# Patient Record
Sex: Female | Born: 1935 | Race: White | Hispanic: No | Marital: Married | Smoking: Never smoker
Health system: Southern US, Community
[De-identification: ages and names within clinical notes are randomized; demographics above are authoritative.]

## PROBLEM LIST (undated history)

## (undated) DIAGNOSIS — G43009 Migraine without aura, not intractable, without status migrainosus: Secondary | ICD-10-CM

## (undated) DIAGNOSIS — I10 Essential (primary) hypertension: Secondary | ICD-10-CM

## (undated) DIAGNOSIS — H3530 Unspecified macular degeneration: Secondary | ICD-10-CM

## (undated) DIAGNOSIS — Z8639 Personal history of other endocrine, nutritional and metabolic disease: Secondary | ICD-10-CM

## (undated) DIAGNOSIS — M810 Age-related osteoporosis without current pathological fracture: Secondary | ICD-10-CM

## (undated) DIAGNOSIS — J45990 Exercise induced bronchospasm: Secondary | ICD-10-CM

## (undated) DIAGNOSIS — Z72820 Sleep deprivation: Secondary | ICD-10-CM

## (undated) DIAGNOSIS — H9190 Unspecified hearing loss, unspecified ear: Secondary | ICD-10-CM

## (undated) HISTORY — DX: Migraine without aura, not intractable, without status migrainosus: G43.009

## (undated) HISTORY — DX: Essential (primary) hypertension: I10

## (undated) HISTORY — DX: Sleep deprivation: Z72.820

## (undated) HISTORY — DX: Personal history of other endocrine, nutritional and metabolic disease: Z86.39

## (undated) HISTORY — DX: Unspecified macular degeneration: H35.30

## (undated) HISTORY — DX: Unspecified hearing loss, unspecified ear: H91.90

## (undated) HISTORY — PX: TONSILLECTOMY: SUR1361

## (undated) HISTORY — DX: Age-related osteoporosis without current pathological fracture: M81.0

## (undated) HISTORY — DX: Exercise induced bronchospasm: J45.990

## (undated) HISTORY — PX: CATARACT EXTRACTION: SUR2

## (undated) HISTORY — PX: ABDOMINAL HYSTERECTOMY: SHX81

---

## 1988-08-01 HISTORY — PX: BREAST LUMPECTOMY: SHX2

## 2000-07-28 ENCOUNTER — Encounter: Payer: Self-pay | Admitting: Orthopedic Surgery

## 2000-07-31 ENCOUNTER — Inpatient Hospital Stay (HOSPITAL_COMMUNITY): Admission: RE | Admit: 2000-07-31 | Discharge: 2000-08-02 | Payer: Self-pay | Admitting: Orthopedic Surgery

## 2000-07-31 HISTORY — PX: OTHER SURGICAL HISTORY: SHX169

## 2002-09-30 ENCOUNTER — Ambulatory Visit (HOSPITAL_COMMUNITY): Admission: RE | Admit: 2002-09-30 | Discharge: 2002-09-30 | Payer: Self-pay | Admitting: Gastroenterology

## 2004-09-06 HISTORY — PX: PARATHYROIDECTOMY: SHX19

## 2005-06-20 ENCOUNTER — Inpatient Hospital Stay (HOSPITAL_COMMUNITY): Admission: EM | Admit: 2005-06-20 | Discharge: 2005-06-21 | Payer: Self-pay | Admitting: Emergency Medicine

## 2006-04-06 ENCOUNTER — Ambulatory Visit (HOSPITAL_COMMUNITY): Admission: RE | Admit: 2006-04-06 | Discharge: 2006-04-06 | Payer: Self-pay | Admitting: Orthopedic Surgery

## 2007-01-03 ENCOUNTER — Ambulatory Visit: Payer: Self-pay | Admitting: Internal Medicine

## 2007-01-24 ENCOUNTER — Ambulatory Visit (HOSPITAL_COMMUNITY): Admission: RE | Admit: 2007-01-24 | Discharge: 2007-01-24 | Payer: Self-pay | Admitting: Internal Medicine

## 2007-02-23 ENCOUNTER — Ambulatory Visit: Payer: Self-pay | Admitting: Internal Medicine

## 2007-04-09 ENCOUNTER — Ambulatory Visit: Payer: Self-pay | Admitting: Internal Medicine

## 2008-05-20 ENCOUNTER — Encounter: Admission: RE | Admit: 2008-05-20 | Discharge: 2008-05-20 | Payer: Self-pay | Admitting: Family Medicine

## 2009-06-02 ENCOUNTER — Encounter: Admission: RE | Admit: 2009-06-02 | Discharge: 2009-06-02 | Payer: Self-pay | Admitting: Family Medicine

## 2009-06-16 ENCOUNTER — Encounter: Admission: RE | Admit: 2009-06-16 | Discharge: 2009-06-16 | Payer: Self-pay | Admitting: Family Medicine

## 2010-06-08 ENCOUNTER — Encounter: Admission: RE | Admit: 2010-06-08 | Discharge: 2010-06-08 | Payer: Self-pay | Admitting: Family Medicine

## 2010-06-22 ENCOUNTER — Ambulatory Visit: Payer: Self-pay | Admitting: Cardiology

## 2010-07-05 ENCOUNTER — Encounter: Payer: Self-pay | Admitting: Cardiology

## 2010-07-05 ENCOUNTER — Ambulatory Visit (HOSPITAL_COMMUNITY)
Admission: RE | Admit: 2010-07-05 | Discharge: 2010-07-05 | Payer: Self-pay | Source: Home / Self Care | Admitting: Cardiology

## 2010-07-05 ENCOUNTER — Ambulatory Visit: Payer: Self-pay

## 2010-07-05 ENCOUNTER — Encounter: Payer: Self-pay | Admitting: Cardiovascular Disease

## 2010-07-15 ENCOUNTER — Ambulatory Visit: Payer: Self-pay | Admitting: Cardiology

## 2010-12-14 NOTE — Assessment & Plan Note (Signed)
[facility]                             PULMONARY OFFICE NOTE   NAME:[name], [name]                        MRN:          [id number]  DATE:04/09/2007                            DOB:          Feb 09, 1933    HISTORY:  A 77 year old white female, never smoker with evidence of air  flow obstruction by previous PFTs returns today having eliminated beta-  blockers from her regimen, having stopped biphosphonates and treating  herself with reflux medicines daily.  She returns for both blood  pressure check and to consider maintenance therapy for asthma.   The patient denies any significant dyspnea with daily activities but on  the other hand, has reduced her activity level significantly over the  years.  She denies any orthopnea, PND or leg swelling, fevers, chills,  sweats or significant nocturnal complaints of wheezing or cough.   MEDICATIONS:  Presently, her only medications consist of  1. Citracal with D 900 mg daily.  2. Glucosamine chondroitin one daily.  3. Exforge 5/160 one daily.   PHYSICAL EXAMINATION:  VITAL SIGNS:  She is afebrile with stable vital  signs.  GENERAL APPEARANCE:  She is a pleasant, ambulatory white female in no  acute distress.  HEENT:  Unremarkable.  Oropharynx clear.  LUNGS:  Completely clear to auscultation and percussion bilaterally.  CARDIOVASCULAR:  There is regular rhythm without murmurs, rubs, or  gallops.  ABDOMEN:  Soft and benign.  EXTREMITIES:  Warm without calf tenderness, clubbing, cyanosis, or  edema.   PFTs were reviewed with the patient from today and indicate and FEV1 of  only 78% of predicted with a ratio of 51%.   IMPRESSION:  This patient has moderate air flow obstruction at baseline  and is at risk for significant asthma exacerbation.  I believe she has  learned to live with this level of air flow obstruction, typical of an  emphysemic patient and needs to be treated more aggressively for asthma.  She  was not inclined to do so, feeling much better than she did when  she first came here (which may have been due to the combined effects of  Actonel in the upper airways and beta-blockers in the lower airways.   To sort through this issue, I recommended the following.  1. Try Symbicort 160 two puffs b.i.d. (extra time spent teaching her      technique) for at least a week to see to what extent she improves.      If we can convince her she improves at that point, I am going to      ask her to continue the medicines indefinitely. Another option      would be to use the medicine the way Europeans do and take it      p.r.n., although this would certainly not be ideal from my      perspective.  2. If she begins to feel significantly improved in terms of her      symptoms, then she can be rechallenged with biphosphonates,      although my strong recommendation is that  she use the one monthly      (ideally once yearly) form of biphosphonate therapy.   I will be happy to see the patient back here if she has any breakthrough  symptoms of coughing, wheezing or dyspnea on the above regimen, but have  deferred all of her regular follow-up including hypertension, which is  well controlled on Exforge, to [facility].   Extended discussion with this patient lasted 15-20 minutes during which  time MDI technique was instructed and mastered by the patient.     Charlaine Dalton. Sherene Sires, MD, [facility]  Electronically Signed    MBW/MedQ  DD: 04/09/2007  DT: 04/10/2007  Job #: [id number]   cc:   Sadie Haber [facility]

## 2010-12-14 NOTE — Assessment & Plan Note (Signed)
[facility]                             PULMONARY OFFICE NOTE   NAME:Biehler, Rorey E                        MRN:          [id number]  DATE:02/23/2007                            DOB:          July 10, 1933    HISTORY:  This is a 77 year old white female never smoker with a history  of asthma as a child now presenting with dyspnea both at rest with the  use of voice and also with exertion. She reported that she improved  either with Primatene or if she just rested and so I was not sure that  she actually had asthma. However, on PFTs she had moderate airflow  obstruction at baseline and a very positive methacholine challenge test.  However, even after bronchodilators, her FEV1 was only 1.40.   She tells me however that she is feeling clinically much better since  she reduced her Metoprolol and eliminated Actonel from her regimen at my  recommendation on 6/4 (this was a short term recommendation only to see  to what extent pseudo-asthma might be playing a role). She denies  exertional chest pain, orthopnea, PND, or leg swelling.   PHYSICAL EXAMINATION:  GENERAL:  She is a pleasant ambulatory white  female in no acute distress.  VITAL SIGNS:  Stable.  HEENT:  Unremarkable. Oropharynx clear.  LUNGS:  Fields reveal pseudo wheeze only. When I asked her to quit  making that voice with your voice if you can, she stopped.  HEART:  Regular rate and rhythm with no murmurs, rubs, or gallops.  ABDOMEN:  Soft and benign.  EXTREMITIES:  Warm without calf tenderness, cyanosis, clubbing, or  edema.   IMPRESSION:  A 15 to 20 minute discussion was held regarding the  following topics:   This patient clearly has airflow obstruction, but probably it is so  chronic that she has really learned to deal with it by limiting her  ventilatory demand. In this setting, a beta blocker should probably be  avoided if possible, so I have given a prescription for Exforge 5/160  and  asked her to stop both Amlodipine and taper off of Metoprolol.  Should she have any sensation of tachycardia or palpitations as she  tapers Metoprolol, I have asked her to go back to the lowest dose of  Metoprolol that controls this.   When I saw her back in six weeks, we will do another set of baseline  PFTs and if indeed she still does have airflow obstruction we will  consider adding an inhaled steroid or a combination product. The problem  with combination products in general and beta agonists in particular, is  that we may find ourselves back where she started, namely with  palpitations (she tells me that is why she had to gradually more and  more Metoprolol anyway, but I believe that was while she was using  inhaled beta agonists).   I spent extra time discussing this with her and her husband openly and  in detail emphasizing that our goal is to optimize her lung function and  minimize the number of medications  she has to take.     Charlaine Dalton. Sherene Sires, MD, [facility]  Electronically Signed    MBW/MedQ  DD: 02/24/2007  DT: 02/25/2007  Job #: [id number]   cc:   Al Decant. Janey Greaser, MD
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
## 2010-12-17 NOTE — H&P (Signed)
[facility]  Patient:    [name], [name] [facility]                          MRN: [id number] Proc. Date: 07/28/00 Adm. Date:  07/31/00 Attending:  Fayrene Fearing P. Aplington, M.D. Dictator:   Della Goo, P.A.-C.                         History and Physical  CHIEF COMPLAINT:  Left shoulder pain.  HISTORY OF PRESENT ILLNESS:  Ms. [name] is a 77 year old white female, with several months history of left shoulder pain.  The left shoulder pain began approximately April of this year when she was doing some chores and reaching backwards and stretching.  She did feel a slight click or pop in the shoulder area, and for several days had muscular discomfort.  She was seen by her medical doctor and treated with Celebrex b.i.d. with little relief.  Her shoulder pain became progressively worse and she noted increasing dysfunction of the left shoulder.  She eventually was seen in Dr. Dawayne Cirri office, and at that time noted to have slight loss of both internal and external rotation actively of the left shoulder.  X-rays of the left shoulder looked fairly normal; however, due to her continued symptoms of pain and ongoing dysfunction, she was sent for an MRI to rule out rotator cuff tear.  Her MRI results did reveal a full-thickness rotator cuff tear on the left with mild retraction.  Due to these significant findings, as well as her continued symptoms, it was felt she would require surgical intervention, and is now being admitted to undergo open repair of the left rotator cuff tear.  ALLERGIES:  PENICILLIN and ERYTHROMYCIN; both cause itching and swelling.  CURRENT MEDICATIONS:  Climara patch.  PAST MEDICAL HISTORY:  The patient states she has had fairly good health throughout her lifetime.  She has recently had a flu syndrome for approximately two weeks and is now recovering.  She has a history of cluster type headaches and has undergone a full evaluation at [facility]. Currently, she uses over-the-counter medication as needed for her headaches.  PAST SURGICAL HISTORY:  Includes tonsillectomy and hysterectomy many years ago.  FAMILY MEDICAL DOCTOR:  Al Decant. Janey Greaser, M.D.  SOCIAL HISTORY:  The patient lives with her husband.  She is a retired Midwife.  She has no intake of alcohol or tobacco.  She is usual independent with activities of daily living, and ordinarily a quite active individual.  FAMILY HISTORY:  The patients father died of an MI.  He also had a history of Parkinsons disease.  The patients mother died of renal failure, and also had a tumor on her parathyroid.  REVIEW OF SYSTEMS:  CENTRAL NERVOUS SYSTEM:  The patient denies blurred vision, double vision, seizure disorder, headaches, or paralysis. CARDIORESPIRATORY:  No chest pain, shortness of breath.  No cough with sputum production; however, she does have some slight cough, mostly left from her previous flu symptoms.  She has no hemoptysis. GENITOURINARY/GASTROINTESTINAL:  No nausea, vomiting, diarrhea, or constipation.  No dysuria, hematuria, melena, or bloody stools. MUSCULOSKELETAL:  As per history of present illness.  HEMATOLOGIC:  The patient denies history of juandice, hepatitis, anemia, bleeding tendencies, or blood clots.  PHYSICAL EXAMINATION:  VITAL SIGNS:  Temperature is afebrile, pulse 72 and regular.  Blood pressure 128/78.  GENERAL:  She is a very pleasant, well-developed, well-nourished  white female, alert and oriented x 4.  She appears somewhat younger than her stated age.  HEENT:  Normocephalic, atraumatic.  Pupils, equal, round, reactive to light and accommodation.  Extraocular movements intact.  Nose without drainage. Oropharynx without edema or erythema.  NECK:  Supple.  No adenopathy or thyromegaly.  LUNGS:  Clear to auscultation.  HEART:  Regular rate and rhythm.  No murmur heard.  ABDOMEN:  Soft, nontender, bowel sounds  present.  GENITALIA/RECTAL/BREASTS:  Examinations not performed, not pertinent to present illness.  EXTREMITIES:  Examination of the left shoulder reveals loss of internal external rotation actively with the left shoulder.  She has mild tenderness at the [facility] joint.  There is very mild weakness with rotator cuff testing of the left shoulder.  Upper extremities showed neurovascular and motor function intact otherwise.  Lower extremities with neurovascular and motor function grossly intact.  IMPRESSION: 1. Rotator cuff tear, left shoulder. 2. History of cluster headaches. 3. Recent flu syndrome.  PLAN:  The patient will be admitted to the hospital to undergo rotator cuff repair of the left shoulder. DD:  07/28/00 TD:  07/29/00 Job: [id number]
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
## 2010-12-17 NOTE — Discharge Summary (Signed)
[facility]  Patient:    [name], [name]               MRN: [id number] Adm. Date:  07/31/00 Disc. Date: 08/02/00 Attending:  Fayrene Fearing P. Aplington, M.D. Dictator:   Druscilla Brownie. Shela Nevin, P.A. CC:         Al Decant. Janey Greaser, M.D.   Discharge Summary  ADMISSION DIAGNOSES: 1. Rotator cuff tear left shoulder. 2. History of cluster headaches. 3. Recent flu syndrome.  DISCHARGE DIAGNOSES: 1. Rotator cuff tear left shoulder. 2. History of cluster headaches. 3. Recent flu syndrome.  OPERATIONS:  On July 31, 2000, the patient underwent anterior acromionectomy repair of torn rotator cuff of the left shoulder.  CONSULTATIONS:  None.  HISTORY OF PRESENT ILLNESS:  This 77 year old lady has had pain and problems of the left shoulder since April 2001.  They have become progressively worse over the months.  MRI showed a full-thickness tear of the anterior rotator cuff.  She had an extensive subdeltoid bursitis and tendinopathy.  Due to the increasing problems she had with use of this left arm, it was felt that she would benefit from the above procedure and was admitted for same.  HOSPITAL COURSE:  The patient tolerated the surgical procedure quite well. Was placed on PCA morphine postoperatively for pain control.  Eventually we were able to wean her from the IV analgesics to p.o. medications, and this seemed to do the job quite well.  On the morning of discharge she was afebrile, vital signs were stable, wound was dry, and Steri-Strips were intact to the left shoulder.  It was felt she could be maintained in the home environment.  Instructions were given both to her and her husband, and she was discharged home.  LABORATORY DATA:  Hematologically showed a CBC with differential completely within normal limits.  Blood chemistries were also normal.  Urinalysis was negative for urinary tract infection.  Chest x-ray showed COPD with no definite acute  process.  EKG showed normal sinus rhythm, left atrial enlargement.  CONDITION ON DISCHARGE:  Improved and stable.  PLAN:  The patient is discharged to her home in the care of her family.  She is urged to relax the shoulder musculature, particularly on the left side. She is to wear the sling at all times.  She may shower in about three to four days.  Use dry dressings p.r.n.  FOLLOW-UP:  Return to the center two weeks after the date of surgery.  DISCHARGE MEDICATIONS: 1. Robaxin 500 mg #40 with a refill, 1 q.6h. p.r.n. muscle spasm. 2. Vicodin 5 mg #40 with a refill, 1-2 q.4-6h. p.r.n. pain.  DISCHARGE INSTRUCTIONS:  Call with any problems. DD:  08/02/00 TD:  08/02/00 Job: [id number]
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
## 2010-12-17 NOTE — Op Note (Signed)
[facility]  Patient:    [name], [name]                MRN: [id number] Proc. Date: 07/31/00 Adm. Date:  [id number] Attending:  Marlowe Kays Page                           Operative Report  PREOPERATIVE DIAGNOSIS:  Full-thickness rotator cuff tear, left shoulder.  POSTOPERATIVE DIAGNOSIS:  Full-thickness rotator cuff tear, left shoulder.  OPERATION:  Anterior acromionectomy and repair of torn rotator cuff, left shoulder.  SURGEON:  Illene Labrador. Aplington, M.D.  ASSISTANT:  Dorie Rank, P.A.C.  ANESTHESIA:  General.  PATHOLOGY AND JUSTIFICATION FOR PROCEDURE:  She has had pain in the left shoulder since April of this year which has become progressively more severe. I saw her on June 02, 2000 in the office and subsequently ordered an MRI of the left shoulder which demonstrated a full-thickness tear of the anterior rotator cuff associated with extensive subdeltoid bursitis and severe tendinopathy.  All these findings were confirmed at surgery.  DESCRIPTION OF PROCEDURE:  With prophylactic antibiotics, satisfactory general anesthesia, placed on the ______ frame in the beach chair position.  Left shoulder girdle was prepped with Duraprep and draped into a sterile field, Ioban employed, vertical incision from roughly the [facility] joint down to the greater tuberosity, opened the fascia over the anterior acromion in line with the skin incision and dissected anteriorly and posteriorly, splitting the deltoid for a centimeter or so.  I then identified the [facility] joint with a Mellody Dance needle and protecting it, marked out my initial line for anterior acromionectomy.  I placed a Cobb elevator beneath the anterior acromion and made my first osteotomy, with additional underneath surface of acromial decompression subsequently to fully decompress the rotator cuff.  I performed a partial subdeltoid bursectomy.  The tear was identified and after evaluating the best method  for repair, felt that I could get a good side-to-side repair without having to reattach it to the bones through drill holes; consequently, working first up underneath the residual anterior acromion, I began a nice side-to-side repair with #1 Ethibond, working laterally down to the greater tuberosity.  This gave a nice stable repair and I checked for impingement and there was no longer any impingement problem.  The wound was then irrigated with sterile saline.  Soft tissue was infiltrated with 0.5% Marcaine with Adrenalin.  The deltoid muscle was closed in two layers with interrupted #1 Vicryl.  The fascia over the anterior acromion was closed with interrupted #1 Vicryl as well, with a nice tight repair.  Subcutaneous tissue was closed with 2-0 Vicryl and skin with Steri-Strips.  A dry sterile dressing and short immobilizer were applied.  At the time of this dictation, she was on the way to the recovery room in satisfactory condition with no known complications. DD:  07/31/00 TD:  07/31/00 Job: [id number]

## 2010-12-17 NOTE — Op Note (Signed)
[name], Young                 ACCOUNT NO.:  [id number]   MEDICAL RECORD NO.:  [id number]          PATIENT TYPE:  AMB   LOCATION:  DAY                          [facility]ACILITY[facility]  [facility]   PHYSICIAN:  Marlowe Kays, M.D.  DATE OF BIRTH:  01-06-1933   DATE OF PROCEDURE:  04/06/2006  DATE OF DISCHARGE:                                 OPERATIVE REPORT   PREOPERATIVE DIAGNOSES:  1. Chronic impingement syndrome with rotator cuff tendinopathy.  2. 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   POSTOPERATIVE DIAGNOSES:  1. Chronic impingement syndrome with rotator cuff tendinopathy.  2. 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   OPERATION:  Right shoulder arthroscopy with:  1. Debridement of articular surface of rotator cuff and minor labral      debridement.  2. Arthroscopic subacromial and distal clavicle resection with shaving of      the bursal surface of the rotator cuff.   SURGEON:  Dr. Simonne Come   ASSISTANT:  Mr. Adrian Blackwater.   ANESTHESIA:  General.   PATHOLOGY AND JUSTIFICATION FOR PROCEDURE:  She has had an open rotator cuff  repair in the left shoulder and done well.  In the right shoulder, she had  rotator cuff tendinopathy with some mild to moderate AC joint degenerative  changes.  This does not appear to be a full-thickness rotator cuff tear.  She was not tender at the [facility] joint.  See operative description below for  additional details.   PROCEDURE:  Satisfactory general anesthesia, beach-chair position on the  Allen frame, right shoulder girdle was prepped with DuraPrep and draped in  sterile field.  The anatomy of the shoulder joint was marked out, and the  posterior and lateral portal sites as was the subacromial space infiltrated  with 0.5% Marcaine with adrenalin.  Through a posterior soft spot portal, I  atraumatically entered the glenohumeral joint.  The underneath surface  of  the rotator cuff was somewhat frayed.  Biceps tendon looked to be generally  in good repair.  There was minimal labral abnormality adjacent to the biceps  insertion.  Accordingly, I advanced the scope between the biceps tendon and  subscapularis.  I used a switching stick and made an anterior incision over  which I placed a metal cannula followed by a 4.2 shaver and debrided down  the rotator cuff and the labrum.  Final pictures were taken.  I then removed  all fluid possible from the anterior portal and redirected the scope in the  subacromial space.  Through a lateral portal, I placed a blunt trocar  followed by a 4.2 shaver.  She had a very exuberant subdeltoid bursitis  which I pictured and then began resecting.  When we had adequate working  room, I used the [employer] 9-degree vaporizer to remove soft tissue from  the undersurface of the anterior acromion and the distal clavicle.  I  followed this with a 4-mm oval bur, burring down these areas, went back and  forth between the 4.2 shaver  which I used to debride up roughening of the  rotator cuff the bur and the [employer] vaporizer until we had wide  decompression of both the distal clavicle and the subacromial area.  Confirmatory pictures were taken with his arm to his side and the arm  abducted.  There was no unusual bleeding at the time of completion.  All  fluid possible was removed from the subacromial space.  The 3  portals and the subacromial space were once again reinjected with 0.5%  Marcaine with adrenalin and the portals closed with 4-0 nylon.  Betadine,  Adaptic dry sterile dressing, shoulder immobilizer were applied.  She  tolerated the procedure well and was taken to recovery in satisfactory  condition with no known complications.           ______________________________  Marlowe Kays, M.D.     JA/MEDQ  D:  04/06/2006  T:  04/06/2006  Job:  [id number]

## 2011-06-14 ENCOUNTER — Other Ambulatory Visit: Payer: Self-pay | Admitting: Family Medicine

## 2011-06-14 DIAGNOSIS — M8580 Other specified disorders of bone density and structure, unspecified site: Secondary | ICD-10-CM

## 2011-06-14 DIAGNOSIS — Z1231 Encounter for screening mammogram for malignant neoplasm of breast: Secondary | ICD-10-CM

## 2011-06-21 ENCOUNTER — Ambulatory Visit
Admission: RE | Admit: 2011-06-21 | Discharge: 2011-06-21 | Disposition: A | Discharge status: Home / Self Care | Payer: Self-pay | Source: Ambulatory Visit

## 2011-06-21 ENCOUNTER — Ambulatory Visit
Admission: RE | Admit: 2011-06-21 | Discharge: 2011-06-21 | Disposition: A | Discharge status: Home / Self Care | Payer: Self-pay | Source: Ambulatory Visit | Attending: Family Medicine | Admitting: Family Medicine

## 2011-06-21 ENCOUNTER — Ambulatory Visit: Payer: Self-pay

## 2011-06-21 DIAGNOSIS — M8580 Other specified disorders of bone density and structure, unspecified site: Secondary | ICD-10-CM

## 2011-06-21 DIAGNOSIS — Z1231 Encounter for screening mammogram for malignant neoplasm of breast: Secondary | ICD-10-CM

## 2011-10-12 ENCOUNTER — Encounter: Payer: Self-pay | Admitting: *Deleted

## 2011-10-12 DIAGNOSIS — I10 Essential (primary) hypertension: Secondary | ICD-10-CM

## 2011-10-12 DIAGNOSIS — J45990 Exercise induced bronchospasm: Secondary | ICD-10-CM | POA: Insufficient documentation

## 2011-10-12 DIAGNOSIS — H353230 Exudative age-related macular degeneration, bilateral, stage unspecified: Secondary | ICD-10-CM | POA: Insufficient documentation

## 2011-10-12 DIAGNOSIS — H3530 Unspecified macular degeneration: Secondary | ICD-10-CM | POA: Insufficient documentation

## 2012-01-04 ENCOUNTER — Encounter: Payer: Self-pay | Admitting: Cardiology

## 2012-03-15 ENCOUNTER — Encounter: Payer: Self-pay | Admitting: *Deleted

## 2012-03-29 ENCOUNTER — Ambulatory Visit (INDEPENDENT_AMBULATORY_CARE_PROVIDER_SITE_OTHER): Payer: Medicare Other | Admitting: Cardiology

## 2012-03-29 ENCOUNTER — Encounter: Payer: Self-pay | Admitting: Cardiology

## 2012-03-29 VITALS — BP 171/77 | HR 64 | Ht 63.0 in | Wt 127.0 lb

## 2012-03-29 DIAGNOSIS — I10 Essential (primary) hypertension: Secondary | ICD-10-CM

## 2012-03-29 DIAGNOSIS — R002 Palpitations: Secondary | ICD-10-CM

## 2012-03-29 MED ORDER — AMLODIPINE BESYLATE 2.5 MG PO TABS: ORAL_TABLET | ORAL | Status: DC

## 2012-03-29 MED ORDER — AMLODIPINE BESYLATE-VALSARTAN 5-160 MG PO TABS: ORAL_TABLET | ORAL | Status: DC

## 2012-03-29 MED ORDER — VALSARTAN 80 MG PO TABS: ORAL_TABLET | ORAL | Status: DC

## 2012-03-29 NOTE — Assessment & Plan Note (Signed)
I think her BP control is reasonable.  She is not getting tired as much on the lower dose of Exforge.  Exforge has been expensive for her.  Today, I gave her prescriptions for amlodipine 2.5 mg daily and valsartan 80 mg daily.  To limit BP fluctuations, I advised her to take one in the morning and one in the evening.

## 2012-03-29 NOTE — Assessment & Plan Note (Signed)
One recent episode where she was tachycardic for about 2 hours.  She had really had no prior episodes for years and has not had symptoms since.  She has known PACs and PVCs by prior holter.  She is not on a beta blocker any longer because of asthma.  Her son has atrial fibrillation and there is a question of a prior atrial fibrillation episode in 2000, but I cannot confirm this.  If she has further episodes of tachypalpitations, I will have her wear a 3 week monitor.  She will call if she has anymore symptoms.    I will see her back in 1 year.  She will have her labs including lipids sent to [place] from her PCP's office when they are done later this year.

## 2012-03-29 NOTE — Progress Notes (Signed)
Patient ID: [name], female   DOB: 06/15/1933, 78 y.o.   MRN: [id number] PCP: Dr. Zachery Dauer  78 yo with history of palpitations and HTN presents for cardiology followup.  She has seen Dr. Deborah Chalk in the past and is seeing me for the first time today.  She was diagnosed by holter with PACs and PVCs back in 2000.  She really has not had much trouble with palpitations since that time.  However, about a week ago she felt her heart beating fast for about 2 hours.  She checked her pulse several times and HR was in the 100s-110s.  The episode spontaneously resolved and has not returned.  She had not felt an episode like this before.  No lightheadedness/presyncope.    Her main concern today is her BP regimen.  Not long ago, she thought that her BP was getting too low. She would have occasional readings in the 90s-100s systolic and would feel tired when BP would be low. Therefore, her Exforge was cut in half so she is taking amlodipine/valsartan 2.5/80 at this time.  She brings in her recent readings and is concerned they are fluctuating too much.  However, systolic is mostly less than 140 and I do not see any systolic readings recently < around 110.  Therefore, I think that her control is reasonable.  BP is high today but she did not take her medication this morning.   No chest pain.  No exertional dyspnea: asthma seems to be controlled with Symbicort.    ECG: NSR, normal   PMH: 1. Palpitations: Holter monitor in 2011 with PACs and PVCs.  ? Episode of paroxysmal atrial fibrillation in 2000.   2. Echo (2011) with EF 55-60%, no significant valvular abnormalities.  3. Hysterectomy 1985 4. Parathyroid surgery 2006. 5. Right breast lumpectomy 1990. 6. H/o rotator cuff surgery.  7. HTN 8. Asthma with exercise-induced component.  Was taken off beta blocker because of asthma.  9. Macular degeneration.   SH: Married, lives in [place].  She is a retired Runner, broadcasting/film/video with 2 sons.  No smoking.   FH: Father died  of heart disease at 36.  One of her sons has been diagnosed with atrial fibrillation.   ROS: All systems reviewed and negative except as per HPI.   Current Outpatient Prescriptions  Medication Sig Dispense Refill  . albuterol (VENTOLIN HFA) 108 (90 BASE) MCG/ACT inhaler Inhale 2 puffs into the lungs every 6 (six) hours as needed.      Marland Kitchen aspirin 81 MG tablet Take 81 mg by mouth daily.      . B Complex Vitamins (VITAMIN B COMPLEX PO) Take 1 tablet by mouth every other day.      . budesonide-formoterol (SYMBICORT) 160-4.5 MCG/ACT inhaler Inhale 2 puffs into the lungs daily.      . calcium citrate-vitamin D (CITRACAL+D) 315-200 MG-UNIT per tablet Take 1 tablet by mouth daily.      . cholecalciferol (VITAMIN D) 1000 UNITS tablet Take 1,000 Units by mouth daily.      . fish oil-omega-3 fatty acids 1000 MG capsule Take 4 g by mouth daily.      . Glucos-Chond-Sterol-Fish Oil (GLUCOSAMINE CHONDROITIN PLUS PO) Take 2 tablets by mouth daily.      . Phosphatidylserine (NEURO-PS PO) Take 1 tablet by mouth daily.      . vitamin C (ASCORBIC ACID) 500 MG tablet Take 500 mg by mouth daily.      . Zolpidem Tartrate (AMBIEN PO) Take by mouth.      Marland Kitchen  DISCONTD: amLODipine-valsartan (EXFORGE) 5-160 MG per tablet Take 1 tablet by mouth daily.      Marland Kitchen amLODipine (NORVASC) 2.5 MG tablet 1 daily in the morning  90 tablet  3  . amLODipine-valsartan (EXFORGE) 5-160 MG per tablet 1/2 tablet daily  45 tablet  3  . valsartan (DIOVAN) 80 MG tablet 1 daily in the evening  90 tablet  3    BP 171/77  Pulse 64  Ht 5\' 3"  (1.6 m)  Wt 127 lb (57.607 kg)  BMI 22.50 kg/m2 General: NAD Neck: No JVD, no thyromegaly or thyroid nodule.  Lungs: Clear to auscultation bilaterally with normal respiratory effort. CV: Nondisplaced PMI.  Heart regular S1/S2, no S3/S4, no murmur.  No peripheral edema.  No carotid bruit.  Normal pedal pulses.  Abdomen: Soft, nontender, no hepatosplenomegaly, no distention.  Skin: Intact without lesions  or rashes.  Neurologic: Alert and oriented x 3.  Psych: Normal affect. Extremities: No clubbing or cyanosis.  HEENT: Normal.

## 2012-03-29 NOTE — Patient Instructions (Addendum)
I have given you a prescription for Exforge 5/160mg  to take 1/2 tablet daily.  I have also given you a prescription for amlodipine 2.5mg  daily in the morning and valsartan 80mg  daily in the evening.   You should take either Exforge 5/160mg  one-half tablet daily OR  amlodipine 2.5mg  1 daily in the morning and valsartan 80mg  1 daily in the evening.   Call the office with your blood pressure readings  if you change to amlodipine 2.5mg  and valsartan 80mg  daily instead of Exforge.  Call if you have an increase in palpitations.  Your physician wants you to follow-up in: 1 year with Dr Shirlee Latch. (August 2014). You will receive a reminder letter in the mail two months in advance. If you don't receive a letter, please call our office to schedule the follow-up appointment.

## 2012-03-30 ENCOUNTER — Other Ambulatory Visit: Payer: Self-pay | Admitting: Family Medicine

## 2012-03-30 DIAGNOSIS — Z139 Encounter for screening, unspecified: Secondary | ICD-10-CM

## 2012-06-15 ENCOUNTER — Ambulatory Visit (INDEPENDENT_AMBULATORY_CARE_PROVIDER_SITE_OTHER): Payer: Medicare Other | Admitting: Family Medicine

## 2012-06-15 ENCOUNTER — Encounter: Payer: Self-pay | Admitting: Family Medicine

## 2012-06-15 VITALS — BP 164/78 | HR 64 | Ht 63.0 in | Wt 126.0 lb

## 2012-06-15 DIAGNOSIS — K648 Other hemorrhoids: Secondary | ICD-10-CM

## 2012-06-15 DIAGNOSIS — E559 Vitamin D deficiency, unspecified: Secondary | ICD-10-CM

## 2012-06-15 DIAGNOSIS — I10 Essential (primary) hypertension: Secondary | ICD-10-CM

## 2012-06-15 DIAGNOSIS — R002 Palpitations: Secondary | ICD-10-CM

## 2012-06-15 DIAGNOSIS — J449 Chronic obstructive pulmonary disease, unspecified: Secondary | ICD-10-CM | POA: Insufficient documentation

## 2012-06-15 NOTE — Patient Instructions (Addendum)
Keep follow-up appointment

## 2012-06-15 NOTE — Progress Notes (Signed)
Subjective:    Patient ID: [name], female    DOB: 08-05-1932, 78 y.o.   MRN: [id number]  HPI She has been living in [place] for years.  Saw [place] docs for years.  They now live about 10 min from her.  They decided good time to make a change.    Dx with COPD and has hx of asthma, but never really has asthma flares. On symbicort 160. Saw Dr. Sherene Sires and has been released. She is currently taking Symbicort and doing well on it. She's had no recent exacerbations. She's happy with her regimen.  She does have a history of vitamin D deficiency. She was told to take 1000 units daily. Her level has not been rechecked recently.  Hypertension-no chest pain or shortness of breath or dizziness recently. She reports that she takes her medications regularly. Recently 2 of her medications were combined to ask for. She's only been taking half a tab because her blood pressure was going too low on a whole tab. Home blood pressures typically run between 120 130. She says his blood pressures very unusual for her. She is fasting today. She would like to go ahead and get her blood work and a she schedule physical for next week.   Review of Systems  Constitutional: Negative for fever, diaphoresis and unexpected weight change.  HENT: Negative for hearing loss, rhinorrhea and tinnitus.   Eyes: Negative for visual disturbance.  Respiratory: Negative for cough and wheezing.   Cardiovascular: Negative for chest pain and palpitations.  Gastrointestinal: Negative for nausea, vomiting, diarrhea and blood in stool.  Genitourinary: Negative for vaginal bleeding, vaginal discharge and difficulty urinating.  Musculoskeletal: Positive for joint swelling and arthralgias. Negative for myalgias.  Skin: Negative for rash.  Neurological: Positive for headaches.  Hematological: Negative for adenopathy. Does not bruise/bleed easily.  Psychiatric/Behavioral: Positive for sleep disturbance. Negative for dysphoric mood. The patient is not  nervous/anxious.     BP 164/78  Pulse 64  Ht 5\' 3"  (1.6 m)  Wt 126 lb (57.153 kg)  BMI 22.32 kg/m2    Allergies  Allergen Reactions  . Erythromycin Swelling  . Penicillins Swelling and Rash    Past Medical History  Diagnosis Date  . HTN (hypertension)   . Asthma, exercise induced   . Macular degeneration   . OP (osteoporosis)   . Difficulty hearing   . Problems related to lack of adequate sleep   . History of parathyroid disease     Past Surgical History  Procedure Date  . Thyroid surgery     Minimally invasive parathyroid surgery by Dr. Bonita Quin in [place], [place].  . Anterior acromionectomy 07/31/00    left  . Repair of torn rotator cuff 07/31/00    left  . Abdominal hysterectomy   . Breast lumpectomy 1990  . Cataract extraction [id number],[id number]    History   Social History  . Marital Status: Married    Spouse Name: N/A    Number of Children: N/A  . Years of Education: N/A   Occupational History  . School Teacher    Social History Main Topics  . Smoking status: Never Smoker   . Smokeless tobacco: Not on file  . Alcohol Use: No  . Drug Use: No  . Sexually Active: Yes   Other Topics Concern  . Not on file   Social History Narrative  . No narrative on file    Family History  Problem Relation Age of Onset  . Heart disease  Father     Outpatient Encounter Prescriptions as of 06/15/2012  Medication Sig Dispense Refill  . albuterol (VENTOLIN HFA) 108 (90 BASE) MCG/ACT inhaler Inhale 2 puffs into the lungs every 6 (six) hours as needed.      Marland Kitchen amLODipine-valsartan (EXFORGE) 5-160 MG per tablet 1/2 tablet daily  45 tablet  3  . aspirin 81 MG tablet Take 81 mg by mouth daily.      . B Complex Vitamins (VITAMIN B COMPLEX PO) Take 1 tablet by mouth every other day.      . budesonide-formoterol (SYMBICORT) 160-4.5 MCG/ACT inhaler Inhale 2 puffs into the lungs daily.      . calcium citrate-vitamin D (CITRACAL+D) 315-200 MG-UNIT per tablet Take 1 tablet by mouth  daily.      . cholecalciferol (VITAMIN D) 1000 UNITS tablet Take 1,000 Units by mouth daily.      . fish oil-omega-3 fatty acids 1000 MG capsule Take 4 g by mouth daily.      . Glucos-Chond-Sterol-Fish Oil (GLUCOSAMINE CHONDROITIN PLUS PO) Take 2 tablets by mouth daily.      . Phosphatidylserine (NEURO-PS PO) Take 1 tablet by mouth daily.      . vitamin C (ASCORBIC ACID) 500 MG tablet Take 500 mg by mouth daily.      Marland Kitchen zolpidem (AMBIEN) 5 MG tablet Take 2.5-5 mg by mouth at bedtime as needed. #30/2 mo      . [DISCONTINUED] amLODipine (NORVASC) 2.5 MG tablet 1 daily in the morning  90 tablet  3  . [DISCONTINUED] valsartan (DIOVAN) 80 MG tablet 1 daily in the evening  90 tablet  3  . [DISCONTINUED] Zolpidem Tartrate (AMBIEN PO) Take by mouth.              Objective:   Physical Exam  Constitutional: She is oriented to person, place, and time. She appears well-developed and well-nourished.  HENT:  Head: Normocephalic and atraumatic.  Cardiovascular: Normal rate, regular rhythm and normal heart sounds.   Pulmonary/Chest: Effort normal and breath sounds normal.  Neurological: She is alert and oriented to person, place, and time.  Skin: Skin is warm and dry.  Psychiatric: She has a normal mood and affect. Her behavior is normal.          Assessment & Plan:  HTN - uncontrolled today. It sounds like her home blood pressures look great. We will recheck her pressure in one week when she follows up. Check CMP and fasting lipid panel today.  COPD-she's stable on her current regimen of Symbicort.  Vitamin D deficiency-recheck levels today. She's currently taking 1000 international units daily.

## 2012-06-16 ENCOUNTER — Encounter: Payer: Self-pay | Admitting: Family Medicine

## 2012-06-16 DIAGNOSIS — C44621 Squamous cell carcinoma of skin of unspecified upper limb, including shoulder: Secondary | ICD-10-CM | POA: Insufficient documentation

## 2012-06-16 DIAGNOSIS — N6019 Diffuse cystic mastopathy of unspecified breast: Secondary | ICD-10-CM | POA: Insufficient documentation

## 2012-06-16 LAB — LIPID PANEL
Cholesterol: 229 mg/dL — ABNORMAL HIGH (ref 0–200)
HDL: 74 mg/dL (ref >39)
LDL Cholesterol: 141 mg/dL — ABNORMAL HIGH (ref 0–99)
Total CHOL/HDL Ratio: 3.1 Ratio
Triglycerides: 71 mg/dL (ref <150)
VLDL: 14 mg/dL (ref 0–40)

## 2012-06-16 LAB — COMPLETE METABOLIC PANEL WITH GFR
ALT: 17 U/L (ref 0–35)
AST: 21 U/L (ref 0–37)
Albumin: 4.4 g/dL (ref 3.5–5.2)
Alkaline Phosphatase: 68 U/L (ref 39–117)
Calcium: 9.8 mg/dL (ref 8.4–10.5)
Chloride: 105 mEq/L (ref 96–112)
Creat: 0.76 mg/dL (ref 0.50–1.10)
GFR, Est African American: 88 mL/min
GFR, Est Non African American: 76 mL/min
Glucose, Bld: 85 mg/dL (ref 70–99)
Potassium: 4.2 mEq/L (ref 3.5–5.3)
Sodium: 144 mEq/L (ref 135–145)
Total Bilirubin: 0.7 mg/dL (ref 0.3–1.2)
Total Protein: 6.5 g/dL (ref 6.0–8.3)

## 2012-06-16 LAB — CBC WITH DIFFERENTIAL/PLATELET
Basophils Relative: 1 % (ref 0–1)
Eosinophils Absolute: 0.1 10*3/uL (ref 0.0–0.7)
Hemoglobin: 14.4 g/dL (ref 12.0–15.0)
Lymphocytes Relative: 28 % (ref 12–46)
Lymphs Abs: 1.6 10*3/uL (ref 0.7–4.0)
MCH: 29.6 pg (ref 26.0–34.0)
MCHC: 33.4 g/dL (ref 30.0–36.0)
MCV: 88.5 fL (ref 78.0–100.0)
Monocytes Absolute: 0.6 10*3/uL (ref 0.1–1.0)
Monocytes Relative: 10 % (ref 3–12)
Neutro Abs: 3.5 10*3/uL (ref 1.7–7.7)
Neutrophils Relative %: 59 % (ref 43–77)
Platelets: 258 10*3/uL (ref 150–400)
RBC: 4.87 MIL/uL (ref 3.87–5.11)
RDW: 13.9 % (ref 11.5–15.5)
WBC: 5.8 10*3/uL (ref 4.0–10.5)

## 2012-06-16 LAB — VITAMIN D 25 HYDROXY (VIT D DEFICIENCY, FRACTURES): Vit D, 25-Hydroxy: 51 ng/mL (ref 30–89)

## 2012-06-18 ENCOUNTER — Encounter: Payer: Self-pay | Admitting: *Deleted

## 2012-06-22 ENCOUNTER — Ambulatory Visit (INDEPENDENT_AMBULATORY_CARE_PROVIDER_SITE_OTHER): Payer: Medicare Other | Admitting: Family Medicine

## 2012-06-22 ENCOUNTER — Encounter: Payer: Self-pay | Admitting: Family Medicine

## 2012-06-22 VITALS — BP 143/76 | HR 74 | Ht 63.0 in | Wt 128.0 lb

## 2012-06-22 DIAGNOSIS — Z0000 Encounter for general adult medical examination without abnormal findings: Secondary | ICD-10-CM

## 2012-06-22 NOTE — Progress Notes (Addendum)
Subjective:    [name] is a 78 y.o. female who presents for Medicare Annual/Subsequent preventive examination.  Preventive Screening-Counseling & Management  Tobacco History  Smoking status  . Never Smoker   Smokeless tobacco  . Not on file     Problems Prior to Visit 1. None   Current Problems (verified) Patient Active Problem List  Diagnosis  . HTN (hypertension)  . Asthma, exercise induced  . Macular degeneration  . Palpitations  . Vitamin D deficiency  . Internal hemorrhoid  . COPD (chronic obstructive pulmonary disease)  . Fibrocystic breast changes  . Squamous cell carcinoma of hand    Medications Prior to Visit Current Outpatient Prescriptions on File Prior to Visit  Medication Sig Dispense Refill  . albuterol (VENTOLIN HFA) 108 (90 BASE) MCG/ACT inhaler Inhale 2 puffs into the lungs every 6 (six) hours as needed.      Marland Kitchen amLODipine-valsartan (EXFORGE) 5-160 MG per tablet 1/2 tablet daily  45 tablet  3  . aspirin 81 MG tablet Take 81 mg by mouth daily.      . B Complex Vitamins (VITAMIN B COMPLEX PO) Take 1 tablet by mouth every other day.      . budesonide-formoterol (SYMBICORT) 160-4.5 MCG/ACT inhaler Inhale 2 puffs into the lungs daily.      . calcium citrate-vitamin D (CITRACAL+D) 315-200 MG-UNIT per tablet Take 1 tablet by mouth daily.      . cholecalciferol (VITAMIN D) 1000 UNITS tablet Take 1,000 Units by mouth daily.      . fish oil-omega-3 fatty acids 1000 MG capsule Take 4 g by mouth daily.      . Glucos-Chond-Sterol-Fish Oil (GLUCOSAMINE CHONDROITIN PLUS PO) Take 2 tablets by mouth daily.      . Phosphatidylserine (NEURO-PS PO) Take 1 tablet by mouth daily.      . vitamin C (ASCORBIC ACID) 500 MG tablet Take 500 mg by mouth daily.      Marland Kitchen zolpidem (AMBIEN) 5 MG tablet Take 2.5-5 mg by mouth at bedtime as needed. #30/2 mo        Current Medications (verified) Current Outpatient Prescriptions  Medication Sig Dispense Refill  . albuterol  (VENTOLIN HFA) 108 (90 BASE) MCG/ACT inhaler Inhale 2 puffs into the lungs every 6 (six) hours as needed.      Marland Kitchen amLODipine-valsartan (EXFORGE) 5-160 MG per tablet 1/2 tablet daily  45 tablet  3  . aspirin 81 MG tablet Take 81 mg by mouth daily.      . B Complex Vitamins (VITAMIN B COMPLEX PO) Take 1 tablet by mouth every other day.      . budesonide-formoterol (SYMBICORT) 160-4.5 MCG/ACT inhaler Inhale 2 puffs into the lungs daily.      . calcium citrate-vitamin D (CITRACAL+D) 315-200 MG-UNIT per tablet Take 1 tablet by mouth daily.      . cholecalciferol (VITAMIN D) 1000 UNITS tablet Take 1,000 Units by mouth daily.      . fish oil-omega-3 fatty acids 1000 MG capsule Take 4 g by mouth daily.      . Glucos-Chond-Sterol-Fish Oil (GLUCOSAMINE CHONDROITIN PLUS PO) Take 2 tablets by mouth daily.      . Phosphatidylserine (NEURO-PS PO) Take 1 tablet by mouth daily.      . vitamin C (ASCORBIC ACID) 500 MG tablet Take 500 mg by mouth daily.      Marland Kitchen zolpidem (AMBIEN) 5 MG tablet Take 2.5-5 mg by mouth at bedtime as needed. #30/2 mo  Allergies (verified) Erythromycin and Penicillins   PAST HISTORY  Family History Family History  Problem Relation Age of Onset  . Heart disease Father     Social History History  Substance Use Topics  . Smoking status: Never Smoker   . Smokeless tobacco: Not on file  . Alcohol Use: No     Are there smokers in your home (other than you)? No  Risk Factors Current exercise habits: works in the yard.   Dietary issues discussed: healthy diet   Cardiac risk factors: advanced age (older than 19 for men, 2 for women).  Depression Screen (Note: if answer to either of the following is "Yes", a more complete depression screening is indicated)   Over the past two weeks, have you felt down, depressed or hopeless? No  Over the past two weeks, have you felt little interest or pleasure in doing things? No  Have you lost interest or pleasure in daily life?  No  Do you often feel hopeless? No  Do you cry easily over simple problems? No  Activities of Daily Living In your present state of health, do you have any difficulty performing the following activities?:  Driving? No Managing money?  No Feeding yourself? No Getting from bed to chair? No Climbing a flight of stairs? No Preparing food and eating?: No Bathing or showering? No Getting dressed: No Getting to the toilet? No Using the toilet:No Moving around from place to place: No In the past year have you fallen or had a near fall?:No   Are you sexually active?  Yes  Do you have more than one partner?  No  Hearing Difficulties: Yes Do you often ask people to speak up or repeat themselves? No Do you experience ringing or noises in your ears? Yes Do you have difficulty understanding soft or whispered voices? Yes   Do you feel that you have a problem with memory? No  Do you often misplace items? No  Do you feel safe at home?  No  Cognitive Testing  Alert? Yes  Normal Appearance?Yes  Oriented to person? Yes  Place? Yes   Time? Yes  Recall of three objects?  Yes  Can perform simple calculations? Yes  Displays appropriate judgment?Yes  Can read the correct time from a watch face?Yes   Advanced Directives have been discussed with the patient? Yes  List the Names of Other Physician/Practitioners you currently use: 1.  Dr. Corrin Parker, dermatology #2 Dr. Clearance Coots optometry #3 Dr. Christia Reading, ENT. #4 Dr. Ernest Haber, chiropractor  Indicate any recent Medical Services you may have received from other than [facility] providers in the past year (date may be approximate).  Immunization History  Administered Date(s) Administered  . Influenza Split 05/07/2012  . Pneumococcal Polysaccharide 08/01/2005  . Td 08/01/2001  . Tdap 08/01/2005    Screening Tests Health Maintenance  Topic Date Due  . Mammogram  06/20/2012  . Colonoscopy  09/29/2012  . Influenza Vaccine  04/01/2013  .  Tetanus/tdap  08/02/2015  . Zostavax  11/03/1994  . Pneumococcal Polysaccharide Vaccine Age 66 And Over  Completed    All answers were reviewed with the patient and necessary referrals were made:  Roger Kettles, MD   06/22/2012   History reviewed: allergies, current medications, past family history, past medical history, past social history, past surgical history and problem list  Review of Systems A comprehensive review of systems was negative.    Objective:     Vision by Snellen chart: right eye:20/20, left eye:20/200  Body mass index is 22.67 kg/(m^2). BP 143/76  Pulse 74  Ht 5\' 3"  (1.6 m)  Wt 128 lb (58.06 kg)  BMI 22.67 kg/m2  BP 143/76  Pulse 74  Ht 5\' 3"  (1.6 m)  Wt 128 lb (58.06 kg)  BMI 22.67 kg/m2  General Appearance:    Alert, cooperative, no distress, appears stated age  Head:    Normocephalic, without obvious abnormality, atraumatic  Eyes:    PERRL, conjunctiva/corneas clear, EOM's intact, both eyes  Ears:    Normal TM's and external ear canals, both ears  Nose:   Nares normal, septum midline, mucosa normal, no drainage    or sinus tenderness  Throat:   Lips, mucosa, and tongue normal; teeth and gums normal  Neck:   Supple, symmetrical, trachea midline, no adenopathy;    thyroid:  no enlargement/tenderness/nodules; no carotid   bruit or JVD  Back:     Symmetric, no curvature, ROM normal, no CVA tenderness  Lungs:     Clear to auscultation bilaterally, respirations unlabored  Chest Wall:    No tenderness or deformity   Heart:    Regular rate and rhythm, S1 and S2 normal, no murmur, rub   or gallop  Breast Exam:    Not performed.   Abdomen:     Soft, non-tender, bowel sounds active all four quadrants,    no masses, no organomegaly  Genitalia:    Not performed.   Rectal:    Not performed.   Extremities:   Extremities normal, atraumatic, no cyanosis or edema  Pulses:   2+ and symmetric all extremities  Skin:   Skin color, texture, turgor normal, no rashes  or lesions  Lymph nodes:   Cervical, supraclavicular, and axillary nodes normal  Neurologic:   CNII-XII intact, normal strength, sensation and reflexes    throughout       Assessment:     Annual Wellness Exam     Plan:     During the course of the visit the patient was educated and counseled about appropriate screening and preventive services including:    singles vaccine - she declines at this time.    Had her eye exam this month. She has lens implant, one for distance and one for near.   Given samples of symbicort. Has his her medicare gap until Jan.   Mammo scheduled for xt week.   Given copy of labwork.   Diet review for nutrition referral? Yes ____  Not Indicated __x__   Patient Instructions (the written plan) was given to the patient.  Medicare Attestation I have personally reviewed: The patient's medical and social history Their use of alcohol, tobacco or illicit drugs Their current medications and supplements The patient's functional ability including ADLs,fall risks, home safety risks, cognitive, and hearing and visual impairment Diet and physical activities Evidence for depression or mood disorders  The patient's weight, height, BMI, and visual acuity have been recorded in the chart.  I have made referrals, counseling, and provided education to the patient based on review of the above and I have provided the patient with a written personalized care plan for preventive services.     Jonny Longino, MD   06/22/2012

## 2012-06-22 NOTE — Patient Instructions (Signed)
Keep up a regular exercise program and make sure you are eating a healthy diet Try to eat 4 servings of dairy a day, or if you are lactose intolerant take a calcium with vitamin D daily.  Your vaccines are up to date.   

## 2012-06-26 ENCOUNTER — Ambulatory Visit: Payer: Medicare Other

## 2012-07-03 ENCOUNTER — Ambulatory Visit (INDEPENDENT_AMBULATORY_CARE_PROVIDER_SITE_OTHER): Payer: Medicare Other

## 2012-07-03 DIAGNOSIS — Z1231 Encounter for screening mammogram for malignant neoplasm of breast: Secondary | ICD-10-CM

## 2012-07-10 ENCOUNTER — Other Ambulatory Visit: Payer: Self-pay | Admitting: *Deleted

## 2012-08-17 ENCOUNTER — Other Ambulatory Visit: Payer: Self-pay | Admitting: *Deleted

## 2012-08-17 MED ORDER — BUDESONIDE-FORMOTEROL FUMARATE 160-4.5 MCG/ACT IN AERO: 2.0000 | INHALATION_SPRAY | Freq: Every day | RESPIRATORY_TRACT | Status: DC

## 2012-08-23 ENCOUNTER — Ambulatory Visit (INDEPENDENT_AMBULATORY_CARE_PROVIDER_SITE_OTHER): Payer: Medicare Other | Admitting: Family Medicine

## 2012-08-23 ENCOUNTER — Encounter: Payer: Self-pay | Admitting: Family Medicine

## 2012-08-23 VITALS — BP 147/76 | HR 77 | Wt 130.0 lb

## 2012-08-23 DIAGNOSIS — G4700 Insomnia, unspecified: Secondary | ICD-10-CM

## 2012-08-23 DIAGNOSIS — I10 Essential (primary) hypertension: Secondary | ICD-10-CM

## 2012-08-23 MED ORDER — ALPRAZOLAM 0.5 MG PO TABS: 0.5000 mg | ORAL_TABLET | Freq: Every evening | ORAL | Status: DC | PRN

## 2012-08-23 MED ORDER — LOSARTAN POTASSIUM 50 MG PO TABS: 50.0000 mg | ORAL_TABLET | Freq: Every day | ORAL | Status: DC

## 2012-08-23 MED ORDER — AMLODIPINE BESYLATE 2.5 MG PO TABS: 2.5000 mg | ORAL_TABLET | Freq: Every day | ORAL | Status: DC

## 2012-08-23 NOTE — Progress Notes (Signed)
  Subjective:    Patient ID: [name], female    DOB: July 03, 1933, 79 y.o.   MRN: [id number]  HPI HTN- has been folowing BP at hom.  BPS have been up at home. If she gets anxious or angry it will spike. Runnin from upper teens to 180s.    Insomnia - Has a hard time falling alsspe and then will wake up at 3AM.  Says has to take her sleeping pill. Has been using Ambien every other night. Says often will wake up at 3AM to urinate and then will take an [place]. 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 Even though her mother passed away several years ago she still has difficulty and wakes up typically in the middle the night and in her mind starts racing she is unable to go back to sleep. Most of the time she's able to fall asleep initially at night without much difficulty. Only occasionally is bothersome. She denies any caffeine intake. She met she's been eating a little bit more chocolate than usual but nothing in the evenings.   Review of Systems     Objective:   Physical Exam  Constitutional: She is oriented to person, place, and time. She appears well-developed and well-nourished.  HENT:  Head: Normocephalic and atraumatic.  Cardiovascular: Normal rate, regular rhythm and normal heart sounds.   Pulmonary/Chest: Effort normal and breath sounds normal.  Neurological: She is alert and oriented to person, place, and time.  Skin: Skin is warm and dry.  Psychiatric: She has a normal mood and affect. Her behavior is normal.          Assessment & Plan:  HTN- Wil Change her to losartan and amlodipine seperately.  Make sure avoiding excess salt. Given H.o. On low salt diet.  Followup in one month to recheck blood pressure.  Insomnia - Doing well on Ambien. Taking half a tab every other night. Warned her not to take her abimen at 3 AM.  I explained this is unsafe and can cause significant  sedation impairment up until noon. I do not feel that this is a safe way to take this medication. We discussed maybe using Xanax as has a shorter acting half-life. Did warn about still addiction potential and sedation potential with this medication. Part of that makes it difficult for her fall back asleep at 3 AM is her mind starts to race. So hopefully this will help relax her and she can go back to sleep.

## 2012-08-23 NOTE — Patient Instructions (Addendum)
1.5 Gram Low Sodium Diet A 1.5 gram sodium diet restricts the amount of sodium in the diet to no more than 1.5 g or 1500 mg daily. The American Heart Association recommends Americans over the age of 20 to consume no more than 1500 mg of sodium each day to reduce the risk of developing high blood pressure. Research also shows that limiting sodium may reduce heart attack and stroke risk. Many foods contain sodium for flavor and sometimes as a preservative. When the amount of sodium in a diet needs to be low, it is important to know what to look for when choosing foods and drinks. The following includes some information and guidelines to help make it easier for you to adapt to a low sodium diet. QUICK TIPS  Do not add salt to food.   Avoid convenience items and fast food.   Choose unsalted snack foods.   Buy lower sodium products, often labeled as "lower sodium" or "no salt added."   Check food labels to learn how much sodium is in 1 serving.   When eating at a restaurant, ask that your food be prepared with less salt or none, if possible.  READING FOOD LABELS FOR SODIUM INFORMATION The nutrition facts label is a good place to find how much sodium is in foods. Look for products with no more than 400 mg of sodium per serving. Remember that 1.5 g = 1500 mg. The food label may also list foods as:  Sodium-free: Less than 5 mg in a serving.   Very low sodium: 35 mg or less in a serving.   Low-sodium: 140 mg or less in a serving.   Light in sodium: 50% less sodium in a serving. For example, if a food that usually has 300 mg of sodium is changed to become light in sodium, it will have 150 mg of sodium.   Reduced sodium: 25% less sodium in a serving. For example, if a food that usually has 400 mg of sodium is changed to reduced sodium, it will have 300 mg of sodium.  CHOOSING FOODS Grains  Avoid: Salted crackers and snack items. Some cereals, including instant hot cereals. Bread stuffing and  biscuit mixes. Seasoned rice or pasta mixes.   Choose: Unsalted snack items. Low-sodium cereals, oats, puffed wheat and rice, shredded wheat. English muffins and bread. Pasta.  Meats  Avoid: Salted, canned, smoked, spiced, pickled meats, including fish and poultry. Bacon, ham, sausage, cold cuts, hot dogs, anchovies.   Choose: Low-sodium canned tuna and salmon. Fresh or frozen meat, poultry, and fish.  Dairy  Avoid: Processed cheese and spreads. Cottage cheese. Buttermilk and condensed milk. Regular cheese.   Choose: Milk. Low-sodium cottage cheese. Yogurt. Sour cream. Low-sodium cheese.  Fruits and Vegetables  Avoid: Regular canned vegetables. Regular canned tomato sauce and paste. Frozen vegetables in sauces. Olives. Pickles. Relishes. Sauerkraut.   Choose: Low-sodium canned vegetables. Low-sodium tomato sauce and paste. Frozen or fresh vegetables. Fresh and frozen fruit.  Condiments  Avoid: Canned and packaged gravies. Worcestershire sauce. Tartar sauce. Barbecue sauce. Soy sauce. Steak sauce. Ketchup. Onion, garlic, and table salt. Meat flavorings and tenderizers.   Choose: Fresh and dried herbs and spices. Low-sodium varieties of mustard and ketchup. Lemon juice. Tabasco sauce. Horseradish.  SAMPLE 1.5 GRAM SODIUM MEAL PLAN Breakfast / Sodium (mg)  1 cup low-fat milk / 143 mg   1 whole-wheat English muffin / 240 mg   1 tbs heart-healthy margarine / 153 mg   1 hard-boiled egg /   139 mg   1 small orange / 0 mg  Lunch / Sodium (mg)  1 cup raw carrots / 76 mg   2 tbs no salt added peanut butter / 5 mg   2 slices whole-wheat bread / 270 mg   1 tbs jelly / 6 mg    cup red grapes / 2 mg  Dinner / Sodium (mg)  1 cup whole-wheat pasta / 2 mg   1 cup low-sodium tomato sauce / 73 mg   3 oz lean ground beef / 57 mg   1 small side salad (1 cup raw spinach leaves,  cup cucumber,  cup yellow bell pepper) with 1 tsp olive oil and 1 tsp red wine vinegar / 25 mg  Snack /  Sodium (mg)  1 container low-fat vanilla yogurt / 107 mg   3 graham cracker squares / 127 mg  Nutrient Analysis  Calories: 1745   Protein: 75 g   Carbohydrate: 237 g   Fat: 57 g   Sodium: 1425 mg  Document Released: 07/18/2005 Document Revised: 10/10/2011 Document Reviewed: 10/19/2009 [facility] Patient Information 2013 [place], [place].   Insomnia Insomnia is frequent trouble falling and/or staying asleep. Insomnia can be a long term problem or a short term problem. Both are common. Insomnia can be a short term problem when the wakefulness is related to a certain stress or worry. Long term insomnia is often related to ongoing stress during waking hours and/or poor sleeping habits. Overtime, sleep deprivation itself can make the problem worse. Every little thing feels more severe because you are overtired and your ability to cope is decreased. CAUSES    Stress, anxiety, and depression.   Poor sleeping habits.   Distractions such as TV in the bedroom.   Naps close to bedtime.   Engaging in emotionally charged conversations before bed.   Technical reading before sleep.   Alcohol and other sedatives. They may make the problem worse. They can hurt normal sleep patterns and normal dream activity.   Stimulants such as caffeine for several hours prior to bedtime.   Pain syndromes and shortness of breath can cause insomnia.   Exercise late at night.   Changing time zones may cause sleeping problems (jet lag).  It is sometimes helpful to have someone observe your sleeping patterns. They should look for periods of not breathing during the night (sleep apnea). They should also look to see how long those periods last. If you live alone or observers are uncertain, you can also be observed at a sleep clinic where your sleep patterns will be professionally monitored. Sleep apnea requires a checkup and treatment. Give your caregivers your medical history. Give your caregivers observations  your family has made about your sleep.   SYMPTOMS    Not feeling rested in the morning.   Anxiety and restlessness at bedtime.   Difficulty falling and staying asleep.  TREATMENT    Your caregiver may prescribe treatment for an underlying medical disorders. Your caregiver can give advice or help if you are using alcohol or other drugs for self-medication. Treatment of underlying problems will usually eliminate insomnia problems.   Medications can be prescribed for short time use. They are generally not recommended for lengthy use.   Over-the-counter sleep medicines are not recommended for lengthy use. They can be habit forming.   You can promote easier sleeping by making lifestyle changes such as:   Using relaxation techniques that help with breathing and reduce muscle tension.   Exercising  earlier in the day.   Changing your diet and the time of your last meal. No night time snacks.   Establish a regular time to go to bed.   Counseling can help with stressful problems and worry.   Soothing music and white noise may be helpful if there are background noises you cannot remove.   Stop tedious detailed work at least one hour before bedtime.  HOME CARE INSTRUCTIONS    Keep a diary. Inform your caregiver about your progress. This includes any medication side effects. See your caregiver regularly. Take note of:   Times when you are asleep.   Times when you are awake during the night.   The quality of your sleep.   How you feel the next day.  This information will help your caregiver care for you.  Get out of bed if you are still awake after 15 minutes. Read or do some quiet activity. Keep the lights down. Wait until you feel sleepy and go back to bed.   Keep regular sleeping and waking hours. Avoid naps.   Exercise regularly.   Avoid distractions at bedtime. Distractions include watching television or engaging in any intense or detailed activity like attempting to balance  the household checkbook.   Develop a bedtime ritual. Keep a familiar routine of bathing, brushing your teeth, climbing into bed at the same time each night, listening to soothing music. Routines increase the success of falling to sleep faster.   Use relaxation techniques. This can be using breathing and muscle tension release routines. It can also include visualizing peaceful scenes. You can also help control troubling or intruding thoughts by keeping your mind occupied with boring or repetitive thoughts like the old concept of counting sheep. You can make it more creative like imagining planting one beautiful flower after another in your backyard garden.   During your day, work to eliminate stress. When this is not possible use some of the previous suggestions to help reduce the anxiety that accompanies stressful situations.  MAKE SURE YOU:    Understand these instructions.   Will watch your condition.   Will get help right away if you are not doing well or get worse.  Document Released: 07/15/2000 Document Revised: 10/10/2011 Document Reviewed: 08/15/2007 [facility] Patient Information 2013 [place], [place].

## 2012-09-20 ENCOUNTER — Ambulatory Visit (INDEPENDENT_AMBULATORY_CARE_PROVIDER_SITE_OTHER): Payer: Medicare Other | Admitting: Family Medicine

## 2012-09-20 ENCOUNTER — Encounter: Payer: Self-pay | Admitting: Family Medicine

## 2012-09-20 VITALS — BP 125/69 | HR 65 | Ht 63.5 in | Wt 131.0 lb

## 2012-09-20 DIAGNOSIS — I10 Essential (primary) hypertension: Secondary | ICD-10-CM

## 2012-09-20 MED ORDER — AMLODIPINE BESYLATE 2.5 MG PO TABS: 2.5000 mg | ORAL_TABLET | Freq: Every day | ORAL | Status: DC

## 2012-09-20 MED ORDER — LOSARTAN POTASSIUM 50 MG PO TABS: 50.0000 mg | ORAL_TABLET | Freq: Every day | ORAL | Status: DC

## 2012-09-20 NOTE — Progress Notes (Signed)
  Subjective:    Patient ID: [name], female    DOB: 05/03/1933, 79 y.o.   MRN: [id number]  HPI We changed her exforge to amlodipine and losartan for cost reasons. Home BPs have been running 104-164, but most numbers look great. Really only had 2 elevated numbers on her BP log. Has been feeling well on it.   She also wants and if she can start a multivitamin. This is absolutely reasonable. Next  She recently saw her dermatologist and had a lesion removed from her face under her left eye. She got a call back today saying that it was a benign lesion. She still has a bandage over it and says it is healing well. Review of Systems     Objective:   Physical Exam  Constitutional: She is oriented to person, place, and time. She appears well-developed and well-nourished.  HENT:  Head: Normocephalic and atraumatic.  Cardiovascular: Normal rate, regular rhythm and normal heart sounds.   Pulmonary/Chest: Effort normal and breath sounds normal.  Neurological: She is alert and oriented to person, place, and time.  Skin: Skin is warm and dry.  Psychiatric: She has a normal mood and affect. Her behavior is normal.          Assessment & Plan:  HTN- Well controlled on her new regimen. New prescriptions sent to the pharmacy.Marland Kitchen Has been exercising 3 days per week.  F/u in 4 months. Call if any palms or concerns with her medication. She does not need to continue to check her pressure daily but occasionally just to keep an eye on is reasonable also check on days where she doesn't feel well.

## 2012-10-29 ENCOUNTER — Encounter: Payer: Self-pay | Admitting: Family Medicine

## 2012-11-30 ENCOUNTER — Telehealth: Payer: Self-pay | Admitting: Cardiology

## 2012-11-30 NOTE — Telephone Encounter (Signed)
New Problem:    Patient called in in response to a recall letter she received.  Decline to schedule an appointment at the time of the call.  Patient stated that she does not wishto be seen until an issue arises and that her medications are now being filled through Dr. Shelah Lewandowsky office.

## 2013-01-23 ENCOUNTER — Encounter: Payer: Self-pay | Admitting: Family Medicine

## 2013-01-23 ENCOUNTER — Ambulatory Visit (INDEPENDENT_AMBULATORY_CARE_PROVIDER_SITE_OTHER): Payer: Medicare Other | Admitting: Family Medicine

## 2013-01-23 VITALS — BP 136/73 | HR 67 | Wt 128.0 lb

## 2013-01-23 DIAGNOSIS — J449 Chronic obstructive pulmonary disease, unspecified: Secondary | ICD-10-CM

## 2013-01-23 DIAGNOSIS — I10 Essential (primary) hypertension: Secondary | ICD-10-CM

## 2013-01-23 LAB — BASIC METABOLIC PANEL WITH GFR
CO2: 29 mEq/L (ref 19–32)
Chloride: 105 mEq/L (ref 96–112)
Potassium: 4.2 mEq/L (ref 3.5–5.3)
Sodium: 143 mEq/L (ref 135–145)

## 2013-01-23 MED ORDER — LOSARTAN POTASSIUM 50 MG PO TABS: 50.0000 mg | ORAL_TABLET | Freq: Every day | ORAL | Status: DC

## 2013-01-23 MED ORDER — AMLODIPINE BESYLATE 2.5 MG PO TABS: 2.5000 mg | ORAL_TABLET | Freq: Every day | ORAL | Status: DC

## 2013-01-23 MED ORDER — ALPRAZOLAM 0.5 MG PO TABS: 0.5000 mg | ORAL_TABLET | Freq: Every evening | ORAL | Status: DC | PRN

## 2013-01-23 MED ORDER — BUDESONIDE-FORMOTEROL FUMARATE 160-4.5 MCG/ACT IN AERO: 2.0000 | INHALATION_SPRAY | Freq: Every day | RESPIRATORY_TRACT | Status: DC

## 2013-01-23 NOTE — Progress Notes (Signed)
  Subjective:    Patient ID: [name], female    DOB: [birth year], 79 y.o.   MRN: [id number]  HPI Here to followup on blood pressure. Home blood pressures have been running approximately 104- 117/58-68.  Pt denies chest pain, SOB, dizziness, or heart palpitations.  Taking meds as directed w/o problems.  Denies medication side effects.  Would like 90 supply on her medications if possible.  COPD - Dong well on symbicort.  No flares in her COPD. Still working out in her yard.    She did have her yearly dermatology appointment in April.  Insomnia  Using her xanax sparingly. Has only used 15 tabs in 6 months.    Had her colonoscopy in March.  Review of Systems     Objective:   Physical Exam  Constitutional: She is oriented to person, place, and time. She appears well-developed and well-nourished.  HENT:  Head: Normocephalic and atraumatic.  Cardiovascular: Normal rate, regular rhythm and normal heart sounds.   Pulmonary/Chest: Effort normal and breath sounds normal.  Neurological: She is alert and oriented to person, place, and time.  Skin: Skin is warm and dry.  Psychiatric: She has a normal mood and affect. Her behavior is normal.          Assessment & Plan:  HTN- well controlled. F/U in 6 months.  COPD- Well controlled.    Insomnia - Well controlled. Uses prn xanax and ambien. Tries to minimize ambien.

## 2013-01-24 NOTE — Progress Notes (Signed)
Quick Note:  All labs are normal. ______ 

## 2013-02-19 ENCOUNTER — Other Ambulatory Visit: Payer: Self-pay | Admitting: *Deleted

## 2013-02-19 MED ORDER — LOSARTAN POTASSIUM 50 MG PO TABS: 50.0000 mg | ORAL_TABLET | Freq: Every day | ORAL | Status: DC

## 2013-02-19 MED ORDER — AMLODIPINE BESYLATE 2.5 MG PO TABS: 2.5000 mg | ORAL_TABLET | Freq: Every day | ORAL | Status: DC

## 2013-03-20 ENCOUNTER — Other Ambulatory Visit: Payer: Self-pay

## 2013-06-24 ENCOUNTER — Encounter: Payer: Self-pay | Admitting: Family Medicine

## 2013-06-24 ENCOUNTER — Ambulatory Visit (INDEPENDENT_AMBULATORY_CARE_PROVIDER_SITE_OTHER): Payer: Medicare Other | Admitting: Family Medicine

## 2013-06-24 ENCOUNTER — Other Ambulatory Visit: Payer: Self-pay | Admitting: Family Medicine

## 2013-06-24 ENCOUNTER — Other Ambulatory Visit: Payer: Self-pay | Admitting: *Deleted

## 2013-06-24 VITALS — BP 137/82 | HR 73 | Temp 98.3°F | Ht 63.6 in | Wt 125.0 lb

## 2013-06-24 DIAGNOSIS — Z1231 Encounter for screening mammogram for malignant neoplasm of breast: Secondary | ICD-10-CM

## 2013-06-24 DIAGNOSIS — I10 Essential (primary) hypertension: Secondary | ICD-10-CM

## 2013-06-24 DIAGNOSIS — Z0000 Encounter for general adult medical examination without abnormal findings: Secondary | ICD-10-CM

## 2013-06-24 LAB — COMPLETE METABOLIC PANEL WITH GFR
ALT: 20 U/L (ref 0–35)
Albumin: 4.1 g/dL (ref 3.5–5.2)
CO2: 29 mEq/L (ref 19–32)
GFR, Est African American: 82 mL/min
GFR, Est Non African American: 71 mL/min
Glucose, Bld: 84 mg/dL (ref 70–99)
Potassium: 4.2 mEq/L (ref 3.5–5.3)
Sodium: 140 mEq/L (ref 135–145)
Total Protein: 6.6 g/dL (ref 6.0–8.3)

## 2013-06-24 LAB — LIPID PANEL
LDL Cholesterol: 116 mg/dL — ABNORMAL HIGH (ref 0–99)
Total CHOL/HDL Ratio: 2.8 Ratio

## 2013-06-24 LAB — TSH: TSH: 1.341 u[IU]/mL (ref 0.350–4.500)

## 2013-06-24 MED ORDER — ALPRAZOLAM 0.5 MG PO TABS: 0.5000 mg | ORAL_TABLET | Freq: Every evening | ORAL | Status: DC | PRN

## 2013-06-24 NOTE — Progress Notes (Signed)
Subjective:    [name] is a 79 y.o. female who presents for Medicare Annual/Subsequent preventive examination.  Preventive Screening-Counseling & Management  Tobacco History  Smoking status  . Never Smoker   Smokeless tobacco  . Not on file     Problems Prior to Visit 1.   Current Problems (verified) Patient Active Problem List   Diagnosis Date Noted  . Fibrocystic breast changes 06/16/2012  . 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 Vitamin D deficiency 06/15/2012  . Internal hemorrhoid 06/15/2012  . COPD (chronic obstructive pulmonary disease) 06/15/2012  . Palpitations 03/29/2012  . HTN (hypertension)   . Asthma, exercise induced   . Macular degeneration     Medications Prior to Visit Current Outpatient Prescriptions on File Prior to Visit  Medication Sig Dispense Refill  . albuterol (VENTOLIN HFA) 108 (90 BASE) MCG/ACT inhaler Inhale 2 puffs into the lungs every 6 (six) hours as needed.      . ALPRAZolam (XANAX) 0.5 MG tablet Take 1 tablet (0.5 mg total) by mouth at bedtime as needed for sleep.  30 tablet  1  . amLODipine (NORVASC) 2.5 MG tablet Take 1 tablet (2.5 mg total) by mouth daily.  90 tablet  3  . aspirin 81 MG tablet Take 81 mg by mouth daily.      . B Complex Vitamins (VITAMIN B COMPLEX PO) Take 1 tablet by mouth every other day.      . budesonide-formoterol (SYMBICORT) 160-4.5 MCG/ACT inhaler Inhale 2 puffs into the lungs daily.  3 Inhaler  3  . calcium citrate-vitamin D (CITRACAL+D) 315-200 MG-UNIT per tablet Take 1 tablet by mouth daily.      . cholecalciferol (VITAMIN D) 1000 UNITS tablet Take 1,000 Units by mouth daily.      . fish oil-omega-3 fatty acids 1000 MG capsule Take 4 g by mouth daily.      . Glucos-Chond-Sterol-Fish Oil (GLUCOSAMINE CHONDROITIN PLUS PO) Take 2 tablets by mouth daily.      Marland Kitchen losartan (COZAAR) 50 MG tablet Take 1 tablet (50 mg total) by mouth daily.  90 tablet  3  . Multiple Vitamins-Minerals (PRESERVISION AREDS  2) CAPS Take 1 tablet by mouth 2 (two) times daily.      . Phosphatidylserine (NEURO-PS PO) Take 1 tablet by mouth daily.      . vitamin C (ASCORBIC ACID) 500 MG tablet Take 500 mg by mouth daily.       No current facility-administered medications on file prior to visit.    Current Medications (verified) Current Outpatient Prescriptions  Medication Sig Dispense Refill  . albuterol (VENTOLIN HFA) 108 (90 BASE) MCG/ACT inhaler Inhale 2 puffs into the lungs every 6 (six) hours as needed.      . ALPRAZolam (XANAX) 0.5 MG tablet Take 1 tablet (0.5 mg total) by mouth at bedtime as needed for sleep.  30 tablet  1  . amLODipine (NORVASC) 2.5 MG tablet Take 1 tablet (2.5 mg total) by mouth daily.  90 tablet  3  . aspirin 81 MG tablet Take 81 mg by mouth daily.      . B Complex Vitamins (VITAMIN B COMPLEX PO) Take 1 tablet by mouth every other day.      . budesonide-formoterol (SYMBICORT) 160-4.5 MCG/ACT inhaler Inhale 2 puffs into the lungs daily.  3 Inhaler  3  . calcium citrate-vitamin D (CITRACAL+D) 315-200 MG-UNIT per tablet Take 1 tablet by mouth daily.      . cholecalciferol (VITAMIN D) 1000  UNITS tablet Take 1,000 Units by mouth daily.      . fish oil-omega-3 fatty acids 1000 MG capsule Take 4 g by mouth daily.      . Glucos-Chond-Sterol-Fish Oil (GLUCOSAMINE CHONDROITIN PLUS PO) Take 2 tablets by mouth daily.      Marland Kitchen losartan (COZAAR) 50 MG tablet Take 1 tablet (50 mg total) by mouth daily.  90 tablet  3  . Multiple Vitamins-Minerals (PRESERVISION AREDS 2) CAPS Take 1 tablet by mouth 2 (two) times daily.      . Phosphatidylserine (NEURO-PS PO) Take 1 tablet by mouth daily.      . vitamin C (ASCORBIC ACID) 500 MG tablet Take 500 mg by mouth daily.       No current facility-administered medications for this visit.     Allergies (verified) Erythromycin and Penicillins   PAST HISTORY  Family History Family History  Problem Relation Age of Onset  . Heart disease Father     Social  History History  Substance Use Topics  . Smoking status: Never Smoker   . Smokeless tobacco: Not on file  . Alcohol Use: No     Are there smokers in your home (other than you)? No  Risk Factors Current exercise habits: walking  Dietary issues discussed: none   Cardiac risk factors: advanced age (older than 70 for men, 4 for women).  Depression Screen (Note: if answer to either of the following is "Yes", a more complete depression screening is indicated)   Over the past two weeks, have you felt down, depressed or hopeless? No  Over the past two weeks, have you felt little interest or pleasure in doing things? No  Have you lost interest or pleasure in daily life? No  Do you often feel hopeless? No  Do you cry easily over simple problems? No  Activities of Daily Living In your present state of health, do you have any difficulty performing the following activities?:  Driving? No Managing money?  No Feeding yourself? No Getting from bed to chair? No Climbing a flight of stairs? No Preparing food and eating?: No Bathing or showering? No Getting dressed: No Getting to the toilet? No Using the toilet:No Moving around from place to place: No In the past year have you fallen or had a near fall?:No   Are you sexually active?  No  Do you have more than one partner?  No  Hearing Difficulties: No Do you often ask people to speak up or repeat themselves? No Do you experience ringing or noises in your ears? Yes Do you have difficulty understanding soft or whispered voices? Yes   Do you feel that you have a problem with memory? No  Do you often misplace items? No  Do you feel safe at home?  Yes  Cognitive Testing  Alert? Yes  Normal Appearance?Yes  Oriented to person? Yes  Place? Yes   Time? Yes  Recall of three objects?  Yes  Can perform simple calculations? Yes  Displays appropriate judgment?Yes  Can read the correct time from a watch face?Yes   Advanced Directives have  been discussed with the patient? Yes  List the Names of Other Physician/Practitioners you currently use: 1.  Dr. Octavia Heir, eye 2. Dr. Darnelle Maffucci 3. Dr. Ernest Haber  Indicate any recent Medical Services you may have received from other than [facility] providers in the past year (date may be approximate).  Immunization History  Administered Date(s) Administered  . Influenza Split 05/07/2012  . Influenza, High  Dose Seasonal PF 05/28/2013  . Pneumococcal Polysaccharide-23 08/01/2005  . Td 08/01/2001  . Tdap 08/01/2005    Screening Tests Health Maintenance  Topic Date Due  . Zostavax  06/22/2014  . Mammogram  07/03/2013  . Influenza Vaccine  03/01/2014  . Tetanus/tdap  08/02/2015  . Colonoscopy  10/09/2022  . Pneumococcal Polysaccharide Vaccine Age 34 And Over  Completed    All answers were reviewed with the patient and necessary referrals were made:  Anyjah Roundtree, MD   06/24/2013   History reviewed: allergies, current medications, past family history, past medical history, past social history, past surgical history and problem list  Review of Systems A comprehensive review of systems was negative.    Objective:     Vision by Snellen chart: right eye: Sees Dr. Ezequiel Ganser yearly Body mass index is 21.74 kg/(m^2). BP 137/82  Pulse 73  Temp(Src) 98.3 F (36.8 C) (Oral)  Ht 5' 3.6" (1.615 m)  Wt 125 lb (56.7 kg)  BMI 21.74 kg/m2  BP 137/82  Pulse 73  Temp(Src) 98.3 F (36.8 C) (Oral)  Ht 5' 3.6" (1.615 m)  Wt 125 lb (56.7 kg)  BMI 21.74 kg/m2  General Appearance:    Alert, cooperative, no distress, appears stated age  Head:    Normocephalic, without obvious abnormality, atraumatic  Eyes:    PERRL, conjunctiva/corneas clear, EOM's intact, , both eyes, left eye with blood under the sclera  Ears:    Normal TM's and external ear canals, both ears  Nose:   Nares normal, septum midline, mucosa normal, no drainage    or sinus tenderness  Throat:   Lips, mucosa, and tongue  normal; teeth and gums normal  Neck:   Supple, symmetrical, trachea midline, no adenopathy;    thyroid:  no enlargement/tenderness/nodules; no carotid   bruit or JVD  Back:     Symmetric, no curvature, ROM normal, no CVA tenderness  Lungs:     Clear to auscultation bilaterally, respirations unlabored  Chest Wall:    No tenderness or deformity   Heart:    Regular rate and rhythm, S1 and S2 normal, no murmur, rub   or gallop  Breast Exam:    No tenderness, masses, or nipple abnormality  Abdomen:     Soft, non-tender, bowel sounds active all four quadrants,    no masses, no organomegaly  Genitalia:    Normal female with small 2mm polyp at the urethra  Rectal:    Not pefromed.   Extremities:   Extremities normal, atraumatic, no cyanosis or edema  Pulses:   2+ and symmetric all extremities  Skin:   Skin color, texture, turgor normal, no rashes or lesions  Lymph nodes:   Cervical, supraclavicular, and axillary nodes normal  Neurologic:   CNII-XII intact, normal strength, sensation and reflexes    throughout       Assessment:     Medicare Annual Wellness Exam       Plan:     During the course of the visit the patient was educated and counseled about appropriate screening and preventive services including:    Screening mammography   Has had flu shot.    Abnormal skin lesion on left shoulder blade - make appt with your dermatologist  Follow lesion on the urethra.   HTN - Due for CMP and lipoids.   Diet review for nutrition referral? Yes ____  Not Indicated __X_   Patient Instructions (the written plan) was given to the patient.  Medicare Attestation I have  personally reviewed: The patient's medical and social history Their use of alcohol, tobacco or illicit drugs Their current medications and supplements The patient's functional ability including ADLs,fall risks, home safety risks, cognitive, and hearing and visual impairment Diet and physical activities Evidence for  depression or mood disorders  The patient's weight, height, BMI, and visual acuity have been recorded in the chart.  I have made referrals, counseling, and provided education to the patient based on review of the above and I have provided the patient with a written personalized care plan for preventive services.     Suhey Radford, MD   06/24/2013

## 2013-06-24 NOTE — Patient Instructions (Addendum)
Abnormal skin lesion on left shoulder blade - make appt with your dermatologist

## 2013-07-04 ENCOUNTER — Ambulatory Visit (INDEPENDENT_AMBULATORY_CARE_PROVIDER_SITE_OTHER): Payer: Medicare Other

## 2013-07-04 DIAGNOSIS — Z1231 Encounter for screening mammogram for malignant neoplasm of breast: Secondary | ICD-10-CM

## 2013-07-19 ENCOUNTER — Other Ambulatory Visit: Payer: Self-pay | Admitting: Family Medicine

## 2013-07-29 ENCOUNTER — Ambulatory Visit: Payer: Medicare Other | Admitting: Family Medicine

## 2013-10-10 ENCOUNTER — Encounter: Payer: Self-pay | Admitting: Family Medicine

## 2013-10-10 ENCOUNTER — Ambulatory Visit (INDEPENDENT_AMBULATORY_CARE_PROVIDER_SITE_OTHER): Payer: Managed Care, Other (non HMO)

## 2013-10-10 ENCOUNTER — Telehealth: Payer: Self-pay | Admitting: Family Medicine

## 2013-10-10 ENCOUNTER — Ambulatory Visit (INDEPENDENT_AMBULATORY_CARE_PROVIDER_SITE_OTHER): Payer: Managed Care, Other (non HMO) | Admitting: Family Medicine

## 2013-10-10 VITALS — BP 151/75 | HR 66 | Ht 64.0 in | Wt 123.0 lb

## 2013-10-10 DIAGNOSIS — M25559 Pain in unspecified hip: Secondary | ICD-10-CM

## 2013-10-10 DIAGNOSIS — G4700 Insomnia, unspecified: Secondary | ICD-10-CM

## 2013-10-10 DIAGNOSIS — N3281 Overactive bladder: Secondary | ICD-10-CM

## 2013-10-10 DIAGNOSIS — M545 Low back pain, unspecified: Secondary | ICD-10-CM

## 2013-10-10 DIAGNOSIS — M25551 Pain in right hip: Secondary | ICD-10-CM

## 2013-10-10 DIAGNOSIS — N318 Other neuromuscular dysfunction of bladder: Secondary | ICD-10-CM

## 2013-10-10 DIAGNOSIS — I10 Essential (primary) hypertension: Secondary | ICD-10-CM

## 2013-10-10 DIAGNOSIS — M5137 Other intervertebral disc degeneration, lumbosacral region: Secondary | ICD-10-CM

## 2013-10-10 DIAGNOSIS — M169 Osteoarthritis of hip, unspecified: Secondary | ICD-10-CM

## 2013-10-10 DIAGNOSIS — M4120 Other idiopathic scoliosis, site unspecified: Secondary | ICD-10-CM

## 2013-10-10 DIAGNOSIS — M1610 Unilateral primary osteoarthritis, unspecified hip: Secondary | ICD-10-CM

## 2013-10-10 DIAGNOSIS — R351 Nocturia: Secondary | ICD-10-CM

## 2013-10-10 LAB — POCT URINALYSIS DIPSTICK
Bilirubin, UA: NEGATIVE
Blood, UA: NEGATIVE
GLUCOSE UA: NEGATIVE
Leukocytes, UA: NEGATIVE
Nitrite, UA: NEGATIVE
PROTEIN UA: NEGATIVE
SPEC GRAV UA: 1.025
UROBILINOGEN UA: 0.2
pH, UA: 6.5

## 2013-10-10 NOTE — Telephone Encounter (Signed)
lvm for pt to return call.[name]  

## 2013-10-10 NOTE — Telephone Encounter (Signed)
Call pt: Ask her to schedule f/u in 2-3 weeks. Sorry computers went down so was unable to schedule.

## 2013-10-10 NOTE — Progress Notes (Signed)
Subjective:    Patient ID: [name], female    DOB: [birth year], 80 y.o.   MRN: [id number]  HPI Back and right hip pain. She currently sees a Restaurant manager, fast food, Dr. Donovan Kail. He has not had x-rays in quite some time. He mostly does adjustments. Had done 2 weeks apart in Feb and march of this year. Did get some relief with this.  She just feels like it has not been as effective recently.  Says now worried to do too much to aggrevate her pain.  Some tenderness on bilat outer thighs.  Feels like a nawing pain most of the time when lays down.  Yesterday felt like a sharp pain while she was trying to stand up from the table.  Walking seems to be ok.  Has tried heating pad at night for almost a month. Did not try ibuprofen.   Insomnia- She started using some melatonin recently.  Over the holidays she was having to take her sleep pills almost every night.  Waking up with nightime frequently. Has been waking up up to 6 times at night. She's also noticed a need to urinate more regularly during the daytime as well. Back she says that she get some public she has been over the restaurant Mr. she gets quite anxious because she will have some urgency.  Hypertension-her blood pressures have been up and down. The other day her systolic was 193 but the other night when she couldn't fall asleep and she was very anxious her systolic blood pressure was 140.  no chest pain, shortness of breath, or palpitations. Review of Systems  BP 151/75  Pulse 66  Ht 5\' 4"  (1.626 m)  Wt 123 lb (55.792 kg)  BMI 21.10 kg/m2  SpO2 98%    Allergies  Allergen Reactions  . Erythromycin Swelling  . Penicillins Swelling and Rash    Past Medical History  Diagnosis Date  . HTN (hypertension)   . Asthma, exercise induced   . Macular degeneration   . OP (osteoporosis)   . Difficulty hearing   . Problems related to lack of adequate sleep   . History of parathyroid disease   . Migraine headache without aura     Past Surgical  History  Procedure Laterality Date  . Parathyroidectomy  09/06/04    Minimally invasive parathyroid surgery by Dr. Charise Killian in [place], [place].  . Anterior acromionectomy  07/31/00    left  . Repair of torn rotator cuff  07/31/00    left, and 2007  . Abdominal hysterectomy    . Breast lumpectomy  1990  . Cataract extraction  [id number]  . Tonsillectomy      History   Social History  . Marital Status: Married    Spouse Name: [name]    Number of Children: 2  . Years of Education: N/A   Occupational History  . School Teacher     retired   Social History Main Topics  . Smoking status: Never Smoker   . Smokeless tobacco: Not on file  . Alcohol Use: No  . Drug Use: No  . Sexual Activity: Yes    Partners: Male   Other Topics Concern  . Not on file   Social History Narrative   At 4 years of college. No regular exercise. No significant caffeine intake.    Family History  Problem Relation Age of Onset  . Heart disease Father     Outpatient Encounter Prescriptions as of 10/10/2013  Medication Sig  .  ALPRAZolam (XANAX) 0.5 MG tablet Take 1 tablet (0.5 mg total) by mouth at bedtime as needed for sleep.  Marland Kitchen amLODipine (NORVASC) 2.5 MG tablet Take 1 tablet (2.5 mg total) by mouth daily.  Marland Kitchen aspirin 81 MG tablet Take 81 mg by mouth daily.  . B Complex Vitamins (VITAMIN B COMPLEX PO) Take 1 tablet by mouth every other day.  . calcium citrate-vitamin D (CITRACAL+D) 315-200 MG-UNIT per tablet Take 1 tablet by mouth daily.  . cholecalciferol (VITAMIN D) 1000 UNITS tablet Take 1,000 Units by mouth daily.  . fish oil-omega-3 fatty acids 1000 MG capsule Take 4 g by mouth daily.  . Glucos-Chond-Sterol-Fish Oil (GLUCOSAMINE CHONDROITIN PLUS PO) Take 2 tablets by mouth daily.  Marland Kitchen losartan (COZAAR) 50 MG tablet Take 1 tablet (50 mg total) by mouth daily.  . Magnesium 200 MG TABS Take 1 tablet by mouth 2 (two) times daily.  . Melatonin 10 MG CAPS Take 1 capsule by mouth as needed.  .  Multiple Vitamins-Minerals (PRESERVISION AREDS 2) CAPS Take 1 tablet by mouth 2 (two) times daily.  . Phosphatidylserine (NEURO-PS PO) Take 1 tablet by mouth daily.  . SYMBICORT 160-4.5 MCG/ACT inhaler inhale 2 puffs by mouth twice a day  . vitamin C (ASCORBIC ACID) 500 MG tablet Take 500 mg by mouth daily.  . [DISCONTINUED] albuterol (VENTOLIN HFA) 108 (90 BASE) MCG/ACT inhaler Inhale 2 puffs into the lungs every 6 (six) hours as needed.          Objective:   Physical Exam  Constitutional: She is oriented to person, place, and time. She appears well-developed and well-nourished.  Musculoskeletal:  Nontender over the lumbar spine. She is slightly tender just lateral to the left SI joint. Hip with normal range of motion. No significant discomfort or pain with internal or external rotation. She does have weak hip abductors. Hip, knee, ankle she gets out of 5 bilaterally.  Neurological: She is alert and oriented to person, place, and time.  Skin: Skin is warm and dry.  Psychiatric: She has a normal mood and affect. Her behavior is normal.          Assessment & Plan:  Low back and right hip pain - will go ahead and get x-rays today for further evaluation to see how much arthritis she may have in the joints. I suspect she has a fair amount based on her age but she really has great range of motion and mobility. I did recommend a short trial of an anti-inflammatory. She has done well with ibuprofen in the past. Recommend ibuprofen 6 her milligrams 3 times a day. Take with food and water and stop immediately if any GI upset or irritation. Recommend take for 5-7 days and then stop and switch to Tylenol as needed. Can continue to apply heat if that's helpful.  Insomnia - Melatonin seems to be really helping. Continue current regimen since it seems to be helping.  Nocturia- Will check ua for infection.  UA is negative.  Suspect cause is overactive bladder. Discussed with her that there are  prescription medications for this if she would like to consider a medication to control her symptoms. This definitely impacts her quality of sleep in her daily fatigue levels.  HTN- Not well controlled but sounds like overall most numbers home are well controlled unless her anxiety is really a high level. Encouraged her to continue to monitor this.

## 2013-10-14 NOTE — Telephone Encounter (Signed)
Pt informed she will call back to make appt.[name] [place]

## 2013-10-31 ENCOUNTER — Encounter: Payer: Self-pay | Admitting: Family Medicine

## 2013-10-31 ENCOUNTER — Ambulatory Visit (INDEPENDENT_AMBULATORY_CARE_PROVIDER_SITE_OTHER): Payer: Medicare HMO | Admitting: Family Medicine

## 2013-10-31 VITALS — BP 130/72 | HR 67 | Ht 64.0 in | Wt 125.0 lb

## 2013-10-31 DIAGNOSIS — M545 Low back pain, unspecified: Secondary | ICD-10-CM

## 2013-10-31 DIAGNOSIS — I10 Essential (primary) hypertension: Secondary | ICD-10-CM

## 2013-10-31 DIAGNOSIS — M25559 Pain in unspecified hip: Secondary | ICD-10-CM

## 2013-10-31 DIAGNOSIS — M25551 Pain in right hip: Secondary | ICD-10-CM

## 2013-10-31 NOTE — Progress Notes (Signed)
   Subjective:    Patient ID: [name], female    DOB: [birth year], 80 y.o.   MRN: [id number]  HPI Here for followup of low back and right hip pain. We did do x-rays at last office visit which did show some significant arthritis in the lumbar spine. She was given a handout on exercises to do on her own at home. She was told to follow up if not improving.  Her pain is much better.  She is sleeping better. She is taking Co-Q 10 about a week ago.  Place on right low back that occ "catches".  Occ happens when gettig out of the care. Says the pain is not waking her up.  Hasn't seen her chiroprator since then.  Not taking some NSAIDs.   Blood pressure was also elevated at last office visit but home blood pressures seem to be a little bit better regulated. Review of Systems     Objective:   Physical Exam  Constitutional: She is oriented to person, place, and time. She appears well-developed and well-nourished.  HENT:  Head: Normocephalic and atraumatic.  Musculoskeletal:  Non tender over the lumbar spine. Hip, knee and ankles strength is 5/5 in both LE extremities.  Patellar reflexes 2+.   Neurological: She is alert and oriented to person, place, and time.  Skin: Skin is warm and dry.  Psychiatric: She has a normal mood and affect. Her behavior is normal.          Assessment & Plan:  Low back and right hip pain - Continue home exercise. Still walking on the treadmil. Discussed when flares to take NSAIDs for 7-10 days to help her recover more quickly. If not continuing to improve then consider PT.   HTN - Well controlled today.   Continue current regimen.

## 2013-12-14 ENCOUNTER — Other Ambulatory Visit: Payer: Self-pay | Admitting: Family Medicine

## 2013-12-24 ENCOUNTER — Ambulatory Visit: Payer: Medicare Other | Admitting: Family Medicine

## 2014-01-08 ENCOUNTER — Encounter: Payer: Self-pay | Admitting: Physician Assistant

## 2014-01-08 ENCOUNTER — Ambulatory Visit (INDEPENDENT_AMBULATORY_CARE_PROVIDER_SITE_OTHER): Payer: Medicare HMO | Admitting: Physician Assistant

## 2014-01-08 VITALS — BP 142/79 | HR 69 | Temp 98.0°F | Ht 64.0 in | Wt 126.0 lb

## 2014-01-08 DIAGNOSIS — J029 Acute pharyngitis, unspecified: Secondary | ICD-10-CM

## 2014-01-08 LAB — POCT RAPID STREP A (OFFICE): RAPID STREP A SCREEN: NEGATIVE

## 2014-01-08 NOTE — Patient Instructions (Signed)
Sore Throat A sore throat is pain, burning, irritation, or scratchiness of the throat. There is often pain or tenderness when swallowing or talking. A sore throat may be accompanied by other symptoms, such as coughing, sneezing, fever, and swollen neck glands. A sore throat is often the first sign of another sickness, such as a cold, flu, strep throat, or mononucleosis (commonly known as mono). Most sore throats go away without medical treatment. CAUSES  The most common causes of a sore throat include:  A viral infection, such as a cold, flu, or mono.  A bacterial infection, such as strep throat, tonsillitis, or whooping cough.  Seasonal allergies.  Dryness in the air.  Irritants, such as smoke or pollution.  Gastroesophageal reflux disease (GERD). HOME CARE INSTRUCTIONS   Only take over-the-counter medicines as directed by your caregiver.  Drink enough fluids to keep your urine clear or pale yellow.  Rest as needed.  Try using throat sprays, lozenges, or sucking on hard candy to ease any pain (if older than 4 years or as directed).  Sip warm liquids, such as broth, herbal tea, or warm water with honey to relieve pain temporarily. You may also eat or drink cold or frozen liquids such as frozen ice pops.  Gargle with salt water (mix 1 tsp salt with 8 oz of water).  Do not smoke and avoid secondhand smoke.  Put a cool-mist humidifier in your bedroom at night to moisten the air. You can also turn on a hot shower and sit in the bathroom with the door closed for 5 10 minutes. SEEK IMMEDIATE MEDICAL CARE IF:  You have difficulty breathing.  You are unable to swallow fluids, soft foods, or your saliva.  You have increased swelling in the throat.  Your sore throat does not get better in 7 days.  You have nausea and vomiting.  You have a fever or persistent symptoms for more than 2 3 days.  You have a fever and your symptoms suddenly get worse. MAKE SURE YOU:   Understand  these instructions.  Will watch your condition.  Will get help right away if you are not doing well or get worse. Document Released: 08/25/2004 Document Revised: 07/04/2012 Document Reviewed: 03/25/2012 [employer] Patient Information 2014 [employer].  

## 2014-01-08 NOTE — Progress Notes (Signed)
   Subjective:    Patient ID: [name], female    DOB: 07-24-1933, 80 y.o.   MRN: [id number]  HPI Pt is a 80 yo female who presents to the clinic with Sore throat that started 6 days ago suddenly in the middle of the night. She woke up to a raw feeling. She tried warm broth on Saturday and gargled with salt water once. She feels like something is 'stuck  In her throat. Denies any chocking. Cold beverages tend to worsen. Not ran a fever, chills, n/v/d, SoB or wheezing. No cough, sinus pressure, ear pain. Concerned because she has used symbicort for 7 years and wonders if it could be causing a problem. She has a weddning for her granddaughter this weekend and wants to feel better. She again insist that she feels great except for ST.    Review of Systems  All other systems reviewed and are negative.      Objective:   Physical Exam  Constitutional: She is oriented to person, place, and time. She appears well-developed and well-nourished.  HENT:  Head: Normocephalic and atraumatic.  TM's clear bilaterally.  Oropharynx erythematous with PND present. Tonsils removed. No exudate or blistered noted.  No thrush in mouth.   Negative for maxillary or frontal sinus tenderness to palpation   Eyes: Conjunctivae are normal. Right eye exhibits no discharge. Left eye exhibits no discharge.  Neck: Normal range of motion. Neck supple.  Cardiovascular: Normal rate, regular rhythm and normal heart sounds.   Pulmonary/Chest: Effort normal and breath sounds normal. She has no wheezes.  Lymphadenopathy:    She has no cervical adenopathy.  Neurological: She is alert and oriented to person, place, and time.  Skin: Skin is dry.  Psychiatric: She has a normal mood and affect. Her behavior is normal.          Assessment & Plan:  Acute pharyngitis- unclear etiology. Rapid strep negative. I do not think bacterial. Seems like ST due to drainage. Discussed mucinex to loosen mucinex. flonase to help relieve  pressure. Continue to gargle with salt water and consider warm honey. Likely with run it's course in next couple of days if not or if symptoms worsening then call office. If continues to have lump in throat may need to send to eNT to look down throat. I did suggest to pt that acid reflux can also sometimes cause ST. Consider zantac OTC if not improving. Ibuprofen is good for pain. Warm mist humidfer can also help keep throat moisterized.

## 2014-02-12 ENCOUNTER — Other Ambulatory Visit: Payer: Self-pay | Admitting: Family Medicine

## 2014-03-10 ENCOUNTER — Ambulatory Visit (INDEPENDENT_AMBULATORY_CARE_PROVIDER_SITE_OTHER): Payer: Medicare HMO | Admitting: Family Medicine

## 2014-03-10 ENCOUNTER — Encounter: Payer: Self-pay | Admitting: Family Medicine

## 2014-03-10 VITALS — BP 125/70 | HR 66 | Wt 127.0 lb

## 2014-03-10 DIAGNOSIS — J449 Chronic obstructive pulmonary disease, unspecified: Secondary | ICD-10-CM

## 2014-03-10 DIAGNOSIS — R109 Unspecified abdominal pain: Secondary | ICD-10-CM

## 2014-03-10 DIAGNOSIS — I10 Essential (primary) hypertension: Secondary | ICD-10-CM

## 2014-03-10 MED ORDER — BUDESONIDE-FORMOTEROL FUMARATE 160-4.5 MCG/ACT IN AERO: INHALATION_SPRAY | RESPIRATORY_TRACT | Status: DC

## 2014-03-10 NOTE — Progress Notes (Signed)
Subjective:    Patient ID: [name], female    DOB: [birth year], 80 y.o.   MRN: [id number]  HPI Hypertension- Pt denies chest pain, SOB, dizziness, or heart palpitations.  Taking meds as directed w/o problems.  Denies medication side effects. Occ BP will go high temporarily if get stress.    Tenderness on the right side of her breast x 4-5 weeks. She points to the area just lateral to her right breast under the axilla. Think doing a lot of sewing/cooking triggered it. . Sore initially in Am but gets better.  Mammogram has been normal. Hx of lumpectomy but more on medial side of breast.  Says this usually happens in the summer. Denies feeling any lump. Says usually activities trigger soreness.    COPD - doing well on symbicort. No recent exacerbations. Says last breathing test was a few years ago.  She is not a smoker and has never been a smoker.  Review of Systems  BP 125/70  Pulse 66  Wt 127 lb (57.607 kg)    Allergies  Allergen Reactions  . Erythromycin Swelling  . Penicillins Swelling and Rash    Past Medical History  Diagnosis Date  . HTN (hypertension)   . Asthma, exercise induced   . Macular degeneration   . OP (osteoporosis)   . Difficulty hearing   . Problems related to lack of adequate sleep   . History of parathyroid disease   . Migraine headache without aura     Past Surgical History  Procedure Laterality Date  . Parathyroidectomy  09/06/04    Minimally invasive parathyroid surgery by Dr. Charise Killian in [place], [place].  . Anterior acromionectomy  07/31/00    left  . Repair of torn rotator cuff  07/31/00    left, and 2007  . Abdominal hysterectomy    . Breast lumpectomy  1990  . Cataract extraction  [id number]  . Tonsillectomy      History   Social History  . Marital Status: Married    Spouse Name: [name]    Number of Children: 2  . Years of Education: N/A   Occupational History  . School Teacher     retired   Social History Main Topics  .  Smoking status: Never Smoker   . Smokeless tobacco: Not on file  . Alcohol Use: No  . Drug Use: No  . Sexual Activity: Yes    Partners: Male   Other Topics Concern  . Not on file   Social History Narrative   At 4 years of college. No regular exercise. No significant caffeine intake.    Family History  Problem Relation Age of Onset  . Heart disease Father     Outpatient Encounter Prescriptions as of 03/10/2014  Medication Sig  . ALPRAZolam (XANAX) 0.5 MG tablet Take 1 tablet (0.5 mg total) by mouth at bedtime as needed for sleep.  Marland Kitchen amLODipine (NORVASC) 2.5 MG tablet take 1 tablet by mouth once daily  . aspirin 81 MG tablet Take 81 mg by mouth daily.  . B Complex Vitamins (VITAMIN B COMPLEX PO) Take 1 tablet by mouth every other day.  . budesonide-formoterol (SYMBICORT) 160-4.5 MCG/ACT inhaler inhale 2 puffs by mouth twice a day  . calcium citrate-vitamin D (CITRACAL+D) 315-200 MG-UNIT per tablet Take 1 tablet by mouth daily.  . cholecalciferol (VITAMIN D) 1000 UNITS tablet Take 1,000 Units by mouth daily.  . Coenzyme Q10 (CO Q10 PO) Take 10 mLs by mouth daily.  Marland Kitchen  fish oil-omega-3 fatty acids 1000 MG capsule Take 2 g by mouth daily.   . Glucos-Chond-Sterol-Fish Oil (GLUCOSAMINE CHONDROITIN PLUS PO) Take 2 tablets by mouth daily.  Marland Kitchen losartan (COZAAR) 50 MG tablet take 1 tablet by mouth once daily  . Magnesium 200 MG TABS Take 1 tablet by mouth 2 (two) times daily.  . Melatonin 10 MG CAPS Take 1 capsule by mouth as needed.  . Multiple Vitamins-Minerals (PRESERVISION AREDS 2) CAPS Take 1 tablet by mouth 2 (two) times daily.  . vitamin C (ASCORBIC ACID) 500 MG tablet Take 500 mg by mouth daily.  . [DISCONTINUED] SYMBICORT 160-4.5 MCG/ACT inhaler inhale 2 puffs by mouth twice a day  . [DISCONTINUED] Phosphatidylserine (NEURO-PS PO) Take 1 tablet by mouth daily.          Objective:   Physical Exam  Constitutional: She is oriented to person, place, and time. She appears  well-developed and well-nourished.  HENT:  Head: Normocephalic and atraumatic.  Cardiovascular: Normal rate, regular rhythm and normal heart sounds.   Pulmonary/Chest: Effort normal and breath sounds normal.  Neurological: She is alert and oriented to person, place, and time.  Skin: Skin is warm and dry.  Psychiatric: She has a normal mood and affect. Her behavior is normal.          Assessment & Plan:  HTN- well controlled.  Continue current regimen.  COPD - stable but due to recheck spirometry. Will schedule for later this fall. For now continue with Symbicort and as needed albuterol.  Side pain - recommend treat as a muscle strain. Heat, gentle streteches. Cal lif not resolving in next 3-4 weeks.  Can get early mammo if needed. Her last mammogram was in December 2014 was normal.  Declines shingles vaccine.

## 2014-04-23 ENCOUNTER — Telehealth: Payer: Self-pay | Admitting: Family Medicine

## 2014-04-23 ENCOUNTER — Encounter: Payer: Self-pay | Admitting: Family Medicine

## 2014-04-23 ENCOUNTER — Ambulatory Visit (INDEPENDENT_AMBULATORY_CARE_PROVIDER_SITE_OTHER): Payer: Medicare HMO | Admitting: Family Medicine

## 2014-04-23 VITALS — BP 142/80 | HR 71 | Temp 97.4°F | Ht 64.0 in | Wt 128.0 lb

## 2014-04-23 DIAGNOSIS — J449 Chronic obstructive pulmonary disease, unspecified: Secondary | ICD-10-CM

## 2014-04-23 DIAGNOSIS — Z23 Encounter for immunization: Secondary | ICD-10-CM

## 2014-04-23 MED ORDER — ACLIDINIUM BROMIDE 400 MCG/ACT IN AEPB: 1.0000 | INHALATION_SPRAY | Freq: Every day | RESPIRATORY_TRACT | Status: DC

## 2014-04-23 MED ORDER — ALBUTEROL SULFATE HFA 108 (90 BASE) MCG/ACT IN AERS: 2.0000 | INHALATION_SPRAY | Freq: Four times a day (QID) | RESPIRATORY_TRACT | Status: DC | PRN

## 2014-04-23 MED ORDER — ALBUTEROL SULFATE (2.5 MG/3ML) 0.083% IN NEBU
2.5000 mg | INHALATION_SOLUTION | Freq: Once | RESPIRATORY_TRACT | Status: AC
  Administered 2014-04-23: 2.5 mg via RESPIRATORY_TRACT

## 2014-04-23 NOTE — Progress Notes (Signed)
   Subjective:    Patient ID: [name], female    DOB: [birth year], 80 y.o.   MRN: [id number]  HPI F/U COPD- here for spirometry today. Unfortunately had complications with the machine. We were unable to get a pre-and post complete test. There were different the posttest. I did ask her to come back on a different day for no charge to have the test done.  Review of Systems     Objective:   Physical Exam  Constitutional: She is oriented to person, place, and time. She appears well-developed and well-nourished.  HENT:  Head: Normocephalic and atraumatic.  Cardiovascular: Normal rate, regular rhythm and normal heart sounds.   Pulmonary/Chest: Effort normal and breath sounds normal.  Neurological: She is alert and oriented to person, place, and time.  Skin: Skin is warm and dry.  Psychiatric: She has a normal mood and affect. Her behavior is normal.          Assessment & Plan:  COPD-we did discuss starting tudorza. Demonstrated how to use the device. She said to probably reschedule the test before she starts the medication. She also has a rescue albuterol inhaler to use as needed. Continue with  Symbicort.

## 2014-04-30 NOTE — Telephone Encounter (Signed)
error 

## 2014-05-07 ENCOUNTER — Ambulatory Visit (INDEPENDENT_AMBULATORY_CARE_PROVIDER_SITE_OTHER): Payer: Medicare HMO | Admitting: Family Medicine

## 2014-05-07 VITALS — BP 133/75 | HR 71 | Wt 130.0 lb

## 2014-05-07 DIAGNOSIS — J449 Chronic obstructive pulmonary disease, unspecified: Secondary | ICD-10-CM

## 2014-05-07 NOTE — Progress Notes (Signed)
   Subjective:    Patient ID: [name], female    DOB: 1933-06-28, 80 y.o.   MRN: [id number]  HPIPatient here for baseline spirometry; denies any shortness of breath.    Review of Systems     Objective:   Physical Exam        Assessment & Plan:  Spirometry today.  COPD-spirometry today shows mild airway obstruction. No significant changes. This brought about her chest was not improved. FVC of 96%, FEV1 of 78% with a ratio of 59%. She's been on Symbicort the last couple of years. I did switch her to tudorza at the last office visit. She can continue with this. Keep regular followup. Recommend repeat spirometry in one year.

## 2014-05-07 NOTE — Progress Notes (Signed)
[facility] was notified of inhaler changes and verbalized understanding. Margette Fast, CMA

## 2014-06-14 ENCOUNTER — Encounter: Payer: Self-pay | Admitting: Family Medicine

## 2014-07-01 ENCOUNTER — Encounter: Payer: Self-pay | Admitting: Family Medicine

## 2014-07-01 ENCOUNTER — Ambulatory Visit (INDEPENDENT_AMBULATORY_CARE_PROVIDER_SITE_OTHER): Payer: Medicare HMO | Admitting: Family Medicine

## 2014-07-01 ENCOUNTER — Other Ambulatory Visit: Payer: Self-pay | Admitting: Family Medicine

## 2014-07-01 VITALS — BP 133/74 | HR 63 | Ht 64.0 in | Wt 127.0 lb

## 2014-07-01 DIAGNOSIS — Z23 Encounter for immunization: Secondary | ICD-10-CM

## 2014-07-01 DIAGNOSIS — Z0000 Encounter for general adult medical examination without abnormal findings: Secondary | ICD-10-CM

## 2014-07-01 DIAGNOSIS — I10 Essential (primary) hypertension: Secondary | ICD-10-CM

## 2014-07-01 DIAGNOSIS — Z1231 Encounter for screening mammogram for malignant neoplasm of breast: Secondary | ICD-10-CM

## 2014-07-01 DIAGNOSIS — S6992XA Unspecified injury of left wrist, hand and finger(s), initial encounter: Secondary | ICD-10-CM

## 2014-07-01 LAB — LIPID PANEL
CHOLESTEROL: 230 mg/dL — AB (ref 0–200)
HDL: 67 mg/dL (ref >39)
LDL Cholesterol: 149 mg/dL — ABNORMAL HIGH (ref 0–99)
Total CHOL/HDL Ratio: 3.4 Ratio
Triglycerides: 71 mg/dL (ref <150)
VLDL: 14 mg/dL (ref 0–40)

## 2014-07-01 LAB — COMPLETE METABOLIC PANEL WITH GFR
ALBUMIN: 4.2 g/dL (ref 3.5–5.2)
ALK PHOS: 73 U/L (ref 39–117)
ALT: 17 U/L (ref 0–35)
AST: 20 U/L (ref 0–37)
BUN: 15 mg/dL (ref 6–23)
CO2: 28 mEq/L (ref 19–32)
Calcium: 9.8 mg/dL (ref 8.4–10.5)
Chloride: 106 mEq/L (ref 96–112)
Creat: 0.81 mg/dL (ref 0.50–1.10)
GFR, EST NON AFRICAN AMERICAN: 69 mL/min
GFR, Est African American: 80 mL/min
Glucose, Bld: 72 mg/dL (ref 70–99)
POTASSIUM: 4 meq/L (ref 3.5–5.3)
Sodium: 142 mEq/L (ref 135–145)
Total Bilirubin: 0.7 mg/dL (ref 0.2–1.2)
Total Protein: 6.7 g/dL (ref 6.0–8.3)

## 2014-07-01 LAB — TSH: TSH: 1.52 u[IU]/mL (ref 0.350–4.500)

## 2014-07-01 MED ORDER — ALPRAZOLAM 0.5 MG PO TABS: 0.5000 mg | ORAL_TABLET | Freq: Every evening | ORAL | Status: DC | PRN

## 2014-07-01 MED ORDER — ACLIDINIUM BROMIDE 400 MCG/ACT IN AEPB
1.0000 | INHALATION_SPRAY | Freq: Every day | RESPIRATORY_TRACT | Status: DC
Start: 2014-07-01 — End: 2014-09-10

## 2014-07-01 NOTE — Progress Notes (Signed)
Subjective:    [name] is a 80 y.o. female who presents for Medicare Annual/Subsequent preventive examination.  Preventive Screening-Counseling & Management  Tobacco History  Smoking status  . Never Smoker   Smokeless tobacco  . Not on file     Problems Prior to Visit 1. HTN- has had a few elevated BPs in the 140s.  Says occ will spike if nervous, anxious or angry.   2. Says her left thumb has felt weak for the last month or two. Noticed it after using her sprayer for her garden.  She notices she can't use her thumb for fine coordinated movement like clasping a necklace.  She denies any pain  Current Problems (verified) Patient Active Problem List   Diagnosis Date Noted  . Essential hypertension 03/10/2014  . Fibrocystic breast changes 06/16/2012  . 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 Vitamin D deficiency 06/15/2012  . Internal hemorrhoid 06/15/2012  . COPD (chronic obstructive pulmonary disease) 06/15/2012  . Palpitations 03/29/2012  . Asthma, exercise induced   . Macular degeneration     Medications Prior to Visit Current Outpatient Prescriptions on File Prior to Visit  Medication Sig Dispense Refill  . albuterol (VENTOLIN HFA) 108 (90 BASE) MCG/ACT inhaler Inhale 2 puffs into the lungs every 6 (six) hours as needed. 1 Inhaler 3  . amLODipine (NORVASC) 2.5 MG tablet take 1 tablet by mouth once daily 90 tablet 3  . aspirin 81 MG tablet Take 81 mg by mouth daily.    . B Complex Vitamins (VITAMIN B COMPLEX PO) Take 1 tablet by mouth every other day.    . calcium citrate-vitamin D (CITRACAL+D) 315-200 MG-UNIT per tablet Take 1 tablet by mouth 2 (two) times daily.     . Coenzyme Q10 (CO Q10 PO) Take 10 mLs by mouth daily.    . fish oil-omega-3 fatty acids 1000 MG capsule Take 2 g by mouth daily.     . Glucos-Chond-Sterol-Fish Oil (GLUCOSAMINE CHONDROITIN PLUS PO) Take 2 tablets by mouth daily.    Marland Kitchen losartan (COZAAR) 50 MG tablet take 1 tablet by mouth once  daily 90 tablet 3  . Magnesium 200 MG TABS Take 1 tablet by mouth daily.     . Melatonin 10 MG CAPS Take 1 capsule by mouth as needed.    . Multiple Vitamins-Minerals (PRESERVISION AREDS 2) CAPS Take 1 tablet by mouth 2 (two) times daily.    . vitamin C (ASCORBIC ACID) 500 MG tablet Take 500 mg by mouth daily.     No current facility-administered medications on file prior to visit.    Current Medications (verified) Current Outpatient Prescriptions  Medication Sig Dispense Refill  . Cholecalciferol (VITAMIN D-3) 1000 UNITS CAPS Take 2 capsules by mouth 2 (two) times daily.    . Aclidinium Bromide (TUDORZA PRESSAIR) 400 MCG/ACT AEPB Inhale 1 Inhaler into the lungs daily. 1 each 5  . albuterol (VENTOLIN HFA) 108 (90 BASE) MCG/ACT inhaler Inhale 2 puffs into the lungs every 6 (six) hours as needed. 1 Inhaler 3  . ALPRAZolam (XANAX) 0.5 MG tablet Take 1 tablet (0.5 mg total) by mouth at bedtime as needed for sleep. 30 tablet 1  . amLODipine (NORVASC) 2.5 MG tablet take 1 tablet by mouth once daily 90 tablet 3  . aspirin 81 MG tablet Take 81 mg by mouth daily.    . B Complex Vitamins (VITAMIN B COMPLEX PO) Take 1 tablet by mouth every other day.    . calcium citrate-vitamin  D (CITRACAL+D) 315-200 MG-UNIT per tablet Take 1 tablet by mouth 2 (two) times daily.     . Coenzyme Q10 (CO Q10 PO) Take 10 mLs by mouth daily.    . fish oil-omega-3 fatty acids 1000 MG capsule Take 2 g by mouth daily.     . Glucos-Chond-Sterol-Fish Oil (GLUCOSAMINE CHONDROITIN PLUS PO) Take 2 tablets by mouth daily.    Marland Kitchen losartan (COZAAR) 50 MG tablet take 1 tablet by mouth once daily 90 tablet 3  . Magnesium 200 MG TABS Take 1 tablet by mouth daily.     . Melatonin 10 MG CAPS Take 1 capsule by mouth as needed.    . Multiple Vitamins-Minerals (PRESERVISION AREDS 2) CAPS Take 1 tablet by mouth 2 (two) times daily.    . vitamin C (ASCORBIC ACID) 500 MG tablet Take 500 mg by mouth daily.     No current facility-administered  medications for this visit.     Allergies (verified) Erythromycin and Penicillins   PAST HISTORY  Family History Family History  Problem Relation Age of Onset  . Heart disease Father     Social History History  Substance Use Topics  . Smoking status: Never Smoker   . Smokeless tobacco: Not on file  . Alcohol Use: No     Are there smokers in your home (other than you)? No  Risk Factors Current exercise habits: walks 2 miles 2-3 times per week  Dietary issues discussed: None    Cardiac risk factors: advanced age (older than 78 for men, 36 for women) and hypertension.  Depression Screen (Note: if answer to either of the following is "Yes", a more complete depression screening is indicated)   Over the past two weeks, have you felt down, depressed or hopeless? No  Over the past two weeks, have you felt little interest or pleasure in doing things? No  Have you lost interest or pleasure in daily life? No  Do you often feel hopeless? No  Do you cry easily over simple problems? No  Activities of Daily Living In your present state of health, do you have any difficulty performing the following activities?:  Driving? No Managing money?  No Feeding yourself? No Getting from bed to chair? No  Climbing a flight of stairs? No Preparing food and eating?: No Bathing or showering? No Getting dressed: No Getting to the toilet? No Using the toilet:No Moving around from place to place: No In the past year have you fallen or had a near fall?:No   Are you sexually active?  No  Do you have more than one partner?  No  Hearing Difficulties: No Do you often ask people to speak up or repeat themselves? No Do you experience ringing or noises in your ears? Yes Do you have difficulty understanding soft or whispered voices? Yes   Do you feel that you have a problem with memory? No  Do you often misplace items? No  Do you feel safe at home?  Yes  Cognitive Testing  Alert? Yes  Normal  Appearance?Yes  Oriented to person? Yes  Place? Yes   Time? Yes  Recall of three objects?  Yes  Can perform simple calculations? Yes  Displays appropriate judgment?Yes  Can read the correct time from a watch face?Yes   6 CIT score of 0/28 ( normal)   Advanced Directives have been discussed with the patient? Yes  List the Names of Other Physician/Practitioners you currently use: 1.  Dr. Donovan Kail 2. Dr. Jodi Mourning -  eye  Indicate any recent Medical Services you may have received from other than [facility] providers in the past year (date may be approximate).  Immunization History  Administered Date(s) Administered  . Influenza Split 05/07/2012  . Influenza, High Dose Seasonal PF 05/28/2013  . Influenza,inj,Quad PF,36+ Mos 04/23/2014  . Pneumococcal Polysaccharide-23 08/01/2005  . Td 08/01/2001  . Tdap 08/01/2005    Screening Tests Health Maintenance  Topic Date Due  . ZOSTAVAX  03/11/2015 (Originally 11/03/1994)  . MAMMOGRAM  07/04/2014  . INFLUENZA VACCINE  03/02/2015  . TETANUS/TDAP  08/02/2015  . COLONOSCOPY  10/09/2022  . DEXA SCAN  Completed  . PNEUMOCOCCAL POLYSACCHARIDE VACCINE AGE 88 AND OVER  Completed    All answers were reviewed with the patient and necessary referrals were made:  Falan Hensler, MD   07/01/2014   History reviewed: allergies, current medications, past family history, past medical history, past social history, past surgical history and problem list  Review of Systems A comprehensive review of systems was negative.    Objective:     Vision by Snellen chart: right eye Sees Dr. Hoyle Sauer every 6 months.   Body mass index is 21.79 kg/(m^2). BP 133/74 mmHg  Pulse 63  Ht 5\' 4"  (1.626 m)  Wt 127 lb (57.607 kg)  BMI 21.79 kg/m2  SpO2 98%  BP 133/74 mmHg  Pulse 63  Ht 5\' 4"  (1.626 m)  Wt 127 lb (57.607 kg)  BMI 21.79 kg/m2  SpO2 98% General appearance: alert, cooperative and appears stated age Head: Normocephalic, without obvious  abnormality, atraumatic Eyes: conj clear, EOMi, PEERLA Ears: normal TM's and external ear canals both ears Nose: Nares normal. Septum midline. Mucosa normal. No drainage or sinus tenderness. Throat: lips, mucosa, and tongue normal; teeth and gums normal Neck: no adenopathy, no carotid bruit, no JVD, supple, symmetrical, trachea midline and thyroid not enlarged, symmetric, no tenderness/mass/nodules Back: symmetric, no curvature. ROM normal. No CVA tenderness. Lungs: clear to auscultation bilaterally Heart: regular rate and rhythm, S1, S2 normal, no murmur, click, rub or gallop Abdomen: soft, non-tender; bowel sounds normal; no masses,  no organomegaly Extremities: extremities normal, atraumatic, no cyanosis or edema Pulses: 2+ and symmetric Skin: Skin color, texture, turgor normal. No rashes or lesions Lymph nodes: Cervical, supraclavicular, and axillary nodes normal. Neurologic: Alert and oriented X 3, normal strength and tone. Normal symmetric reflexes. Normal coordination and gait     Assessment:     Annual medicare Wellness Exam       Plan:     During the course of the visit the patient was educated and counseled about appropriate screening and preventive services including:   Pneumococcal vaccine   Decreased flexion in the distal left thumb - work on ROM, if not improving then please let me know and can refer to sports medicine.     Diet review for nutrition referral? Yes ____  Not Indicated _X_   Patient Instructions (the written plan) was given to the patient.  Medicare Attestation I have personally reviewed: The patient's medical and social history Their use of alcohol, tobacco or illicit drugs Their current medications and supplements The patient's functional ability including ADLs,fall risks, home safety risks, cognitive, and hearing and visual impairment Diet and physical activities Evidence for depression or mood disorders  The patient's weight, height, BMI,  and visual acuity have been recorded in the chart.  I have made referrals, counseling, and provided education to the patient based on review of the above and I have provided  the patient with a written personalized care plan for preventive services.     Lashonne Shull, MD   07/01/2014

## 2014-07-01 NOTE — Addendum Note (Signed)
Addended by: Teddy Spike on: 07/01/2014 05:17 PM   Modules accepted: Orders

## 2014-07-01 NOTE — Patient Instructions (Signed)
Keep up a regular exercise program and make sure you are eating a healthy diet Try to eat 4 servings of dairy a day, or if you are lactose intolerant take a calcium with vitamin D daily.  Your vaccines are up to date.   

## 2014-07-09 ENCOUNTER — Ambulatory Visit (INDEPENDENT_AMBULATORY_CARE_PROVIDER_SITE_OTHER): Payer: Medicare HMO

## 2014-07-09 DIAGNOSIS — Z1231 Encounter for screening mammogram for malignant neoplasm of breast: Secondary | ICD-10-CM

## 2014-07-29 ENCOUNTER — Telehealth: Payer: Self-pay | Admitting: Family Medicine

## 2014-07-29 DIAGNOSIS — M79644 Pain in right finger(s): Secondary | ICD-10-CM

## 2014-07-29 NOTE — Telephone Encounter (Signed)
Order placed

## 2014-07-29 NOTE — Telephone Encounter (Signed)
Patient needs ref for PT for right thumb.  She said that you and she discussed already. Can you put in the ref to PT in our bldg?  Thanks

## 2014-07-29 NOTE — Telephone Encounter (Signed)
Patient advised.

## 2014-08-06 ENCOUNTER — Ambulatory Visit (INDEPENDENT_AMBULATORY_CARE_PROVIDER_SITE_OTHER): Payer: Medicare HMO | Admitting: Physical Therapy

## 2014-08-06 DIAGNOSIS — M6281 Muscle weakness (generalized): Secondary | ICD-10-CM

## 2014-08-06 DIAGNOSIS — M79644 Pain in right finger(s): Secondary | ICD-10-CM

## 2014-08-13 ENCOUNTER — Encounter (INDEPENDENT_AMBULATORY_CARE_PROVIDER_SITE_OTHER): Payer: Medicare HMO | Admitting: Physical Therapy

## 2014-08-13 DIAGNOSIS — M79644 Pain in right finger(s): Secondary | ICD-10-CM

## 2014-08-13 DIAGNOSIS — M6281 Muscle weakness (generalized): Secondary | ICD-10-CM

## 2014-08-20 ENCOUNTER — Encounter (INDEPENDENT_AMBULATORY_CARE_PROVIDER_SITE_OTHER): Payer: Medicare HMO | Admitting: Physical Therapy

## 2014-08-20 DIAGNOSIS — M6281 Muscle weakness (generalized): Secondary | ICD-10-CM

## 2014-08-20 DIAGNOSIS — M79644 Pain in right finger(s): Secondary | ICD-10-CM

## 2014-08-22 ENCOUNTER — Ambulatory Visit: Payer: Medicare HMO | Admitting: Family Medicine

## 2014-08-27 ENCOUNTER — Encounter (INDEPENDENT_AMBULATORY_CARE_PROVIDER_SITE_OTHER): Payer: Medicare HMO | Admitting: Physical Therapy

## 2014-08-27 DIAGNOSIS — M6281 Muscle weakness (generalized): Secondary | ICD-10-CM

## 2014-08-27 DIAGNOSIS — M79644 Pain in right finger(s): Secondary | ICD-10-CM

## 2014-09-01 ENCOUNTER — Other Ambulatory Visit: Payer: Self-pay | Admitting: Family Medicine

## 2014-09-01 ENCOUNTER — Encounter: Payer: Self-pay | Admitting: Family Medicine

## 2014-09-01 ENCOUNTER — Ambulatory Visit (INDEPENDENT_AMBULATORY_CARE_PROVIDER_SITE_OTHER): Payer: Medicare HMO | Admitting: Family Medicine

## 2014-09-01 VITALS — BP 137/72 | HR 72 | Wt 130.0 lb

## 2014-09-01 DIAGNOSIS — N368 Other specified disorders of urethra: Secondary | ICD-10-CM

## 2014-09-01 DIAGNOSIS — L298 Other pruritus: Secondary | ICD-10-CM

## 2014-09-01 DIAGNOSIS — N898 Other specified noninflammatory disorders of vagina: Secondary | ICD-10-CM

## 2014-09-01 LAB — WET PREP FOR TRICH, YEAST, CLUE
Clue Cells Wet Prep HPF POC: NONE SEEN
TRICH WET PREP: NONE SEEN
Yeast Wet Prep HPF POC: NONE SEEN

## 2014-09-01 MED ORDER — ESTRADIOL 0.1 MG/GM VA CREA: 1.0000 | TOPICAL_CREAM | Freq: Every day | VAGINAL | Status: DC

## 2014-09-01 NOTE — Progress Notes (Signed)
   Subjective:    Patient ID: [name], female    DOB: 14-Dec-1932, 81 y.o.   MRN: [id number]  HPI    Review of Systems     Objective:   Physical Exam        Assessment & Plan:

## 2014-09-01 NOTE — Progress Notes (Signed)
   Subjective:    Patient ID: [name], female    DOB: 1933/07/13, 81 y.o.   MRN: [id number]  HPI Has a spot on her vaginal area that has been there for months. She says it is not hard or painful. Says right next to this has a red area that is occ tender.  Says can sit a certain way and will feel a burning for a minute or two.  Says not really painful. Has had some pelvic pressure just above the pubic bone as well. No urinary sxs.  No abnormal discharged. Had complete hysterectomy in 1985.    Review of Systems     Objective:   Physical Exam  Constitutional: She is oriented to person, place, and time. She appears well-developed and well-nourished.  HENT:  Head: Normocephalic and atraumatic.  Genitourinary: Vagina normal.  Some atropy of the walls. No lesion. WEt prep performed.  She does have some mild prolapse of the urthrea.  She does have a 2 mm peiced of soft white/yellow tissues at the edge at the 12 oclcok position. It is nontender.  May just be part of the urethra vs a tiny polyp.   Neurological: She is alert and oriented to person, place, and time.  Skin: Skin is warm and dry.  Psychiatric: She has a normal mood and affect.          Assessment & Plan:  Mild urethral prolapse-will treat with topical estrogen cream. As far as the tiny little area of white tissue it's unclear to me if this is just part of the urethral tissue or possibly a tiny polyp. We'll keep an eye on this area. Can call if she starts to notice is changing in size. Did go ahead and do a wet prep today and will call with results of that as well.

## 2014-09-03 ENCOUNTER — Encounter (INDEPENDENT_AMBULATORY_CARE_PROVIDER_SITE_OTHER): Payer: Medicare HMO | Admitting: Physical Therapy

## 2014-09-03 DIAGNOSIS — M79644 Pain in right finger(s): Secondary | ICD-10-CM

## 2014-09-03 DIAGNOSIS — M6281 Muscle weakness (generalized): Secondary | ICD-10-CM

## 2014-09-10 ENCOUNTER — Telehealth: Payer: Self-pay | Admitting: Family Medicine

## 2014-09-10 MED ORDER — TIOTROPIUM BROMIDE MONOHYDRATE 1.25 MCG/ACT IN AERS: 2.0000 | INHALATION_SPRAY | Freq: Every day | RESPIRATORY_TRACT | Status: DC

## 2014-09-10 NOTE — Telephone Encounter (Signed)
Please call patient and let her know that I received her letter. Since her [name] will no longer be covered I am going to switch her to Spiriva which is fairly equivalent. They are going to want her to have tried Spiriva before they will cover the tudorza with a prior authorization. We will go ahead and send a sever to her pharmacy. If she has any problems please let us know.

## 2014-09-11 NOTE — Telephone Encounter (Signed)
Pt informed. She stated that she has enough tudorza to last her until the end of April. She told the pharmacy to hold the medication will fwd to pcp. She stated that she did not realize that there would be a problem with this medication at the time that she had requested the refill.[name]

## 2014-10-15 ENCOUNTER — Encounter: Payer: Self-pay | Admitting: Family Medicine

## 2014-10-15 ENCOUNTER — Ambulatory Visit (INDEPENDENT_AMBULATORY_CARE_PROVIDER_SITE_OTHER): Payer: Medicare HMO | Admitting: Family Medicine

## 2014-10-15 VITALS — BP 140/74 | HR 65 | Ht 64.0 in | Wt 129.0 lb

## 2014-10-15 DIAGNOSIS — N368 Other specified disorders of urethra: Secondary | ICD-10-CM | POA: Diagnosis not present

## 2014-10-15 DIAGNOSIS — J449 Chronic obstructive pulmonary disease, unspecified: Secondary | ICD-10-CM

## 2014-10-15 NOTE — Progress Notes (Signed)
   Subjective:    Patient ID: [name], female    DOB: 10-20-1932, 81 y.o.   MRN: [id number]  HPI Here today to follow-up for urethral prolapse. She did start the topical estrogen. She said she felt almost more dry using it initially. Now she's down to using it twice a week for the last 3 weeks and notices that she's been much more comfortable. She's had much less irritation around the area. She also notices that the color instead of looking more white actually looks more pink again. And she feels like it protrudes a little less. She does want to know about the long-term risks of using this medication. She has questions since she was on oral estrogen years ago.   COPD-she did have a question about her inhalers. Her insurance will no longer cover the tudorza so we have switched her to the [place]. She hasn't picked up the device yet.  Review of Systems     Objective:   Physical Exam  Constitutional: She appears well-developed and well-nourished.  HENT:  Head: Normocephalic and atraumatic.  Genitourinary:     Skin: Skin is warm and dry. No rash noted.  Psychiatric: She has a normal mood and affect. Her behavior is normal.          Assessment & Plan:  Urethral prolapse-much improved after application of topical estrogen. Continue twice a week application for the next couple of months. If at that point she still doing well then might be able to decrease down to once a week for maintenance. We discussed the studies have never shown a direct correlation to increase risk of breast cancer etc. It really does act more topically and she is applying a very tiny amount compared to a gram which is often used for vaginal use.  COPD/Asthma-we'll switch to the Spiriva respimat. I showed her how to use the device today here in the office. Says she would feel more comfortable with it. If she has any palms and please let me know.

## 2014-12-17 ENCOUNTER — Emergency Department
Admission: EM | Admit: 2014-12-17 | Discharge: 2014-12-17 | Disposition: A | Discharge status: Home / Self Care | Payer: Medicare HMO | Source: Home / Self Care | Attending: Family Medicine | Admitting: Family Medicine

## 2014-12-17 ENCOUNTER — Encounter: Payer: Self-pay | Admitting: *Deleted

## 2014-12-17 DIAGNOSIS — J069 Acute upper respiratory infection, unspecified: Secondary | ICD-10-CM | POA: Diagnosis not present

## 2014-12-17 MED ORDER — DOXYCYCLINE HYCLATE 100 MG PO CAPS
100.0000 mg | ORAL_CAPSULE | Freq: Two times a day (BID) | ORAL | Status: DC
Start: 2014-12-17 — End: 2014-12-31

## 2014-12-17 MED ORDER — PREDNISONE 10 MG PO TABS: ORAL_TABLET | ORAL | Status: DC

## 2014-12-17 MED ORDER — BENZONATATE 200 MG PO CAPS: 200.0000 mg | ORAL_CAPSULE | Freq: Every day | ORAL | Status: DC

## 2014-12-17 NOTE — Discharge Instructions (Signed)
Take plain guaifenesin (1200mg  extended release tabs such as Mucinex) twice daily, with plenty of water, for cough and congestion. Get adequate rest.  May use Afrin nasal spray (or generic oxymetazoline) twice daily for about 5 days.  Also recommend using saline nasal spray several times daily and saline nasal irrigation (AYR is a common brand).   Try warm salt water gargles for sore throat.  Continue inhalers as prescribed. Stop all antihistamines for now, and other non-prescription cough/cold preparations. Begin doxycycline if not improving about one week or if persistent fever develops  Follow-up with family doctor if not improving about10 days.

## 2014-12-17 NOTE — ED Notes (Signed)
Pt c/o HA and body aches x 1 day. She reports some sneezing, post nasal drip, and allergy s/s last wk. She took Mucinex at 0300 today. She reports a tick bite to her LT groin area x last night.

## 2014-12-17 NOTE — ED Provider Notes (Signed)
CSN: [id number]     Arrival date & time 12/17/14  1354 History   First MD Initiated Contact with Patient 12/17/14 1541     Chief Complaint  Patient presents with  . Headache  . Generalized Body Aches  . Insect Bite    HPI Comments: Patient complains of sneezing and increased sinus congestion for one week.  Last night she had a mild sore throat, and today she developed myalgias, headache, and nausea without vomiting. She has a history of asthma, and uses Spiriva. She noticed a tick embedded in her left groin last night.  The tick was relatively large, but not engorged.  She reports no redness, swelling, or tenderness at the bite site.  The history is provided by the patient.    Past Medical History  Diagnosis Date  . HTN (hypertension)   . Asthma, exercise induced   . Macular degeneration   . OP (osteoporosis)   . Difficulty hearing   . Problems related to lack of adequate sleep   . History of parathyroid disease   . Migraine headache without aura    Past Surgical History  Procedure Laterality Date  . Parathyroidectomy  09/06/04    Minimally invasive parathyroid surgery by Dr. Charise Killian in [place], [place].  . Anterior acromionectomy  07/31/00    left  . Repair of torn rotator cuff  07/31/00    left, and 2007  . Abdominal hysterectomy    . Breast lumpectomy  1990  . Cataract extraction  [id number]  . Tonsillectomy     Family History  Problem Relation Age of Onset  . Heart disease Father    History  Substance Use Topics  . Smoking status: Never Smoker   . Smokeless tobacco: Not on file  . Alcohol Use: No   OB History    No data available     Review of Systems + sore throat No cough No pleuritic pain No wheezing + nasal congestion + post-nasal drainage No sinus pain/pressure No itchy/red eyes No earache No hemoptysis No SOB ? fever, + chills + nausea No vomiting No abdominal pain No diarrhea No urinary symptoms No skin rash + fatigue + myalgias +  headache Used OTC meds without relief  Allergies  Erythromycin and Penicillins  Home Medications   Prior to Admission medications   Medication Sig Start Date End Date Taking? Authorizing Provider  albuterol (VENTOLIN HFA) 108 (90 BASE) MCG/ACT inhaler Inhale 2 puffs into the lungs every 6 (six) hours as needed. 04/23/14   [name], MD  ALPRAZolam Duanne Moron) 0.5 MG tablet Take 1 tablet (0.5 mg total) by mouth at bedtime as needed for sleep. 07/01/14   [name], MD  amLODipine (NORVASC) 2.5 MG tablet take 1 tablet by mouth once daily    [name], MD  aspirin 81 MG tablet Take 81 mg by mouth daily.    Historical Provider, MD  B Complex Vitamins (VITAMIN B COMPLEX PO) Take 1 tablet by mouth every other day.    Historical Provider, MD  benzonatate (TESSALON) 200 MG capsule Take 1 capsule (200 mg total) by mouth at bedtime. Take as needed for cough 12/17/14   Kandra Nicolas, MD  calcium citrate-vitamin D (CITRACAL+D) 315-200 MG-UNIT per tablet Take 1 tablet by mouth 2 (two) times daily.     Historical Provider, MD  Cholecalciferol (VITAMIN D-3) 1000 UNITS CAPS Take 2 capsules by mouth 2 (two) times daily.    Historical Provider, MD  doxycycline (  VIBRAMYCIN) 100 MG capsule Take 1 capsule (100 mg total) by mouth 2 (two) times daily. Take with food (Rx void after 12/25/14) 12/17/14   Kandra Nicolas, MD  estradiol (ESTRACE VAGINAL) 0.1 MG/GM vaginal cream Place 1 Applicatorful vaginally at bedtime. After 1-2 weeks then apply twice a week. 09/01/14   [name], MD  fish oil-omega-3 fatty acids 1000 MG capsule Take 2 g by mouth daily.     Historical Provider, MD  Glucos-Chond-Sterol-Fish Oil (GLUCOSAMINE CHONDROITIN PLUS PO) Take 2 tablets by mouth daily.    Historical Provider, MD  losartan (COZAAR) 50 MG tablet take 1 tablet by mouth once daily    [name], MD  Magnesium 200 MG TABS Take 1 tablet by mouth daily.     Historical Provider, MD  Melatonin 10  MG CAPS Take 1 capsule by mouth as needed.    Historical Provider, MD  Multiple Vitamins-Minerals (PRESERVISION AREDS 2) CAPS Take 1 tablet by mouth 2 (two) times daily.    Historical Provider, MD  predniSONE (DELTASONE) 10 MG tablet Take one tab by mouth twice daily with food. 12/17/14   Kandra Nicolas, MD  Tiotropium Bromide Monohydrate (SPIRIVA RESPIMAT) 1.25 MCG/ACT AERS Inhale 2 puffs into the lungs daily. 09/10/14   [name], MD  vitamin C (ASCORBIC ACID) 500 MG tablet Take 500 mg by mouth daily.    Historical Provider, MD   BP 173/78 mmHg  Pulse 87  Temp(Src) 98.6 F (37 C) (Oral)  Resp 18  Ht 5\' 4"  (1.626 m)  Wt 127 lb (57.607 kg)  BMI 21.79 kg/m2  SpO2 98% Physical Exam Nursing notes and Vital Signs reviewed. Appearance:  Patient appears stated age, and in no acute distress Eyes:  Pupils are equal, round, and reactive to light and accomodation.  Extraocular movement is intact.  Conjunctivae are not inflamed  Ears:  Canals normal.  Tympanic membranes normal.  Nose:  Mildly congested turbinates.  No sinus tenderness. Pharynx:  Normal Neck:  Supple.  No adenopathy  Lungs:  Clear to auscultation.  Breath sounds are equal.  Heart:  Regular rate and rhythm without murmurs, rubs, or gallops.  Abdomen:  Nontender without masses or hepatosplenomegaly.  Bowel sounds are present.  No CVA or flank tenderness.  Extremities:  No edema.  No calf tenderness Skin:  No rash present.   ED Course  Procedures  none  MDM   1. Viral URI    There is no evidence of bacterial infection today.  Treat symptomatically for now  Begin prednisone burst.  Prescription written for Benzonatate (Tessalon) to take at bedtime for night-time cough.  Take plain guaifenesin (1200mg  extended release tabs such as Mucinex) twice daily, with plenty of water, for cough and congestion. Get adequate rest.  May use Afrin nasal spray (or generic oxymetazoline) twice daily for about 5 days.  Also recommend  using saline nasal spray several times daily and saline nasal irrigation (AYR is a common brand).   Try warm salt water gargles for sore throat.  Continue inhalers as prescribed. Stop all antihistamines for now, and other non-prescription cough/cold preparations. Begin doxycycline if not improving about one week or if persistent fever develops (Given a prescription to hold, with an expiration date)  Follow-up with family doctor if not improving about10 days    Kandra Nicolas, MD 12/21/14 2154

## 2014-12-22 ENCOUNTER — Telehealth: Payer: Self-pay | Admitting: Emergency Medicine

## 2014-12-31 ENCOUNTER — Encounter: Payer: Self-pay | Admitting: Family Medicine

## 2014-12-31 ENCOUNTER — Ambulatory Visit (INDEPENDENT_AMBULATORY_CARE_PROVIDER_SITE_OTHER): Payer: Medicare HMO | Admitting: Family Medicine

## 2014-12-31 VITALS — BP 122/72 | HR 70 | Ht 64.0 in | Wt 128.0 lb

## 2014-12-31 DIAGNOSIS — I10 Essential (primary) hypertension: Secondary | ICD-10-CM | POA: Diagnosis not present

## 2014-12-31 DIAGNOSIS — J449 Chronic obstructive pulmonary disease, unspecified: Secondary | ICD-10-CM

## 2014-12-31 DIAGNOSIS — J45990 Exercise induced bronchospasm: Secondary | ICD-10-CM | POA: Diagnosis not present

## 2014-12-31 LAB — BASIC METABOLIC PANEL
BUN: 17 mg/dL (ref 6–23)
CALCIUM: 9.6 mg/dL (ref 8.4–10.5)
CO2: 27 meq/L (ref 19–32)
Chloride: 104 mEq/L (ref 96–112)
Creat: 0.73 mg/dL (ref 0.50–1.10)
Glucose, Bld: 89 mg/dL (ref 70–99)
Potassium: 4.5 mEq/L (ref 3.5–5.3)
Sodium: 142 mEq/L (ref 135–145)

## 2014-12-31 NOTE — Progress Notes (Signed)
   Subjective:    Patient ID: [name], female    DOB: 01/17/1933, 81 y.o.   MRN: [id number]  HPI Hypertension- Pt denies chest pain, SOB, dizziness, or heart palpitations.  Taking meds as directed w/o problems.  Denies medication side effects.  She notices stress increases her BP.   Says doesn't watch her salt intake as much as she should.  No regular exercise.   COPD/ASthma - has had bad seasaonl allergies with congestion.  She is on spiriva.  Using her albuterol several times per week.  Yesterday she used her albuterol 2-3 times. She says it really just depends and it depends on if she is outside and around more seasonal allergens. She does take the Spiriva regularly.   Review of Systems     Objective:   Physical Exam  Constitutional: She is oriented to person, place, and time. She appears well-developed and well-nourished.  HENT:  Head: Normocephalic and atraumatic.  Cardiovascular: Normal rate, regular rhythm and normal heart sounds.   No carotid bruits.   Pulmonary/Chest: Effort normal and breath sounds normal.  Neurological: She is alert and oriented to person, place, and time.  Skin: Skin is warm and dry.  Psychiatric: She has a normal mood and affect. Her behavior is normal.          Assessment & Plan:  HTN - well controlled after repeat BP.  We'll continue to monitor carefully. Follow-up in 3 months.  COPD- Discussed since using her albuterol frequently. We discussed starting her back on Symbicort. She doesn't want to restart it at this point. She relly feels like most of her sxs are her allergies and wantsto focus on treatoing that.  I encouraged her to think about it and we discussed that she's using her albuterol more than 2-3 times per week that we really need to consider putting her on at least an inhaled corticosteroid. She says she will consider it but wants to get a couple more weeks to see if things settle down as far as her allergies are concerned. Follow-up  in 3 months.

## 2015-01-01 NOTE — Progress Notes (Signed)
Quick Note:  All labs are normal. ______ 

## 2015-02-04 ENCOUNTER — Other Ambulatory Visit: Payer: Self-pay | Admitting: Family Medicine

## 2015-02-11 ENCOUNTER — Ambulatory Visit: Payer: Medicare HMO | Admitting: Family Medicine

## 2015-03-09 ENCOUNTER — Encounter: Payer: Self-pay | Admitting: Family Medicine

## 2015-03-09 ENCOUNTER — Ambulatory Visit (INDEPENDENT_AMBULATORY_CARE_PROVIDER_SITE_OTHER): Payer: Medicare HMO | Admitting: Family Medicine

## 2015-03-09 VITALS — BP 139/79 | HR 65 | Wt 131.0 lb

## 2015-03-09 DIAGNOSIS — L02416 Cutaneous abscess of left lower limb: Secondary | ICD-10-CM | POA: Diagnosis not present

## 2015-03-09 DIAGNOSIS — L0291 Cutaneous abscess, unspecified: Secondary | ICD-10-CM | POA: Insufficient documentation

## 2015-03-09 MED ORDER — DOXYCYCLINE HYCLATE 100 MG PO TABS: 100.0000 mg | ORAL_TABLET | Freq: Two times a day (BID) | ORAL | Status: AC

## 2015-03-09 NOTE — Assessment & Plan Note (Signed)
Right lower shin abscess. Trial doxycycline. Not drainable.

## 2015-03-09 NOTE — Patient Instructions (Signed)
Thank you for coming in today. Return as needed.   Abscess An abscess is an infected area that contains a collection of pus and debris.It can occur in almost any part of the body. An abscess is also known as a furuncle or boil. CAUSES  An abscess occurs when tissue gets infected. This can occur from blockage of oil or sweat glands, infection of hair follicles, or a minor injury to the skin. As the body tries to fight the infection, pus collects in the area and creates pressure under the skin. This pressure causes pain. People with weakened immune systems have difficulty fighting infections and get certain abscesses more often.  SYMPTOMS Usually an abscess develops on the skin and becomes a painful mass that is red, warm, and tender. If the abscess forms under the skin, you may feel a moveable soft area under the skin. Some abscesses break open (rupture) on their own, but most will continue to get worse without care. The infection can spread deeper into the body and eventually into the bloodstream, causing you to feel ill.  DIAGNOSIS  Your caregiver will take your medical history and perform a physical exam. A sample of fluid may also be taken from the abscess to determine what is causing your infection. TREATMENT  Your caregiver may prescribe antibiotic medicines to fight the infection. However, taking antibiotics alone usually does not cure an abscess. Your caregiver may need to make a small cut (incision) in the abscess to drain the pus. In some cases, gauze is packed into the abscess to reduce pain and to continue draining the area. HOME CARE INSTRUCTIONS   Only take over-the-counter or prescription medicines for pain, discomfort, or fever as directed by your caregiver.  If you were prescribed antibiotics, take them as directed. Finish them even if you start to feel better.  If gauze is used, follow your caregiver's directions for changing the gauze.  To avoid spreading the  infection:  Keep your draining abscess covered with a bandage.  Wash your hands well.  Do not share personal care items, towels, or whirlpools with others.  Avoid skin contact with others.  Keep your skin and clothes clean around the abscess.  Keep all follow-up appointments as directed by your caregiver. SEEK MEDICAL CARE IF:   You have increased pain, swelling, redness, fluid drainage, or bleeding.  You have muscle aches, chills, or a general ill feeling.  You have a fever. MAKE SURE YOU:   Understand these instructions.  Will watch your condition.  Will get help right away if you are not doing well or get worse. Document Released: 04/27/2005 Document Revised: 01/17/2012 Document Reviewed: 09/30/2011 [facility] Patient Information 2015 [place], [place]. This information is not intended to replace advice given to you by your health care provider. Make sure you discuss any questions you have with your health care provider.

## 2015-03-09 NOTE — Progress Notes (Addendum)
[name] is a 81 y.o. female who presents to [facility]  today for abscess. Patient notes a small tender erythematous bump on her right shin. This is been present for about a week. It seems to be improving slowly. She notes it similar to previous episode of community-acquired MRSA abscess. She has not tried any treatment yet. No radiating pain weakness numbness fevers chills nausea vomiting or diarrhea.   Past Medical History  Diagnosis Date  . HTN (hypertension)   . Asthma, exercise induced   . Macular degeneration   . OP (osteoporosis)   . Difficulty hearing   . Problems related to lack of adequate sleep   . History of parathyroid disease   . Migraine headache without aura    Past Surgical History  Procedure Laterality Date  . Parathyroidectomy  09/06/04    Minimally invasive parathyroid surgery by Dr. Charise Killian in [place], [place].  . Anterior acromionectomy  07/31/00    left  . Repair of torn rotator cuff  07/31/00    left, and 2007  . Abdominal hysterectomy    . Breast lumpectomy  1990  . Cataract extraction  [id number]  . Tonsillectomy     History  Substance Use Topics  . Smoking status: Never Smoker   . Smokeless tobacco: Not on file  . Alcohol Use: No   ROS as above Medications: Current Outpatient Prescriptions  Medication Sig Dispense Refill  . albuterol (VENTOLIN HFA) 108 (90 BASE) MCG/ACT inhaler Inhale 2 puffs into the lungs every 6 (six) hours as needed. 1 Inhaler 3  . ALPRAZolam (XANAX) 0.5 MG tablet Take 1 tablet (0.5 mg total) by mouth at bedtime as needed for sleep. 30 tablet 1  . amLODipine (NORVASC) 2.5 MG tablet take 1 tablet by mouth once daily 90 tablet 3  . aspirin 81 MG tablet Take 81 mg by mouth daily.    . B Complex Vitamins (VITAMIN B COMPLEX PO) Take 1 tablet by mouth every other day.    . calcium citrate-vitamin D (CITRACAL+D) 315-200 MG-UNIT per tablet Take 1 tablet by mouth 2 (two) times daily.     .  Cholecalciferol (VITAMIN D-3) 1000 UNITS CAPS Take 2 capsules by mouth 2 (two) times daily.    . fish oil-omega-3 fatty acids 1000 MG capsule Take 2 g by mouth daily.     . Glucos-Chond-Sterol-Fish Oil (GLUCOSAMINE CHONDROITIN PLUS PO) Take 2 tablets by mouth daily.    Marland Kitchen losartan (COZAAR) 50 MG tablet take 1 tablet by mouth once daily 90 tablet 3  . Magnesium 200 MG TABS Take 1 tablet by mouth daily.     . Melatonin 10 MG CAPS Take 1 capsule by mouth as needed.    . Multiple Vitamins-Minerals (PRESERVISION AREDS 2) CAPS Take 1 tablet by mouth 2 (two) times daily.    . Tiotropium Bromide Monohydrate (SPIRIVA RESPIMAT) 1.25 MCG/ACT AERS Inhale 2 puffs into the lungs daily. 4 g 11  . vitamin C (ASCORBIC ACID) 500 MG tablet Take 500 mg by mouth daily.    Marland Kitchen doxycycline (VIBRA-TABS) 100 MG tablet Take 1 tablet (100 mg total) by mouth 2 (two) times daily. 14 tablet 0   No current facility-administered medications for this visit.   Allergies  Allergen Reactions  . Erythromycin Swelling  . Penicillins Swelling and Rash     Exam:  BP 139/79 mmHg  Pulse 65  Wt 131 lb (59.421 kg) Gen: Well NAD Skin: Erythematous tender nodule right  shin without fluctuance. No surrounding erythema or induration. Exts: Brisk capillary refill, warm and well perfused.   No results found for this or any previous visit (from the past 24 hour(s)). No results found.   Please see individual assessment and plan sections.

## 2015-04-08 ENCOUNTER — Ambulatory Visit: Payer: Medicare HMO | Admitting: Family Medicine

## 2015-05-07 DIAGNOSIS — H35353 Cystoid macular degeneration, bilateral: Secondary | ICD-10-CM | POA: Diagnosis not present

## 2015-05-07 DIAGNOSIS — H524 Presbyopia: Secondary | ICD-10-CM | POA: Diagnosis not present

## 2015-05-14 ENCOUNTER — Ambulatory Visit (INDEPENDENT_AMBULATORY_CARE_PROVIDER_SITE_OTHER): Payer: Medicare HMO | Admitting: Sports Medicine

## 2015-05-14 VITALS — Temp 97.8°F

## 2015-05-14 DIAGNOSIS — Z23 Encounter for immunization: Secondary | ICD-10-CM

## 2015-07-08 ENCOUNTER — Ambulatory Visit (INDEPENDENT_AMBULATORY_CARE_PROVIDER_SITE_OTHER): Payer: Medicare HMO | Admitting: Family Medicine

## 2015-07-08 ENCOUNTER — Telehealth: Payer: Self-pay | Admitting: Family Medicine

## 2015-07-08 ENCOUNTER — Encounter: Payer: Self-pay | Admitting: Family Medicine

## 2015-07-08 VITALS — BP 160/70 | HR 76 | Temp 97.6°F | Resp 16 | Wt 123.4 lb

## 2015-07-08 DIAGNOSIS — Z0000 Encounter for general adult medical examination without abnormal findings: Secondary | ICD-10-CM

## 2015-07-08 DIAGNOSIS — I10 Essential (primary) hypertension: Secondary | ICD-10-CM

## 2015-07-08 LAB — COMPLETE METABOLIC PANEL WITH GFR
ALBUMIN: 4.5 g/dL (ref 3.6–5.1)
ALT: 17 U/L (ref 6–29)
AST: 22 U/L (ref 10–35)
Alkaline Phosphatase: 85 U/L (ref 33–130)
BUN: 19 mg/dL (ref 7–25)
CHLORIDE: 105 mmol/L (ref 98–110)
CO2: 26 mmol/L (ref 20–31)
Calcium: 9.9 mg/dL (ref 8.6–10.4)
Creat: 0.77 mg/dL (ref 0.60–0.88)
GFR, Est African American: 84 mL/min (ref >=60)
GFR, Est Non African American: 73 mL/min (ref >=60)
Glucose, Bld: 88 mg/dL (ref 65–99)
Potassium: 4 mmol/L (ref 3.5–5.3)
Sodium: 142 mmol/L (ref 135–146)
Total Bilirubin: 0.6 mg/dL (ref 0.2–1.2)
Total Protein: 7 g/dL (ref 6.1–8.1)

## 2015-07-08 LAB — LIPID PANEL
Cholesterol: 226 mg/dL — ABNORMAL HIGH (ref 125–200)
HDL: 76 mg/dL (ref >=46)
LDL Cholesterol: 130 mg/dL — ABNORMAL HIGH (ref <130)
Total CHOL/HDL Ratio: 3 Ratio (ref <=5.0)
Triglycerides: 100 mg/dL (ref <150)
VLDL: 20 mg/dL (ref <30)

## 2015-07-08 NOTE — Telephone Encounter (Signed)
Spoke to patient gave her instructions as noted below. Advised patient to follow in 1 month for blood pressure. Deacon Gadbois,CMA

## 2015-07-08 NOTE — Patient Instructions (Signed)
Keep up a regular exercise program and make sure you are eating a healthy diet Try to eat 4 servings of dairy a day, or if you are lactose intolerant take a calcium with vitamin D daily.  Your vaccines are up to date.   

## 2015-07-08 NOTE — Progress Notes (Signed)
Subjective:    [name] is a 81 y.o. female who presents for Medicare Annual/Subsequent preventive examination.  Preventive Screening-Counseling & Management  Tobacco History  Smoking status  . Never Smoker   Smokeless tobacco  . Not on file     Problems Prior to Visit 1. HTN - no chest pain or short of breath. Her home blood pressures actually have a fairly wide range. Her highest pressure was 173/73. Her lowest pressure was 90/50. She does check her blood pressure multiple times during the week. She says most of the time is well-controlled.   Current Problems (verified) Patient Active Problem List   Diagnosis Date Noted  . Abscess of skin 03/09/2015  . Essential hypertension 03/10/2014  . Fibrocystic breast changes 06/16/2012  . 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 Vitamin D deficiency 06/15/2012  . Internal hemorrhoid 06/15/2012  . COPD (chronic obstructive pulmonary disease) ([place]) 06/15/2012  . Palpitations 03/29/2012  . Asthma, exercise induced   . Macular degeneration     Medications Prior to Visit Current Outpatient Prescriptions on File Prior to Visit  Medication Sig Dispense Refill  . albuterol (VENTOLIN HFA) 108 (90 BASE) MCG/ACT inhaler Inhale 2 puffs into the lungs every 6 (six) hours as needed. 1 Inhaler 3  . ALPRAZolam (XANAX) 0.5 MG tablet Take 1 tablet (0.5 mg total) by mouth at bedtime as needed for sleep. 30 tablet 1  . amLODipine (NORVASC) 2.5 MG tablet take 1 tablet by mouth once daily 90 tablet 3  . aspirin 81 MG tablet Take 81 mg by mouth daily.    . B Complex Vitamins (VITAMIN B COMPLEX PO) Take 1 tablet by mouth every other day.    . calcium citrate-vitamin D (CITRACAL+D) 315-200 MG-UNIT per tablet Take 1 tablet by mouth 2 (two) times daily.     . Cholecalciferol (VITAMIN D-3) 1000 UNITS CAPS Take 2 capsules by mouth 2 (two) times daily.    . fish oil-omega-3 fatty acids 1000 MG capsule Take 2 g by mouth daily.     .  Glucos-Chond-Sterol-Fish Oil (GLUCOSAMINE CHONDROITIN PLUS PO) Take 2 tablets by mouth daily.    Marland Kitchen losartan (COZAAR) 50 MG tablet take 1 tablet by mouth once daily 90 tablet 3  . Magnesium 200 MG TABS Take 1 tablet by mouth daily.     . Melatonin 10 MG CAPS Take 1 capsule by mouth as needed.    . Multiple Vitamins-Minerals (PRESERVISION AREDS 2) CAPS Take 1 tablet by mouth 2 (two) times daily.    . Tiotropium Bromide Monohydrate (SPIRIVA RESPIMAT) 1.25 MCG/ACT AERS Inhale 2 puffs into the lungs daily. 4 g 11  . vitamin C (ASCORBIC ACID) 500 MG tablet Take 500 mg by mouth daily.     No current facility-administered medications on file prior to visit.    Current Medications (verified) Current Outpatient Prescriptions  Medication Sig Dispense Refill  . albuterol (VENTOLIN HFA) 108 (90 BASE) MCG/ACT inhaler Inhale 2 puffs into the lungs every 6 (six) hours as needed. 1 Inhaler 3  . ALPRAZolam (XANAX) 0.5 MG tablet Take 1 tablet (0.5 mg total) by mouth at bedtime as needed for sleep. 30 tablet 1  . amLODipine (NORVASC) 2.5 MG tablet take 1 tablet by mouth once daily 90 tablet 3  . aspirin 81 MG tablet Take 81 mg by mouth daily.    . B Complex Vitamins (VITAMIN B COMPLEX PO) Take 1 tablet by mouth every other day.    . calcium citrate-vitamin  D (CITRACAL+D) 315-200 MG-UNIT per tablet Take 1 tablet by mouth 2 (two) times daily.     . Cholecalciferol (VITAMIN D-3) 1000 UNITS CAPS Take 2 capsules by mouth 2 (two) times daily.    . fish oil-omega-3 fatty acids 1000 MG capsule Take 2 g by mouth daily.     . Glucos-Chond-Sterol-Fish Oil (GLUCOSAMINE CHONDROITIN PLUS PO) Take 2 tablets by mouth daily.    Marland Kitchen losartan (COZAAR) 50 MG tablet take 1 tablet by mouth once daily 90 tablet 3  . Magnesium 200 MG TABS Take 1 tablet by mouth daily.     . Melatonin 10 MG CAPS Take 1 capsule by mouth as needed.    . Multiple Vitamins-Minerals (PRESERVISION AREDS 2) CAPS Take 1 tablet by mouth 2 (two) times daily.     . Tiotropium Bromide Monohydrate (SPIRIVA RESPIMAT) 1.25 MCG/ACT AERS Inhale 2 puffs into the lungs daily. 4 g 11  . vitamin C (ASCORBIC ACID) 500 MG tablet Take 500 mg by mouth daily.     No current facility-administered medications for this visit.     Allergies (verified) Erythromycin and Penicillins   PAST HISTORY  Family History Family History  Problem Relation Age of Onset  . Heart disease Father     Social History Social History  Substance Use Topics  . Smoking status: Never Smoker   . Smokeless tobacco: Not on file  . Alcohol Use: No     Are there smokers in your home (other than you)? No  Risk Factors Current exercise habits: The patient does not participate in regular exercise at present.  Dietary issues discussed: None   Cardiac risk factors: advanced age (older than 105 for men, 37 for women) and hypertension.  Depression Screen (Note: if answer to either of the following is "Yes", a more complete depression screening is indicated)   Over the past two weeks, have you felt down, depressed or hopeless? No  Over the past two weeks, have you felt little interest or pleasure in doing things? No  Have you lost interest or pleasure in daily life? No  Do you often feel hopeless? No  Do you cry easily over simple problems? No  Activities of Daily Living In your present state of health, do you have any difficulty performing the following activities?:  Driving? Yes Managing money?  No Feeding yourself? No Getting from bed to chair? No   Climbing a flight of stairs? No Preparing food and eating?: No Bathing or showering? No Getting dressed: No Getting to the toilet? No Using the toilet:No Moving around from place to place: No In the past year have you fallen or had a near fall?:No   Hearing Difficulties: No Do you often ask people to speak up or repeat themselves? Yes Do you experience ringing or noises in your ears? Yes Do you have difficulty understanding  soft or whispered voices? Yes   Do you feel that you have a problem with memory? No  Do you often misplace items? No  Do you feel safe at home?  Yes  Cognitive Testing  Alert? Yes  Normal Appearance?Yes  Oriented to person? Yes  Place? Yes   Time? Yes  Recall of three objects?  Yes  Can perform simple calculations? Yes  Displays appropriate judgment?Yes  Can read the correct time from a watch face?Yes    6 CIT score of 0/28 ( normal )    Advanced Directives have been discussed with the patient? Yes  List the Names  of Other Physician/Practitioners you currently use: 1.  Dr. Jodi Mourning - Optometry 2. Dr. Tonia Brooms - Derm 3. Dr. Donovan Kail -  4. Dr. Gilford Rile -   Indicate any recent Medical Services you may have received from other than [facility] providers in the past year (date may be approximate).  Immunization History  Administered Date(s) Administered  . Influenza Split 05/07/2012  . Influenza, High Dose Seasonal PF 05/28/2013  . Influenza,inj,Quad PF,36+ Mos 04/23/2014, 05/14/2015  . Pneumococcal Conjugate-13 07/01/2014  . Pneumococcal Polysaccharide-23 08/01/2005  . Td 08/01/2001  . Tdap 08/01/2005    Screening Tests Health Maintenance  Topic Date Due  . ZOSTAVAX  11/03/1994  . MAMMOGRAM  07/10/2015  . TETANUS/TDAP  08/02/2015  . INFLUENZA VACCINE  03/01/2016  . DEXA SCAN  Completed  . PNA vac Low Risk Adult  Completed    All answers were reviewed with the patient and necessary referrals were made:  METHENEY,CATHERINE, MD   07/08/2015   History reviewed: allergies, current medications, past family history, past medical history, past social history, past surgical history and problem list  Review of Systems Pertinent items noted in HPI and remainder of comprehensive ROS otherwise negative.    Objective:     Vision by Snellen chart: Sees Dr. Jodi Mourning  Body mass index is 21.17 kg/(m^2). BP 152/74 mmHg  Pulse 76  Temp(Src) 97.6 F (36.4 C)  Resp 16  Wt 123 lb 6.4 oz  (55.974 kg)  SpO2 100%  BP 152/74 mmHg  Pulse 76  Temp(Src) 97.6 F (36.4 C)  Resp 16  Wt 123 lb 6.4 oz (55.974 kg)  SpO2 100% General appearance: alert, cooperative and appears stated age Head: Normocephalic, without obvious abnormality, atraumatic Eyes: conj clear, EOMi, PEERLA Ears: normal TM's and external ear canals both ears Nose: Nares normal. Septum midline. Mucosa normal. No drainage or sinus tenderness. Throat: lips, mucosa, and tongue normal; teeth and gums normal Neck: no adenopathy, no carotid bruit, no JVD, supple, symmetrical, trachea midline and thyroid not enlarged, symmetric, no tenderness/mass/nodules Back: symmetric, no curvature. ROM normal. No CVA tenderness. Lungs: clear to auscultation bilaterally Breasts: normal appearance, no masses or tenderness Heart: regular rate and rhythm, S1, S2 normal, no murmur, click, rub or gallop Abdomen: soft, non-tender; bowel sounds normal; no masses,  no organomegaly Extremities: extremities normal, atraumatic, no cyanosis or edema Pulses: 2+ and symmetric Skin: Skin color, texture, turgor normal. No rashes or lesions Lymph nodes: Cervical, supraclavicular, and axillary nodes normal. Neurologic: Alert and oriented X 3, normal strength and tone. Normal symmetric reflexes. Normal coordination and gait     Assessment:      Annual Medicare Wellness Exam      Plan:     During the course of the visit the patient was educated and counseled about appropriate screening and preventive services including:    HTN - overall well controlled.  BPs do swing.  Highest is 173/73 and her lowest.   Diet review for nutrition referral? Yes ____  Not Indicated __X__   Patient Instructions (the written plan) was given to the patient.  Medicare Attestation I have personally reviewed: The patient's medical and social history Their use of alcohol, tobacco or illicit drugs Their current medications and supplements The patient's  functional ability including ADLs,fall risks, home safety risks, cognitive, and hearing and visual impairment Diet and physical activities Evidence for depression or mood disorders  The patient's weight, height, BMI, and visual acuity have been recorded in the chart.  I have made referrals, counseling,  and provided education to the patient based on review of the above and I have provided the patient with a written personalized care plan for preventive services.     METHENEY,CATHERINE, MD   07/08/2015

## 2015-07-08 NOTE — Telephone Encounter (Signed)
Please call pt and let her know since repeat BP was high iwould like to have her come back for BP check in one month.

## 2015-08-07 ENCOUNTER — Ambulatory Visit (INDEPENDENT_AMBULATORY_CARE_PROVIDER_SITE_OTHER): Payer: Medicare HMO | Admitting: Family Medicine

## 2015-08-07 ENCOUNTER — Encounter: Payer: Self-pay | Admitting: Family Medicine

## 2015-08-07 VITALS — BP 159/60 | HR 68 | Temp 97.5°F | Resp 18 | Wt 128.8 lb

## 2015-08-07 DIAGNOSIS — R1031 Right lower quadrant pain: Secondary | ICD-10-CM | POA: Diagnosis not present

## 2015-08-07 DIAGNOSIS — I10 Essential (primary) hypertension: Secondary | ICD-10-CM

## 2015-08-07 MED ORDER — AMLODIPINE BESYLATE 5 MG PO TABS: 2.5000 mg | ORAL_TABLET | Freq: Every day | ORAL | Status: DC

## 2015-08-07 MED ORDER — LOSARTAN POTASSIUM 50 MG PO TABS: 50.0000 mg | ORAL_TABLET | Freq: Every day | ORAL | Status: DC

## 2015-08-07 NOTE — Progress Notes (Signed)
   Subjective:    Patient ID: [name], female    DOB: [birth year], 82 y.o.   MRN: [id number]  HPI C/o of feeling of "stitch" in her side. Worse with getting out of the car and laying dow. Says it would feel more sharp and then readiated out.  Started about a week ago. Wednesday night was the worse.  She did start walking recently.  No change in bowels. No fever or chills or sweats.  No dysuria.  Sitting down and resting does seem to relieve the pain when it's bothering her. No nausea or vomiting. She thinks she had her ovaries removed when she had her hysterectomy but is not 100% sure.  No urinary sxs.    Hypertension- Pt denies chest pain, SOB, dizziness, or heart palpitations.  Taking meds as directed w/o problems.  Denies medication side effects.     Review of Systems     Objective:   Physical Exam  Constitutional: She is oriented to person, place, and time. She appears well-developed and well-nourished.  HENT:  Head: Normocephalic and atraumatic.  Right Ear: External ear normal.  Left Ear: External ear normal.  Nose: Nose normal.  Mouth/Throat: Oropharynx is clear and moist.  Eyes: Conjunctivae and EOM are normal. Pupils are equal, round, and reactive to light.  Neck: Neck supple. No thyromegaly present.  Cardiovascular: Normal rate, regular rhythm and normal heart sounds.   Pulmonary/Chest: Effort normal and breath sounds normal. She has no wheezes.  Abdominal: Soft. Bowel sounds are normal. She exhibits no distension and no mass. There is tenderness. There is no rebound and no guarding.  Vertical abdominal incision is well-healed at the midline. She is tender with deep palpation in the right lower quadrant. No palpable masses etc. No rebound tenderness. No guarding. No radiation of pain.  Lymphadenopathy:    She has no cervical adenopathy.  Neurological: She is alert and oriented to person, place, and time.  Skin: Skin is warm and dry.  Psychiatric: She has a normal mood and  affect.          Assessment & Plan:  RLQ pain - unclear etiology. She is tender on exam. Consider appendicitis versus obstipation versus gas. Based on her age her ovaries should be small and nonfunctioning ovaries. In fact she may have had them surgically removed. No red flag symptoms today but since she is tender I" to schedule her for an ultrasound for further evaluation. If this is normal then consider CT 09/05/2006 for appendicitis. Encouraged her to go to emergency department on the weekend if the pain becomes more intense she develops a fever or notices a change in bowel movements. Can use Tylenol as needed for pain control.  Hypertension-does have an element of whitecoat hypertension adjusting to go up when she is here. She has still had some elevated pressures in the 150s and 160s at home so I'm going to increase her amlodipine to 5 mg. Perception sent to the pharmacy. Follow up in 2 months to recheck pressure.

## 2015-08-12 ENCOUNTER — Other Ambulatory Visit: Payer: Medicare HMO

## 2015-08-12 ENCOUNTER — Ambulatory Visit: Payer: Medicare HMO

## 2015-09-15 ENCOUNTER — Other Ambulatory Visit: Payer: Self-pay | Admitting: *Deleted

## 2015-09-15 MED ORDER — AMLODIPINE BESYLATE 5 MG PO TABS: 5.0000 mg | ORAL_TABLET | Freq: Every day | ORAL | Status: DC

## 2015-09-24 DIAGNOSIS — R69 Illness, unspecified: Secondary | ICD-10-CM | POA: Diagnosis not present

## 2015-10-05 ENCOUNTER — Ambulatory Visit (INDEPENDENT_AMBULATORY_CARE_PROVIDER_SITE_OTHER): Payer: Medicare HMO | Admitting: Family Medicine

## 2015-10-05 ENCOUNTER — Encounter: Payer: Self-pay | Admitting: Family Medicine

## 2015-10-05 VITALS — BP 135/79 | HR 75 | Wt 128.7 lb

## 2015-10-05 DIAGNOSIS — Z23 Encounter for immunization: Secondary | ICD-10-CM | POA: Diagnosis not present

## 2015-10-05 DIAGNOSIS — J449 Chronic obstructive pulmonary disease, unspecified: Secondary | ICD-10-CM

## 2015-10-05 DIAGNOSIS — I10 Essential (primary) hypertension: Secondary | ICD-10-CM

## 2015-10-05 MED ORDER — AMBULATORY NON FORMULARY MEDICATION: Status: DC

## 2015-10-05 MED ORDER — TIOTROPIUM BROMIDE MONOHYDRATE 1.25 MCG/ACT IN AERS: 2.0000 | INHALATION_SPRAY | Freq: Every day | RESPIRATORY_TRACT | Status: DC

## 2015-10-05 NOTE — Patient Instructions (Signed)
Can also try OTC magnesium as needed for your headaches.

## 2015-10-05 NOTE — Progress Notes (Signed)
   Subjective:    Patient ID: [name], female    DOB: Mar 11, 1933, 82 y.o.   MRN: [id number]  HPI Hypertension- Pt denies chest pain, SOB, dizziness, or heart palpitations.  Taking meds as directed w/o problems.  Denies medication side effects.  She is currently on amlodipine 5 mg, losartan 50 mg. She's had problems in the past with her blood pressure swinging up and down.she brought in home blood pressure readings today.  COPD-she's doing well on her current regimen. She does need a new prescription sent for her Spiriva. No recent exacerbations.  Review of Systems     Objective:   Physical Exam  Constitutional: She is oriented to person, place, and time. She appears well-developed and well-nourished.  HENT:  Head: Normocephalic and atraumatic.  Cardiovascular: Normal rate, regular rhythm and normal heart sounds.   Pulmonary/Chest: Effort normal and breath sounds normal.  Neurological: She is alert and oriented to person, place, and time.  Skin: Skin is warm and dry.  Psychiatric: She has a normal mood and affect. Her behavior is normal.          Assessment & Plan:  HTN- well-controlled. Continue current regimen. Lab work is up-to-date. On review of her blood pressure she had a few at home that have been high over the last 3 months but overall they're fairly well controlled. Continue work on diet and exercising it back into routine walking.  COPD-stable with no recent flares. Refilled her Spiriva today.  Due for Td. Prescription given to have it done at the pharmacy.

## 2015-10-07 DIAGNOSIS — Z23 Encounter for immunization: Secondary | ICD-10-CM | POA: Diagnosis not present

## 2015-11-10 DIAGNOSIS — H35371 Puckering of macula, right eye: Secondary | ICD-10-CM | POA: Diagnosis not present

## 2015-11-18 DIAGNOSIS — K136 Irritative hyperplasia of oral mucosa: Secondary | ICD-10-CM | POA: Diagnosis not present

## 2015-11-25 DIAGNOSIS — K136 Irritative hyperplasia of oral mucosa: Secondary | ICD-10-CM | POA: Diagnosis not present

## 2015-12-02 DIAGNOSIS — L821 Other seborrheic keratosis: Secondary | ICD-10-CM | POA: Diagnosis not present

## 2015-12-02 DIAGNOSIS — D225 Melanocytic nevi of trunk: Secondary | ICD-10-CM | POA: Diagnosis not present

## 2015-12-02 DIAGNOSIS — Z85828 Personal history of other malignant neoplasm of skin: Secondary | ICD-10-CM | POA: Diagnosis not present

## 2016-01-06 ENCOUNTER — Ambulatory Visit: Payer: Medicare HMO | Admitting: Family Medicine

## 2016-01-20 DIAGNOSIS — Z7982 Long term (current) use of aspirin: Secondary | ICD-10-CM | POA: Diagnosis not present

## 2016-01-20 DIAGNOSIS — Z880 Allergy status to penicillin: Secondary | ICD-10-CM | POA: Diagnosis not present

## 2016-01-20 DIAGNOSIS — M791 Myalgia: Secondary | ICD-10-CM | POA: Diagnosis not present

## 2016-01-20 DIAGNOSIS — R69 Illness, unspecified: Secondary | ICD-10-CM | POA: Diagnosis not present

## 2016-01-20 DIAGNOSIS — J449 Chronic obstructive pulmonary disease, unspecified: Secondary | ICD-10-CM | POA: Diagnosis not present

## 2016-01-20 DIAGNOSIS — T63091A Toxic effect of venom of other snake, accidental (unintentional), initial encounter: Secondary | ICD-10-CM | POA: Diagnosis not present

## 2016-01-20 DIAGNOSIS — R2231 Localized swelling, mass and lump, right upper limb: Secondary | ICD-10-CM | POA: Diagnosis not present

## 2016-01-20 DIAGNOSIS — Z90710 Acquired absence of both cervix and uterus: Secondary | ICD-10-CM | POA: Diagnosis not present

## 2016-01-20 DIAGNOSIS — Z79899 Other long term (current) drug therapy: Secondary | ICD-10-CM | POA: Diagnosis not present

## 2016-01-20 DIAGNOSIS — Y93H2 Activity, gardening and landscaping: Secondary | ICD-10-CM | POA: Diagnosis not present

## 2016-01-20 DIAGNOSIS — T63001A Toxic effect of unspecified snake venom, accidental (unintentional), initial encounter: Secondary | ICD-10-CM | POA: Diagnosis not present

## 2016-01-20 DIAGNOSIS — J302 Other seasonal allergic rhinitis: Secondary | ICD-10-CM | POA: Diagnosis not present

## 2016-01-20 DIAGNOSIS — I10 Essential (primary) hypertension: Secondary | ICD-10-CM | POA: Diagnosis not present

## 2016-01-20 DIAGNOSIS — R200 Anesthesia of skin: Secondary | ICD-10-CM | POA: Diagnosis not present

## 2016-02-11 ENCOUNTER — Ambulatory Visit (INDEPENDENT_AMBULATORY_CARE_PROVIDER_SITE_OTHER): Payer: Medicare HMO | Admitting: Family Medicine

## 2016-02-11 ENCOUNTER — Encounter: Payer: Self-pay | Admitting: Family Medicine

## 2016-02-11 VITALS — BP 133/71 | HR 58 | Wt 127.0 lb

## 2016-02-11 DIAGNOSIS — G4700 Insomnia, unspecified: Secondary | ICD-10-CM | POA: Diagnosis not present

## 2016-02-11 DIAGNOSIS — R599 Enlarged lymph nodes, unspecified: Secondary | ICD-10-CM

## 2016-02-11 DIAGNOSIS — R591 Generalized enlarged lymph nodes: Secondary | ICD-10-CM

## 2016-02-11 DIAGNOSIS — T63004A Toxic effect of unspecified snake venom, undetermined, initial encounter: Secondary | ICD-10-CM

## 2016-02-11 DIAGNOSIS — I10 Essential (primary) hypertension: Secondary | ICD-10-CM

## 2016-02-11 DIAGNOSIS — J449 Chronic obstructive pulmonary disease, unspecified: Secondary | ICD-10-CM

## 2016-02-11 DIAGNOSIS — T63001A Toxic effect of unspecified snake venom, accidental (unintentional), initial encounter: Secondary | ICD-10-CM

## 2016-02-11 MED ORDER — ALPRAZOLAM 0.25 MG PO TABS: 0.1250 mg | ORAL_TABLET | Freq: Every evening | ORAL | Status: DC | PRN

## 2016-02-11 NOTE — Progress Notes (Signed)
Subjective:    CC: HTN  HPI:  Hypertension- Pt denies chest pain, SOB, dizziness, or heart palpitations.  Taking meds as directed w/o problems.  Denies medication side effects.    COPD - currently on Spirva and as needed albuterol. She says she hasn't had use her albuterol in quite some time. She has not had any flares the spring or summer.  She was recently bitten by a copper head on 01/20/16.  He is overall doing much better but she still has a lot of heat and warmth and redness over the hand and forearm. It's still swollen but she says it is better than it was before. She says if she bumps it or hits it it's very tender but otherwise is not causing a lot of pain. She has been in contact with poison control a couple of times after her ED visit with [facility]. She did notice a little nodule over her proximal clavicle on the right side and wanted me to take a look at it today. It's a little bit tender to touch. Next  Insomnia-she occasionally will use a quarter of a tab of Xanax for sleep. 30 tabs usually lasts for more than a year. She would like a refill on that today.  Past medical history, Surgical history, Family history not pertinant except as noted below, Social history, Allergies, and medications have been entered into the medical record, reviewed, and corrections made.   Review of Systems: No fevers, chills, night sweats, weight loss, chest pain, or shortness of breath.   Objective:    General: Well Developed, well nourished, and in no acute distress.  Neuro: Alert and oriented x3, extra-ocular muscles intact, sensation grossly intact.  HEENT: Normocephalic, atraumatic, does have what feels like a mildly swollen lymph node at the proximal right end of the clavicle. She also has a small anterior cervical lymph node that is swollen on the right side of her neck as well.  Skin: Warm and dry, no rashes. Cardiac: Regular rate and rhythm, no murmurs rubs or gallops, no lower extremity edema.   Respiratory: Clear to auscultation bilaterally. Not using accessory muscles, speaking in full sentences.   Impression and Recommendations:    HTN - Well controlled. Continue current regimen. Follow up in 6 months.   COPD - stable. No recent exacerbations. Continue with Spiriva daily and as needed albuterol.  Poisonous snake bite-doing well but still has some swelling and erythema. Continue to monitor for secondary infection.  She does have a swollen lymph node or at least that's what appears to be over the proximal right clavicle. Encouraged her to keep an eye on it over the next couple of weeks and if it's not resolving or if it suddenly becomes larger or becomes red or tender to please let us know and we can get an ultrasound for confirmation. Next  Insomnia-did refill Xanax. Decrease her dose to 0.25 so that she can break the tablet in half instead of ordering it. Reminded her about potential side effects of benzodiazepines including sedation increased risk for falls and increased risk for developing dementia. Reminded her to use it sparingly.

## 2016-02-12 LAB — BASIC METABOLIC PANEL WITH GFR
BUN: 17 mg/dL (ref 7–25)
CALCIUM: 9.2 mg/dL (ref 8.6–10.4)
CO2: 26 mmol/L (ref 20–31)
CREATININE: 0.85 mg/dL (ref 0.60–0.88)
Chloride: 105 mmol/L (ref 98–110)
GFR, EST AFRICAN AMERICAN: 74 mL/min (ref >=60)
GFR, Est Non African American: 64 mL/min (ref >=60)
Glucose, Bld: 95 mg/dL (ref 65–99)
Potassium: 4.1 mmol/L (ref 3.5–5.3)
SODIUM: 137 mmol/L (ref 135–146)

## 2016-02-12 NOTE — Progress Notes (Signed)
Quick Note:  All labs are normal. ______ 

## 2016-03-20 ENCOUNTER — Other Ambulatory Visit: Payer: Self-pay | Admitting: Family Medicine

## 2016-04-26 ENCOUNTER — Other Ambulatory Visit: Payer: Self-pay | Admitting: Family Medicine

## 2016-05-09 DIAGNOSIS — Z23 Encounter for immunization: Secondary | ICD-10-CM | POA: Diagnosis not present

## 2016-05-11 DIAGNOSIS — H35031 Hypertensive retinopathy, right eye: Secondary | ICD-10-CM | POA: Diagnosis not present

## 2016-05-11 DIAGNOSIS — H353131 Nonexudative age-related macular degeneration, bilateral, early dry stage: Secondary | ICD-10-CM | POA: Diagnosis not present

## 2016-05-11 DIAGNOSIS — H524 Presbyopia: Secondary | ICD-10-CM | POA: Diagnosis not present

## 2016-05-11 DIAGNOSIS — Z0100 Encounter for examination of eyes and vision without abnormal findings: Secondary | ICD-10-CM | POA: Diagnosis not present

## 2016-06-15 DIAGNOSIS — H43813 Vitreous degeneration, bilateral: Secondary | ICD-10-CM | POA: Diagnosis not present

## 2016-06-15 DIAGNOSIS — H35373 Puckering of macula, bilateral: Secondary | ICD-10-CM | POA: Diagnosis not present

## 2016-06-15 DIAGNOSIS — H3552 Pigmentary retinal dystrophy: Secondary | ICD-10-CM | POA: Diagnosis not present

## 2016-06-15 DIAGNOSIS — D3132 Benign neoplasm of left choroid: Secondary | ICD-10-CM | POA: Diagnosis not present

## 2016-06-21 ENCOUNTER — Telehealth: Payer: Self-pay | Admitting: Family Medicine

## 2016-06-21 NOTE — Telephone Encounter (Signed)
Call pt: her Spiriva will no longer be covered.  We will have to change to Advair, Breo, Incruse, or Anoro.  See is she is ok with me changing it.   Beatrice Lecher, MD

## 2016-06-21 NOTE — Telephone Encounter (Signed)
Pt notified and stated that she has enough to last her til her visit in Dec. She will discuss this with you during that visit.

## 2016-07-13 ENCOUNTER — Ambulatory Visit (INDEPENDENT_AMBULATORY_CARE_PROVIDER_SITE_OTHER): Payer: Medicare HMO | Admitting: Family Medicine

## 2016-07-13 ENCOUNTER — Encounter: Payer: Self-pay | Admitting: Family Medicine

## 2016-07-13 VITALS — BP 148/76 | HR 66 | Ht 66.0 in | Wt 122.0 lb

## 2016-07-13 DIAGNOSIS — R69 Illness, unspecified: Secondary | ICD-10-CM | POA: Diagnosis not present

## 2016-07-13 DIAGNOSIS — G4700 Insomnia, unspecified: Secondary | ICD-10-CM | POA: Insufficient documentation

## 2016-07-13 DIAGNOSIS — Z0000 Encounter for general adult medical examination without abnormal findings: Secondary | ICD-10-CM

## 2016-07-13 DIAGNOSIS — I10 Essential (primary) hypertension: Secondary | ICD-10-CM

## 2016-07-13 DIAGNOSIS — J449 Chronic obstructive pulmonary disease, unspecified: Secondary | ICD-10-CM

## 2016-07-13 DIAGNOSIS — F5101 Primary insomnia: Secondary | ICD-10-CM

## 2016-07-13 LAB — COMPLETE METABOLIC PANEL WITH GFR
ALBUMIN: 4.2 g/dL (ref 3.6–5.1)
ALK PHOS: 79 U/L (ref 33–130)
ALT: 13 U/L (ref 6–29)
AST: 17 U/L (ref 10–35)
BILIRUBIN TOTAL: 0.5 mg/dL (ref 0.2–1.2)
BUN: 18 mg/dL (ref 7–25)
CALCIUM: 9.9 mg/dL (ref 8.6–10.4)
CO2: 22 mmol/L (ref 20–31)
CREATININE: 0.86 mg/dL (ref 0.60–0.88)
Chloride: 107 mmol/L (ref 98–110)
GFR, EST AFRICAN AMERICAN: 73 mL/min (ref >=60)
GFR, EST NON AFRICAN AMERICAN: 64 mL/min (ref >=60)
Glucose, Bld: 82 mg/dL (ref 65–99)
Potassium: 4.3 mmol/L (ref 3.5–5.3)
Sodium: 144 mmol/L (ref 135–146)
TOTAL PROTEIN: 6.8 g/dL (ref 6.1–8.1)

## 2016-07-13 LAB — LIPID PANEL
Cholesterol: 220 mg/dL — ABNORMAL HIGH (ref <200)
HDL: 73 mg/dL (ref >50)
LDL Cholesterol: 127 mg/dL — ABNORMAL HIGH (ref <100)
TRIGLYCERIDES: 100 mg/dL (ref <150)
Total CHOL/HDL Ratio: 3 Ratio (ref <5.0)
VLDL: 20 mg/dL (ref <30)

## 2016-07-13 MED ORDER — LOSARTAN POTASSIUM 100 MG PO TABS: 100.0000 mg | ORAL_TABLET | Freq: Every day | ORAL | 1 refills | Status: DC

## 2016-07-13 MED ORDER — UMECLIDINIUM-VILANTEROL 62.5-25 MCG/INH IN AEPB: 1.0000 | INHALATION_SPRAY | Freq: Every day | RESPIRATORY_TRACT | 11 refills | Status: DC

## 2016-07-13 NOTE — Progress Notes (Signed)
   Subjective:    Patient ID: [name], female    DOB: [birth year], 82 y.o.   MRN: [id number]  HPI COPD - Her know her Spiriva is no longer benefit be covered. She also let me know that she has been using her albuterol. She says that about 2 months ago she had an upper respiratory infection and was using her albuterol 2-3 times a day. She did finally recovered from that but ever since then she still been eating it at least once a day most days.  Hypertension- Pt denies chest pain, SOB, dizziness, or heart palpitations.  Taking meds as directed w/o problems.  Denies medication side effects.  Patient brought in home blood pressure log today.  Insomnia-She does still use her Xanax occasionally. On average about twice per week.   Review of Systems     Objective:   Physical Exam  Constitutional: She is oriented to person, place, and time. She appears well-developed and well-nourished.  HENT:  Head: Normocephalic and atraumatic.  Cardiovascular: Normal rate, regular rhythm and normal heart sounds.   Pulmonary/Chest: Effort normal and breath sounds normal.  Neurological: She is alert and oriented to person, place, and time.  Skin: Skin is warm and dry.  Psychiatric: She has a normal mood and affect. Her behavior is normal.       Assessment & Plan:   COPD-we will switch to Anoro which also has a long-acting albuterol so that she shouldn't have to use her rescue inhaler as often. Follow-up in 2-3 months for COPD.  Insomnia- she uses her xanax about twice a week to sleep. Says doesn't use them every night.    HTN - pressures are up and down. She has some great blood pressures and some that are really high, as high as a systolic of [id number] at home. She definitely feels like stress really bumps her blood pressure. Will increase her losartan to 100 mg daily.

## 2016-07-13 NOTE — Progress Notes (Signed)
Subjective:   [name] is a 82 y.o. female who presents for Medicare Annual (Subsequent) preventive examination.  Her know her Spiriva is no longer benefit be covered. She also let me know that she has been using her albuterol. She says that about 2 months ago she had an upper respiratory infection and was using her albuterol 2-3 times a day. She did finally recovered from that but ever since then she still been eating it at least once a day most days.  Review of Systems:  Appearance of review systems is negative.       Objective:     Vitals: BP (!) 148/76 (BP Location: Left Arm, Cuff Size: Normal)   Pulse 66   Ht 5\' 6"  (1.676 m)   Wt 122 lb (55.3 kg)   SpO2 99%   BMI 19.69 kg/m   Body mass index is 19.69 kg/m.  Physical Exam  Constitutional: She is oriented to person, place, and time. She appears well-developed and well-nourished.  HENT:  Head: Normocephalic and atraumatic.  Right Ear: External ear normal.  Left Ear: External ear normal.  Nose: Nose normal.  Mouth/Throat: Oropharynx is clear and moist.  TMs and canals are clear.   Eyes: Conjunctivae and EOM are normal. Pupils are equal, round, and reactive to light.  Neck: Neck supple. No thyromegaly present.  Cardiovascular: Normal rate, regular rhythm and normal heart sounds.   Pulmonary/Chest: Effort normal and breath sounds normal. She has no wheezes.  Abdominal: Soft. Bowel sounds are normal.  Lymphadenopathy:    She has no cervical adenopathy.  Neurological: She is alert and oriented to person, place, and time.  Skin: Skin is warm and dry. No pallor.  Psychiatric: She has a normal mood and affect. Her behavior is normal.  Vitals reviewed.    Tobacco History  Smoking Status  . Never Smoker  Smokeless Tobacco  . Not on file     Counseling given: Not Answered   Past Medical History:  Diagnosis Date  . Asthma, exercise induced   . Difficulty hearing   . History of parathyroid disease   . HTN  (hypertension)   . Macular degeneration   . Migraine headache without aura   . OP (osteoporosis)   . Problems related to lack of adequate sleep    Past Surgical History:  Procedure Laterality Date  . ABDOMINAL HYSTERECTOMY     she thinks ovaries removed as well.    Marland Kitchen Anterior acromionectomy  07/31/00   left  . BREAST LUMPECTOMY  1990  . CATARACT EXTRACTION  [id number]  . PARATHYROIDECTOMY  09/06/04   Minimally invasive parathyroid surgery by Dr. Charise Killian in [place], [place].  . repair of torn rotator cuff  07/31/00   left, and 2007  . TONSILLECTOMY     Family History  Problem Relation Age of Onset  . Heart disease Father    History  Sexual Activity  . Sexual activity: Yes  . Partners: Male    Outpatient Encounter Prescriptions as of 07/13/2016  Medication Sig  . albuterol (VENTOLIN HFA) 108 (90 BASE) MCG/ACT inhaler Inhale 2 puffs into the lungs every 6 (six) hours as needed.  . ALPRAZolam (XANAX) 0.25 MG tablet Take 0.5-2 tablets (0.125-0.5 mg total) by mouth at bedtime as needed for sleep.  . AMBULATORY NON FORMULARY MEDICATION Medication Name: Tdap IM x 1  . amLODipine (NORVASC) 5 MG tablet take 1 tablet by mouth once daily  . aspirin 81 MG tablet Take 81 mg  by mouth daily.  . B Complex Vitamins (VITAMIN B COMPLEX PO) Take 1 tablet by mouth every other day.  . calcium citrate-vitamin D (CITRACAL+D) 315-200 MG-UNIT per tablet Take 1 tablet by mouth 2 (two) times daily.   . Cholecalciferol (VITAMIN D-3) 1000 UNITS CAPS Take 2 capsules by mouth 2 (two) times daily.  . fish oil-omega-3 fatty acids 1000 MG capsule Take 2 g by mouth daily.   . Glucos-Chond-Sterol-Fish Oil (GLUCOSAMINE CHONDROITIN PLUS PO) Take 2 tablets by mouth daily.  Marland Kitchen losartan (COZAAR) 100 MG tablet Take 1 tablet (100 mg total) by mouth daily.  . Magnesium 200 MG TABS Take 1 tablet by mouth daily.   . Melatonin 10 MG CAPS Take 1 capsule by mouth as needed.  . Multiple Vitamins-Minerals (PRESERVISION AREDS  2) CAPS Take 1 tablet by mouth 2 (two) times daily.  Marland Kitchen PROAIR HFA 108 (90 Base) MCG/ACT inhaler inhale 2 puffs by mouth every 6 hours if needed  . vitamin C (ASCORBIC ACID) 500 MG tablet Take 500 mg by mouth daily.  . [DISCONTINUED] losartan (COZAAR) 50 MG tablet Take 1 tablet (50 mg total) by mouth daily.  . [DISCONTINUED] Tiotropium Bromide Monohydrate (SPIRIVA RESPIMAT) 1.25 MCG/ACT AERS Inhale 2 puffs into the lungs daily.  Marland Kitchen umeclidinium-vilanterol (ANORO ELLIPTA) 62.5-25 MCG/INH AEPB Inhale 1 puff into the lungs daily.   No facility-administered encounter medications on file as of 07/13/2016.     Activities of Daily Living No flowsheet data found.  Patient Care Team: [name], MD as PCP - General (Family Medicine) Amy Curley Spice, OD (Optometry) Crista Luria, MD as Attending Physician (Dermatology) Melida Quitter, MD as Attending Physician (Otolaryngology) Toribio Harbour, DC (Chiropractic Medicine)    Assessment:    Medicare Wellness Exam   Exercise Activities and Dietary recommendations    Goals    None     Fall Risk Fall Risk  07/13/2016 10/05/2015 07/01/2014 06/24/2013 01/23/2013  Falls in the past year? No No No No No   Depression Screen PHQ 2/9 Scores 07/13/2016 10/05/2015 07/01/2014 06/24/2013  PHQ - 2 Score 1 0 1 0  PHQ- 9 Score 6 - 3 -     Cognitive Function     6CIT Screen 07/13/2016  What Year? 0 points  What month? 0 points  What time? 0 points  Count back from 20 0 points  Months in reverse 0 points  Repeat phrase 2 points  Total Score 2    Immunization History  Administered Date(s) Administered  . Influenza Split 05/07/2012  . Influenza, High Dose Seasonal PF 05/28/2013  . Influenza,inj,Quad PF,36+ Mos 04/23/2014, 05/14/2015  . Influenza-Unspecified 05/09/2016  . Pneumococcal Conjugate-13 07/01/2014  . Pneumococcal Polysaccharide-23 08/01/2005  . Td 08/01/2001  . Tdap 08/01/2005, 10/07/2015   Screening Tests Health Maintenance   Topic Date Due  . ZOSTAVAX  07/07/2020 (Originally 11/03/1994)  . MAMMOGRAM  08/06/2016  . TETANUS/TDAP  10/06/2025  . INFLUENZA VACCINE  Completed  . DEXA SCAN  Completed  . PNA vac Low Risk Adult  Completed      Plan:    During the course of the visit the patient was educated and counseled about the following appropriate screening and preventive services:   Vaccines to include Pneumoccal, Influenza, Hepatitis B, Td, Zostavax, HCV  Cardiovascular Disease  Colorectal cancer screening  Bone density screening  Diabetes screening  She has macular degeneration.   Mammography/PAP  Nutrition counseling   Patient Instructions (the written plan) was given to the patient.  Everlee Quakenbush, MD  07/13/2016

## 2016-07-17 NOTE — Progress Notes (Signed)
All labs are normal. 

## 2016-08-09 DIAGNOSIS — R69 Illness, unspecified: Secondary | ICD-10-CM | POA: Diagnosis not present

## 2016-08-15 ENCOUNTER — Encounter: Payer: Medicare HMO | Admitting: Family Medicine

## 2016-09-14 ENCOUNTER — Encounter: Payer: Self-pay | Admitting: Family Medicine

## 2016-09-14 ENCOUNTER — Ambulatory Visit (INDEPENDENT_AMBULATORY_CARE_PROVIDER_SITE_OTHER): Payer: Medicare HMO | Admitting: Family Medicine

## 2016-09-14 VITALS — BP 138/82 | HR 67 | Ht 66.0 in | Wt 125.0 lb

## 2016-09-14 DIAGNOSIS — J449 Chronic obstructive pulmonary disease, unspecified: Secondary | ICD-10-CM | POA: Diagnosis not present

## 2016-09-14 DIAGNOSIS — I10 Essential (primary) hypertension: Secondary | ICD-10-CM | POA: Diagnosis not present

## 2016-09-14 DIAGNOSIS — F329 Major depressive disorder, single episode, unspecified: Secondary | ICD-10-CM

## 2016-09-14 DIAGNOSIS — R69 Illness, unspecified: Secondary | ICD-10-CM | POA: Diagnosis not present

## 2016-09-14 DIAGNOSIS — F32A Depression, unspecified: Secondary | ICD-10-CM

## 2016-09-14 MED ORDER — BUPROPION HCL ER (XL) 150 MG PO TB24
150.0000 mg | ORAL_TABLET | ORAL | 2 refills | Status: DC
Start: 2016-09-14 — End: 2016-11-16

## 2016-09-14 MED ORDER — AMLODIPINE BESYLATE 5 MG PO TABS: 5.0000 mg | ORAL_TABLET | Freq: Every day | ORAL | 1 refills | Status: DC

## 2016-09-14 NOTE — Progress Notes (Signed)
Subjective:    CC: COPD, BP  HPI:  COPD - No recent flares or exacerbations. She is currently on Anoro and doing well.  Hypertension- Pt denies chest pain, SOB, dizziness, or heart palpitations.  Taking meds as directed w/o problems.  Denies medication side effects.  He also brought in a home blood pressure log. At least 60-70% of the blood pressures are elevated in the 140s although it to [id number] systolic. He is currently on amlodipine 5 mg, losartan 100 mg.  Has felt down depressed over the last couple of months.  She is just a little stressed and overwhelmed at times. She says things that normally wouldn't bother her too deeply are really bothering her. She said that she's been dealing with macular degeneration. She says it's been depressing her some because it's been making it more hard to try and says she's become almost fearful of trying driving. She also had a bridge fall out and says she's can have to have 2 implants and another tooth removed. She's been a little overwhelmed and stressed by that. She feels like her husband is very supportive and she can openly talk to him about her concerns.  BP 138/82 (BP Location: Right Arm, Cuff Size: Normal)   Pulse 67   Ht 5\' 6"  (1.676 m)   Wt 125 lb (56.7 kg)   SpO2 100%   BMI 20.18 kg/m     Allergies  Allergen Reactions  . Erythromycin Swelling  . Penicillins Swelling and Rash    Past Medical History:  Diagnosis Date  . Asthma, exercise induced   . Difficulty hearing   . History of parathyroid disease   . HTN (hypertension)   . Macular degeneration   . Migraine headache without aura   . OP (osteoporosis)   . Problems related to lack of adequate sleep     Past Surgical History:  Procedure Laterality Date  . ABDOMINAL HYSTERECTOMY     she thinks ovaries removed as well.    Marland Kitchen Anterior acromionectomy  07/31/00   left  . BREAST LUMPECTOMY  1990  . CATARACT EXTRACTION  [id number]  . PARATHYROIDECTOMY  09/06/04   Minimally invasive  parathyroid surgery by Dr. Charise Killian in [place], [place].  . repair of torn rotator cuff  07/31/00   left, and 2007  . TONSILLECTOMY      Social History   Social History  . Marital status: Married    Spouse name: [name]  . Number of children: 2  . Years of education: N/A   Occupational History  . School Teacher Retired    retired   Social History Main Topics  . Smoking status: Never Smoker  . Smokeless tobacco: Never Used  . Alcohol use No  . Drug use: No  . Sexual activity: Yes    Partners: Male   Other Topics Concern  . Not on file   Social History Narrative   At 4 years of college. No regular exercise. No significant caffeine intake.    Family History  Problem Relation Age of Onset  . Heart disease Father     Outpatient Encounter Prescriptions as of 09/14/2016  Medication Sig  . albuterol (VENTOLIN HFA) 108 (90 BASE) MCG/ACT inhaler Inhale 2 puffs into the lungs every 6 (six) hours as needed.  . ALPRAZolam (XANAX) 0.25 MG tablet Take 0.5-2 tablets (0.125-0.5 mg total) by mouth at bedtime as needed for sleep.  . AMBULATORY NON FORMULARY MEDICATION Medication Name: Tdap IM x 1  . amLODipine (  NORVASC) 5 MG tablet Take 1 tablet (5 mg total) by mouth daily.  Marland Kitchen aspirin 81 MG tablet Take 81 mg by mouth daily.  . B Complex Vitamins (VITAMIN B COMPLEX PO) Take 1 tablet by mouth every other day.  . calcium citrate-vitamin D (CITRACAL+D) 315-200 MG-UNIT per tablet Take 1 tablet by mouth 2 (two) times daily.   . Cholecalciferol (VITAMIN D-3) 1000 UNITS CAPS Take 2 capsules by mouth 2 (two) times daily.  . fish oil-omega-3 fatty acids 1000 MG capsule Take 2 g by mouth daily.   . Glucos-Chond-Sterol-Fish Oil (GLUCOSAMINE CHONDROITIN PLUS PO) Take 2 tablets by mouth daily.  Marland Kitchen losartan (COZAAR) 100 MG tablet Take 1 tablet (100 mg total) by mouth daily.  . Magnesium 200 MG TABS Take 1 tablet by mouth daily.   . Melatonin 10 MG CAPS Take 1 capsule by mouth as needed.  . Multiple  Vitamins-Minerals (PRESERVISION AREDS 2) CAPS Take 1 tablet by mouth 2 (two) times daily.  Marland Kitchen PROAIR HFA 108 (90 Base) MCG/ACT inhaler inhale 2 puffs by mouth every 6 hours if needed  . umeclidinium-vilanterol (ANORO ELLIPTA) 62.5-25 MCG/INH AEPB Inhale 1 puff into the lungs daily.  . vitamin C (ASCORBIC ACID) 500 MG tablet Take 500 mg by mouth daily.  . [DISCONTINUED] amLODipine (NORVASC) 5 MG tablet take 1 tablet by mouth once daily  . buPROPion (WELLBUTRIN XL) 150 MG 24 hr tablet Take 1 tablet (150 mg total) by mouth every morning.   No facility-administered encounter medications on file as of 09/14/2016.        Review of Systems: No fevers, chills, night sweats, weight loss, chest pain, or shortness of breath.   Objective:    General: Well Developed, well nourished, and in no acute distress.  Neuro: Alert and oriented x3, extra-ocular muscles intact, sensation grossly intact.  HEENT: Normocephalic, atraumatic  Skin: Warm and dry, no rashes. Cardiac: Regular rate and rhythm, no murmurs rubs or gallops, no lower extremity edema.  Respiratory: Clear to auscultation bilaterally. Not using accessory muscles, speaking in full sentences.   Impression and Recommendations:    COPD- Stable. No recent exacerbations.   HTN - Blood pressure on repeat here today is at goal essentially since she is 83 years old. I am concerned that she has some high pressures at home but she also has some fantastic pressures and even a couple of low ones. I do wonder if some of this is stress or anxiety induced. We did discuss actually treating her mood and seeing if this makes a difference over the next couple of months. If not then we will need to adjust her regimen again.  Acute depressive disorder-discussed options. PHQ 9 score of 8. She does report little pleasure in interest in doing things several days a week and feeling down and depressed more than half the days. She also complains of low energy difficulty  concentrating and feeling bad about herself. She denies any thoughts of wanting to harm herself. And she rates her symptoms is somewhat difficult. Will start Wellbutrin. Follow-up in 3 weeks.

## 2016-10-05 ENCOUNTER — Encounter: Payer: Self-pay | Admitting: Family Medicine

## 2016-10-05 ENCOUNTER — Ambulatory Visit (INDEPENDENT_AMBULATORY_CARE_PROVIDER_SITE_OTHER): Payer: Medicare HMO | Admitting: Family Medicine

## 2016-10-05 VITALS — BP 136/78 | HR 66 | Ht 66.0 in | Wt 125.0 lb

## 2016-10-05 DIAGNOSIS — I10 Essential (primary) hypertension: Secondary | ICD-10-CM

## 2016-10-05 DIAGNOSIS — F329 Major depressive disorder, single episode, unspecified: Secondary | ICD-10-CM

## 2016-10-05 DIAGNOSIS — R69 Illness, unspecified: Secondary | ICD-10-CM | POA: Diagnosis not present

## 2016-10-05 DIAGNOSIS — F32A Depression, unspecified: Secondary | ICD-10-CM

## 2016-10-05 NOTE — Progress Notes (Signed)
Subjective:    CC: Mood  HPI:  F/U acute depression -She's here to follow-up for new diagnosis of acute depression. She was seen about 3 weeks ago and we decided to start her on Wellbutrin. Safar she's been tolerating it well. She was also recently on antibiotics for dental infection and had 2 implants. She is actually been off the antibodies for about 4 days now. She did have 2 separate episodes of feeling lightheaded. The last was on Sunday while she was at church.  For several minutes but she felt bad enough that she went home early. When she got home her blood pressure was 167/77. But by later in the day she felt better and her blood pressure was back to normal. She also had 2 nights where she couldn't sleep well but she said she didn't feel overly tired those days and then the third nights she actually slept great.  Hypertension- Pt denies chest pain, SOB, dizziness, or heart palpitations.  Taking meds as directed w/o problems.  Denies medication side effects.    He did bring in a home blood pressure log today. About half of her blood pressures look fantastic running in the 120s to 130s. The other half are elevated in the 140-150 range. She had to pressures in the 160 range. .   Objective:    General: Well Developed, well nourished, and in no acute distress.  Neuro: Alert and oriented x3, extra-ocular muscles intact, sensation grossly intact.  HEENT: Normocephalic, atraumatic  Skin: Warm and dry, no rashes. Cardiac: Regular rate and rhythm, no murmurs rubs or gallops, no lower extremity edema.  Respiratory: Clear to auscultation bilaterally. Not using accessory muscles, speaking in full sentences.   Impression and Recommendations:    Depression - Last PHQ 9 score was 8. GU current regimen. PHQ 9 score is down to 3 which is fantastic progress. Continue Wellbutrin. Follow up in 6 weeks. I do think she probably has seasonal affective disorder so she is doing well into the spring maybe  averages continue her medication for the summer and then restart her again in October.  Hypertension-overall her blood pressures look good. And a few high ones that she has on her home log when she rechecked them a little later in the day they came back down into the normal range. I think this is not unusual for an 83 year old and we will continue to monitor carefully.

## 2016-11-10 DIAGNOSIS — H52223 Regular astigmatism, bilateral: Secondary | ICD-10-CM | POA: Diagnosis not present

## 2016-11-10 DIAGNOSIS — H3554 Dystrophies primarily involving the retinal pigment epithelium: Secondary | ICD-10-CM | POA: Diagnosis not present

## 2016-11-10 DIAGNOSIS — Z961 Presence of intraocular lens: Secondary | ICD-10-CM | POA: Diagnosis not present

## 2016-11-10 DIAGNOSIS — H524 Presbyopia: Secondary | ICD-10-CM | POA: Diagnosis not present

## 2016-11-10 DIAGNOSIS — H1870 Unspecified corneal deformity: Secondary | ICD-10-CM | POA: Diagnosis not present

## 2016-11-10 DIAGNOSIS — H5212 Myopia, left eye: Secondary | ICD-10-CM | POA: Diagnosis not present

## 2016-11-10 DIAGNOSIS — H5201 Hypermetropia, right eye: Secondary | ICD-10-CM | POA: Diagnosis not present

## 2016-11-10 DIAGNOSIS — H35031 Hypertensive retinopathy, right eye: Secondary | ICD-10-CM | POA: Diagnosis not present

## 2016-11-10 DIAGNOSIS — I10 Essential (primary) hypertension: Secondary | ICD-10-CM | POA: Diagnosis not present

## 2016-11-10 DIAGNOSIS — D2311 Other benign neoplasm of skin of right eyelid, including canthus: Secondary | ICD-10-CM | POA: Diagnosis not present

## 2016-11-16 ENCOUNTER — Encounter: Payer: Self-pay | Admitting: Family Medicine

## 2016-11-16 ENCOUNTER — Ambulatory Visit (INDEPENDENT_AMBULATORY_CARE_PROVIDER_SITE_OTHER): Payer: Medicare HMO | Admitting: Family Medicine

## 2016-11-16 VITALS — BP 140/72 | HR 70 | Ht 66.0 in | Wt 128.0 lb

## 2016-11-16 DIAGNOSIS — R69 Illness, unspecified: Secondary | ICD-10-CM | POA: Diagnosis not present

## 2016-11-16 DIAGNOSIS — F329 Major depressive disorder, single episode, unspecified: Secondary | ICD-10-CM

## 2016-11-16 DIAGNOSIS — F32A Depression, unspecified: Secondary | ICD-10-CM

## 2016-11-16 DIAGNOSIS — I10 Essential (primary) hypertension: Secondary | ICD-10-CM

## 2016-11-16 MED ORDER — BUPROPION HCL ER (XL) 150 MG PO TB24: 150.0000 mg | ORAL_TABLET | ORAL | 1 refills | Status: DC

## 2016-11-16 NOTE — Progress Notes (Signed)
Subjective:    CC: Depression   HPI: Follow-up depression-this is a 6 week follow-up. We started her on Wellbutrin at previous visit and she was doing fairly well at that time.  Hypertension- Pt denies chest pain, SOB, dizziness, or heart palpitations.  Taking meds as directed w/o problems.  Denies medication side effects.  Brought in home blood pressure logs. She did have a few that were elevated but overall they look fantastic. She feels like her blood pressure overall has been much improved.   Past medical history, Surgical history, Family history not pertinant except as noted below, Social history, Allergies, and medications have been entered into the medical record, reviewed, and corrections made.   Review of Systems: No fevers, chills, night sweats, weight loss, chest pain, or shortness of breath.   Objective:    General: Well Developed, well nourished, and in no acute distress.  Neuro: Alert and oriented x3, extra-ocular muscles intact, sensation grossly intact.  HEENT: Normocephalic, atraumatic  Skin: Warm and dry, no rashes. Cardiac: Regular rate and rhythm, no murmurs rubs or gallops, no lower extremity edema.  Respiratory: Clear to auscultation bilaterally. Not using accessory muscles, speaking in full sentences.   Impression and Recommendations:    Depression - PHQ 9 score of 3 today which is fantastic. Doing well on her Wellbutrin. Into new current regimen. Refill for 90 day supply. I like to see her back in about 45 months in the fall and will continue to keep her annual physical in December.  HTN - Well controlled. Continue current regimen. Follow up in

## 2016-12-23 DIAGNOSIS — D225 Melanocytic nevi of trunk: Secondary | ICD-10-CM | POA: Diagnosis not present

## 2016-12-23 DIAGNOSIS — Z86018 Personal history of other benign neoplasm: Secondary | ICD-10-CM | POA: Diagnosis not present

## 2016-12-23 DIAGNOSIS — Z85828 Personal history of other malignant neoplasm of skin: Secondary | ICD-10-CM | POA: Diagnosis not present

## 2016-12-23 DIAGNOSIS — L700 Acne vulgaris: Secondary | ICD-10-CM | POA: Diagnosis not present

## 2016-12-23 DIAGNOSIS — R238 Other skin changes: Secondary | ICD-10-CM | POA: Diagnosis not present

## 2016-12-23 DIAGNOSIS — L821 Other seborrheic keratosis: Secondary | ICD-10-CM | POA: Diagnosis not present

## 2016-12-29 ENCOUNTER — Other Ambulatory Visit: Payer: Self-pay | Admitting: *Deleted

## 2016-12-29 MED ORDER — AMLODIPINE BESYLATE 5 MG PO TABS: 5.0000 mg | ORAL_TABLET | Freq: Every day | ORAL | 1 refills | Status: DC

## 2016-12-29 MED ORDER — LOSARTAN POTASSIUM 100 MG PO TABS: 100.0000 mg | ORAL_TABLET | Freq: Every day | ORAL | 1 refills | Status: DC

## 2017-03-20 ENCOUNTER — Ambulatory Visit (INDEPENDENT_AMBULATORY_CARE_PROVIDER_SITE_OTHER): Payer: Medicare HMO | Admitting: Family Medicine

## 2017-03-20 ENCOUNTER — Encounter: Payer: Self-pay | Admitting: Family Medicine

## 2017-03-20 ENCOUNTER — Ambulatory Visit (INDEPENDENT_AMBULATORY_CARE_PROVIDER_SITE_OTHER): Payer: Medicare HMO

## 2017-03-20 VITALS — BP 148/82 | HR 67 | Wt 124.0 lb

## 2017-03-20 DIAGNOSIS — M5136 Other intervertebral disc degeneration, lumbar region: Secondary | ICD-10-CM

## 2017-03-20 DIAGNOSIS — M545 Low back pain, unspecified: Secondary | ICD-10-CM

## 2017-03-20 DIAGNOSIS — I700 Atherosclerosis of aorta: Secondary | ICD-10-CM

## 2017-03-20 DIAGNOSIS — F329 Major depressive disorder, single episode, unspecified: Secondary | ICD-10-CM | POA: Diagnosis not present

## 2017-03-20 DIAGNOSIS — J449 Chronic obstructive pulmonary disease, unspecified: Secondary | ICD-10-CM

## 2017-03-20 DIAGNOSIS — I10 Essential (primary) hypertension: Secondary | ICD-10-CM

## 2017-03-20 DIAGNOSIS — R69 Illness, unspecified: Secondary | ICD-10-CM | POA: Diagnosis not present

## 2017-03-20 DIAGNOSIS — M79605 Pain in left leg: Secondary | ICD-10-CM

## 2017-03-20 DIAGNOSIS — F32A Depression, unspecified: Secondary | ICD-10-CM

## 2017-03-20 MED ORDER — PREDNISONE 20 MG PO TABS: 40.0000 mg | ORAL_TABLET | Freq: Every day | ORAL | 0 refills | Status: DC

## 2017-03-20 NOTE — Progress Notes (Signed)
Subjective:    CC: Mood, COPD  HPI:  F/U COPD- currently on Anoro. No recent flares or exacerbations.  F/U Depression currently on Wellbutrin.  She is happy with this medication really doesn't want to can discontinue it. She feels like it helps keep her more levelheaded when stressful situations occur. Sitting any side effects currently  Hypertension- Pt denies chest pain, SOB, dizziness, or heart palpitations.  Taking meds as directed w/o problems.  Denies medication side effects.  She did bring in a home log of blood pressures and overall they look fantastic. There is diffuse elevated pressures in the 140s and 150s but most of them are in the 120s over 70s.  She also complains of left low back pain. It started in early July. She's not sure what may have triggered it. She does work in her garden regularly [place] she's not sure if maybe she just overdid it. She had an x-ray from May 2015 when she was having some low back pain which showed some degenerative disc disease and facet arthropathy. She says it's particular painful on the left low back. It's worse with sitting for long period time actually feels a little bit better when she stands up and moves around. No fall or injury. She says she's taken ibuprofen a couple times but should not taken anything consistently. She says it radiates towards the left outer hip and she's having pain over the greater trochanteric area as well.  Past medical history, Surgical history, Family history not pertinant except as noted below, Social history, Allergies, and medications have been entered into the medical record, reviewed, and corrections made.   Review of Systems: No fevers, chills, night sweats, weight loss, chest pain, or shortness of breath.   Objective:    General: Well Developed, well nourished, and in no acute distress.  Neuro: Alert and oriented x3, extra-ocular muscles intact, sensation grossly intact.  HEENT: Normocephalic, atraumatic  Skin:  Warm and dry, no rashes. Cardiac: Regular rate and rhythm, no murmurs rubs or gallops, no lower extremity edema.  Respiratory: Clear to auscultation bilaterally. Not using accessory muscles, speaking in full sentences. MSK: Nontender over the lumbar spine. Mildly tender over the SI joint just lateral to that. She is definitely very tender over the left trochanteric bursa. Hip, knee, ankle strength is 5 out of 5 bilaterally. Patellar reflexes 1+ bilaterally. Negative straight leg raise bilaterally.   Impression and Recommendations:    COPD - Stable. No recent exacerbations. She wants to get her flu shot done at the pharmacy with her husband and so declined today.  Depression - Well controlled. PHQ 9 score is 0 today but she does not want to discontinue her medication. She feels like it helps keep her from feeling overly anxious when stressful things happen and would like to continue it. We can address it again in one year.  Hypertension- Well controlled. Continue current regimen. Follow up in  6 months. Due for BMP.  Left low back pain-we'll go ahead and get x-ray today since her pain has been persistent for greater than a month and make sure that there are no new or significant findings from 3 years ago. Discussed options including formal physical therapy versus home therapy and possibly a course of prednisone.   Left trochanteric bursitis-given home stretches and exercises to do on her own. Again also discussed the possibility of formal physical therapy but she wants to start with home therapy first.

## 2017-03-21 LAB — BASIC METABOLIC PANEL WITH GFR
BUN: 17 mg/dL (ref 7–25)
CALCIUM: 9.7 mg/dL (ref 8.6–10.4)
CO2: 21 mmol/L (ref 20–32)
CREATININE: 0.89 mg/dL — AB (ref 0.60–0.88)
Chloride: 107 mmol/L (ref 98–110)
GFR, EST AFRICAN AMERICAN: 70 mL/min (ref >=60)
GFR, Est Non African American: 61 mL/min (ref >=60)
Glucose, Bld: 88 mg/dL (ref 65–99)
Potassium: 4.2 mmol/L (ref 3.5–5.3)
SODIUM: 144 mmol/L (ref 135–146)

## 2017-03-21 NOTE — Progress Notes (Signed)
All labs are normal. 

## 2017-04-18 ENCOUNTER — Telehealth: Payer: Self-pay | Admitting: Family Medicine

## 2017-04-18 MED ORDER — ALPRAZOLAM 0.25 MG PO TABS: 0.1250 mg | ORAL_TABLET | Freq: Every evening | ORAL | 0 refills | Status: DC | PRN

## 2017-04-18 MED ORDER — ALBUTEROL SULFATE HFA 108 (90 BASE) MCG/ACT IN AERS: INHALATION_SPRAY | RESPIRATORY_TRACT | 1 refills | Status: DC

## 2017-04-18 NOTE — Telephone Encounter (Signed)
Patient called advised that she uses a new pharmacy and needs that medication sent to them..ProAir (Inhaler) 90mg / Alprazolam 0.25 needs that now and they wouldn't fill it today because it was not forward [facility] since they closed.The other prescriptions have been sent to [facility] but pharmacy advised need a new prescription sent to them before can fill. Thanks

## 2017-05-02 DIAGNOSIS — R69 Illness, unspecified: Secondary | ICD-10-CM | POA: Diagnosis not present

## 2017-05-09 DIAGNOSIS — R69 Illness, unspecified: Secondary | ICD-10-CM | POA: Diagnosis not present

## 2017-05-28 ENCOUNTER — Other Ambulatory Visit: Payer: Self-pay | Admitting: Family Medicine

## 2017-07-05 ENCOUNTER — Other Ambulatory Visit: Payer: Self-pay | Admitting: Family Medicine

## 2017-07-17 ENCOUNTER — Ambulatory Visit (INDEPENDENT_AMBULATORY_CARE_PROVIDER_SITE_OTHER): Payer: Medicare HMO | Admitting: Family Medicine

## 2017-07-17 ENCOUNTER — Encounter: Payer: Self-pay | Admitting: Family Medicine

## 2017-07-17 ENCOUNTER — Other Ambulatory Visit: Payer: Self-pay | Admitting: Family Medicine

## 2017-07-17 VITALS — BP 153/73 | HR 65 | Ht 63.5 in | Wt 125.0 lb

## 2017-07-17 DIAGNOSIS — I10 Essential (primary) hypertension: Secondary | ICD-10-CM

## 2017-07-17 DIAGNOSIS — M1811 Unilateral primary osteoarthritis of first carpometacarpal joint, right hand: Secondary | ICD-10-CM | POA: Diagnosis not present

## 2017-07-17 DIAGNOSIS — Z0000 Encounter for general adult medical examination without abnormal findings: Secondary | ICD-10-CM | POA: Diagnosis not present

## 2017-07-17 DIAGNOSIS — K146 Glossodynia: Secondary | ICD-10-CM

## 2017-07-17 DIAGNOSIS — J449 Chronic obstructive pulmonary disease, unspecified: Secondary | ICD-10-CM

## 2017-07-17 LAB — COMPLETE METABOLIC PANEL WITH GFR
AG Ratio: 2 (calc) (ref 1.0–2.5)
ALT: 20 U/L (ref 6–29)
AST: 20 U/L (ref 10–35)
Albumin: 4.2 g/dL (ref 3.6–5.1)
Alkaline phosphatase (APISO): 84 U/L (ref 33–130)
BUN: 19 mg/dL (ref 7–25)
CALCIUM: 9.9 mg/dL (ref 8.6–10.4)
CO2: 29 mmol/L (ref 20–32)
Chloride: 105 mmol/L (ref 98–110)
Creat: 0.86 mg/dL (ref 0.60–0.88)
GFR, EST AFRICAN AMERICAN: 73 mL/min/{1.73_m2} (ref >=60)
GFR, EST NON AFRICAN AMERICAN: 63 mL/min/{1.73_m2} (ref >=60)
GLUCOSE: 89 mg/dL (ref 65–99)
Globulin: 2.1 g/dL (calc) (ref 1.9–3.7)
Potassium: 4 mmol/L (ref 3.5–5.3)
Sodium: 141 mmol/L (ref 135–146)
TOTAL PROTEIN: 6.3 g/dL (ref 6.1–8.1)
Total Bilirubin: 0.8 mg/dL (ref 0.2–1.2)

## 2017-07-17 LAB — LIPID PANEL
Cholesterol: 220 mg/dL — ABNORMAL HIGH (ref <200)
HDL: 72 mg/dL (ref >50)
LDL CHOLESTEROL (CALC): 131 mg/dL — AB
NON-HDL CHOLESTEROL (CALC): 148 mg/dL — AB (ref <130)
TRIGLYCERIDES: 77 mg/dL (ref <150)
Total CHOL/HDL Ratio: 3.1 (calc) (ref <5.0)

## 2017-07-17 NOTE — Progress Notes (Addendum)
Subjective:   [name] is a 83 y.o. female who presents for Medicare Annual (Subsequent) preventive examination.  Review of Systems:  Comprehensive ROS Cardiac Risk Factors include: none     Objective:     Vitals: BP (!) 153/73 (BP Location: Left Arm, Cuff Size: Normal)   Pulse 65   Ht 5' 3.5" (1.613 m)   Wt 125 lb (56.7 kg)   SpO2 99%   BMI 21.80 kg/m   Body mass index is 21.8 kg/m.  Advanced Directives 07/17/2017 07/01/2014 01/08/2014  Does Patient Have a Medical Advance Directive? Yes Yes Patient has advance directive, copy not in chart  Type of Advance Directive [place];Living will Living will Living will  Copy of [place] in Chart? No - copy requested No - copy requested -    Tobacco Social History   Tobacco Use  Smoking Status Never Smoker  Smokeless Tobacco Never Used     Counseling given: Not Answered   Clinical Intake:  She does have a couple of concerns today.  One is that she noticed that she is not hearing as well out of her right ear and thinks that it may be blocked again.  She had it cleaned out probably 3 or 4 years ago at [facility].  She is also noticed some tenderness over her mouth and tongue particularly at the tip.  She says it seems to come and go and she read that it could be 1 of the side effects of the Wellbutrin but really likes the Wellbutrin and does not want to discontinue it at this time.  She is also been having some pain at the base of the right thumb since the summertime.  It is painful if she lifts heavy things like a pan or if she tries to twist let off of something.  She is right-handed.  She is not currently exercising.    Physical Exam  Constitutional: She is oriented to person, place, and time. She appears well-developed and well-nourished.  HENT:  Head: Normocephalic and atraumatic.  Right Ear: External ear normal.  Left Ear: External ear normal.  Nose: Nose normal.  Mouth/Throat:  Oropharynx is clear and moist.  TMs and canals are clear.   Eyes: Conjunctivae and EOM are normal. Pupils are equal, round, and reactive to light.  Neck: Neck supple. No thyromegaly present.  Cardiovascular: Normal rate, regular rhythm and normal heart sounds.  Pulmonary/Chest: Effort normal and breath sounds normal. She has no wheezes.  Abdominal: Soft. Bowel sounds are normal.  Musculoskeletal:  Left thumb with normal range of motion.  She has a squaring off deformity of the [facility] joint and is just mildly tender over the joint line.  Lymphadenopathy:    She has no cervical adenopathy.  Neurological: She is alert and oriented to person, place, and time.  Skin: Skin is warm and dry.  Psychiatric: She has a normal mood and affect. Her behavior is normal.                      Past Medical History:  Diagnosis Date  . Asthma, exercise induced   . Difficulty hearing   . History of parathyroid disease   . HTN (hypertension)   . Macular degeneration   . Migraine headache without aura   . OP (osteoporosis)   . Problems related to lack of adequate sleep    Past Surgical History:  Procedure Laterality Date  . ABDOMINAL HYSTERECTOMY  she thinks ovaries removed as well.    Marland Kitchen Anterior acromionectomy  07/31/00   left  . BREAST LUMPECTOMY  1990  . CATARACT EXTRACTION  [id number]  . PARATHYROIDECTOMY  09/06/04   Minimally invasive parathyroid surgery by Dr. Charise Killian in [place], [place].  . repair of torn rotator cuff  07/31/00   left, and 2007  . TONSILLECTOMY     Family History  Problem Relation Age of Onset  . Heart disease Father    Social History   Socioeconomic History  . Marital status: Married    Spouse name: [name]  . Number of children: 2  . Years of education: None  . Highest education level: None  Social Needs  . Financial resource strain: None  . Food insecurity - worry: None  . Food insecurity - inability: None  . Transportation needs - medical: None   . Transportation needs - non-medical: None  Occupational History  . Occupation: Games developer: RETIRED    Comment: retired  Tobacco Use  . Smoking status: Never Smoker  . Smokeless tobacco: Never Used  Substance and Sexual Activity  . Alcohol use: No  . Drug use: No  . Sexual activity: Yes    Partners: Male  Other Topics Concern  . None  Social History Narrative   At 4 years of college. No regular exercise. No significant caffeine intake.    Outpatient Encounter Medications as of 07/17/2017  Medication Sig  . albuterol (PROAIR HFA) 108 (90 Base) MCG/ACT inhaler inhale 2 puffs by mouth every 6 hours if needed  . ALPRAZolam (XANAX) 0.25 MG tablet Take 0.5-2 tablets (0.125-0.5 mg total) by mouth at bedtime as needed for sleep.  . AMBULATORY NON FORMULARY MEDICATION Medication Name: Tdap IM x 1  . amLODipine (NORVASC) 5 MG tablet Take 1 tablet (5 mg total) by mouth daily.  Marland Kitchen aspirin 81 MG tablet Take 81 mg by mouth daily.  . B Complex Vitamins (VITAMIN B COMPLEX PO) Take 1 tablet by mouth every other day.  Marland Kitchen buPROPion (WELLBUTRIN XL) 150 MG 24 hr tablet TAKE 1 TABLET EVERY MORNING  . calcium citrate-vitamin D (CITRACAL+D) 315-200 MG-UNIT tablet Take 2 tablets by mouth 2 (two) times daily.  . Cholecalciferol (VITAMIN D3) 1000 units CAPS Take 1 capsule by mouth daily.  Marland Kitchen losartan (COZAAR) 100 MG tablet TAKE 1 TABLET BY MOUTH EVERY DAY  . Magnesium 200 MG TABS Take 1 tablet by mouth daily.   . Melatonin 10 MG CAPS Take 1 capsule by mouth as needed.  . Multiple Vitamins-Minerals (PRESERVISION AREDS 2) CAPS Take 1 tablet by mouth 2 (two) times daily.  Marland Kitchen umeclidinium-vilanterol (ANORO ELLIPTA) 62.5-25 MCG/INH AEPB Inhale 1 puff into the lungs daily.  . vitamin C (ASCORBIC ACID) 500 MG tablet Take 500 mg by mouth daily.  . [DISCONTINUED] predniSONE (DELTASONE) 20 MG tablet Take 2 tablets (40 mg total) by mouth daily.   No facility-administered encounter medications on file  as of 07/17/2017.     Activities of Daily Living In your present state of health, do you have any difficulty performing the following activities: 07/17/2017  Hearing? N  Vision? N  Comment weraing Rx glasses  Difficulty concentrating or making decisions? N  Walking or climbing stairs? N  Dressing or bathing? N  Doing errands, shopping? N  Preparing Food and eating ? N  Using the Toilet? N  In the past six months, have you accidently leaked urine? N  Do you have problems  with loss of bowel control? N  Managing your Medications? N  Managing your Finances? N  Housekeeping or managing your Housekeeping? N  Some recent data might be hidden    Patient Care Team: [name], MD as PCP - General (Family Medicine) Renelda Loma, [place] (Optometry) Crista Luria, MD as Attending Physician (Dermatology) Melida Quitter, MD as Attending Physician (Otolaryngology) Toribio Harbour, [place] (Chiropractic Medicine)    Assessment:   This is a routine wellness examination for Avacyn.  Exercise Activities and Dietary recommendations Current Exercise Habits: The patient does not participate in regular exercise at present, Type of exercise: strength training/weights;treadmill, Frequency (Times/Week): 3, Intensity: Moderate, Exercise limited by: None identified  Goals    None      Fall Risk Fall Risk  07/17/2017 07/13/2016 10/05/2015 07/01/2014 06/24/2013  Falls in the past year? No No No No No    Depression Screen PHQ 2/9 Scores 07/17/2017 03/20/2017 11/16/2016 09/14/2016  PHQ - 2 Score 2 0 0 3  PHQ- 9 Score 7 0 3 8     Cognitive Function     6CIT Screen 07/17/2017 07/13/2016  What Year? 0 points 0 points  What month? 0 points 0 points  What time? 0 points 0 points  Count back from 20 0 points 0 points  Months in reverse 0 points 0 points  Repeat phrase 0 points 2 points  Total Score 0 2    Immunization History  Administered Date(s) Administered  . Influenza Split 05/07/2012  .  Influenza, High Dose Seasonal PF 05/28/2013, 05/02/2017  . Influenza,inj,Quad PF,6+ Mos 04/23/2014, 05/14/2015  . Influenza-Unspecified 05/09/2016  . Pneumococcal Conjugate-13 07/01/2014  . Pneumococcal Polysaccharide-23 08/01/2005  . Td 08/01/2001  . Tdap 08/01/2005, 10/07/2015    Qualifies for Shingles Vaccine? Yes, information given.    Screening Tests Health Maintenance  Topic Date Due  . TETANUS/TDAP  10/06/2025  . INFLUENZA VACCINE  Completed  . DEXA SCAN  Completed  . PNA vac Low Risk Adult  Completed    Cancer Screenings: Lung: Low Dose CT Chest recommended if Age 65-80 years, 30 pack-year currently smoking OR have quit w/in 15years. Patient does not qualify. Breast:  Up to date on Mammogram? Yes   Up to date of Bone Density/Dexa? Yes Colorectal: Up to date, done 2014.        Plan:   Medicare Wellness Exam   I have personally reviewed and noted the following in the patient's chart:   . Medical and social history . Use of alcohol, tobacco or illicit drugs  . Current medications and supplements -updated. . Functional ability and status . Nutritional status - good.  Marland Kitchen Physical activity -regular exercise-did encourage her to try at least walk for 15-20 minutes 3 days a week. . Advanced directives . List of other physicians - up to date.  Marland Kitchen Hospitalizations, surgeries, and ER visits in previous 12 months . Vitals . Screenings to include cognitive, depression, and falls -negative screenings. . Referrals and appointments . No longer doing mammo based on age.   . OA of right thumb base. -She actually did physical therapy for her hand at one point.  Encouraged her to pull out those exercises and start doing them again.  If she does not notice significant improvement over the next 2-3 weeks and would consider referral to PT again for ultrasound therapy which she found extremely helpful the first time around.  Inset of topical NSAID gel as well. Marland Kitchen HTN-Home blood pressures  overall  look good.  She will he has always has mildly elevated pressure here in the office. . Sore tongue -check B12 and B1 just to rule out deficiencies that could be causing her symptoms though it may also be the bupropion.   In addition, I have reviewed and discussed with patient certain preventive protocols, quality metrics, and best practice recommendations. A written personalized care plan for preventive services as well as general preventive health recommendations were provided to patient.     Beatrice Lecher, MD  07/17/2017

## 2017-07-17 NOTE — Patient Instructions (Addendum)
Can try the debrox drops for you years.  Apply 4-5 drops twice a day for 4 days. Then break for 3 days then repeat again.   Health Maintenance, Female Adopting a healthy lifestyle and getting preventive care can go a long way to promote health and wellness. Talk with your health care provider about what schedule of regular examinations is right for you. This is a good chance for you to check in with your provider about disease prevention and staying healthy. In between checkups, there are plenty of things you can do on your own. Experts have done a lot of research about which lifestyle changes and preventive measures are most likely to keep you healthy. Ask your health care provider for more information. Weight and diet Eat a healthy diet  Be sure to include plenty of vegetables, fruits, low-fat dairy products, and lean protein.  Do not eat a lot of foods high in solid fats, added sugars, or salt.  Get regular exercise. This is one of the most important things you can do for your health. ? Most adults should exercise for at least 150 minutes each week. The exercise should increase your heart rate and make you sweat (moderate-intensity exercise). ? Most adults should also do strengthening exercises at least twice a week. This is in addition to the moderate-intensity exercise.  Maintain a healthy weight  Body mass index (BMI) is a measurement that can be used to identify possible weight problems. It estimates body fat based on height and weight. Your health care provider can help determine your BMI and help you achieve or maintain a healthy weight.  For females 60 years of age and older: ? A BMI below 18.5 is considered underweight. ? A BMI of 18.5 to 24.9 is normal. ? A BMI of 25 to 29.9 is considered overweight. ? A BMI of 30 and above is considered obese.  Watch levels of cholesterol and blood lipids  You should start having your blood tested for lipids and cholesterol at 81 years of  age, then have this test every 5 years.  You may need to have your cholesterol levels checked more often if: ? Your lipid or cholesterol levels are high. ? You are older than 81 years of age. ? You are at high risk for heart disease.  Cancer screening Lung Cancer  Lung cancer screening is recommended for adults 77-57 years old who are at high risk for lung cancer because of a history of smoking.  A yearly low-dose CT scan of the lungs is recommended for people who: ? Currently smoke. ? Have quit within the past 15 years. ? Have at least a 30-pack-year history of smoking. A pack year is smoking an average of one pack of cigarettes a day for 1 year.  Yearly screening should continue until it has been 15 years since you quit.  Yearly screening should stop if you develop a health problem that would prevent you from having lung cancer treatment.  Breast Cancer  Practice breast self-awareness. This means understanding how your breasts normally appear and feel.  It also means doing regular breast self-exams. Let your health care provider know about any changes, no matter how small.  If you are in your 20s or 30s, you should have a clinical breast exam (CBE) by a health care provider every 1-3 years as part of a regular health exam.  If you are 38 or older, have a CBE every year. Also consider having a breast X-ray (  mammogram) every year.  If you have a family history of breast cancer, talk to your health care provider about genetic screening.  If you are at high risk for breast cancer, talk to your health care provider about having an MRI and a mammogram every year.  Breast cancer gene (BRCA) assessment is recommended for women who have family members with BRCA-related cancers. BRCA-related cancers include: ? Breast. ? Ovarian. ? Tubal. ? Peritoneal cancers.  Results of the assessment will determine the need for genetic counseling and BRCA1 and BRCA2 testing.  Cervical  Cancer Your health care provider may recommend that you be screened regularly for cancer of the pelvic organs (ovaries, uterus, and vagina). This screening involves a pelvic examination, including checking for microscopic changes to the surface of your cervix (Pap test). You may be encouraged to have this screening done every 3 years, beginning at age 86.  For women ages 45-65, health care providers may recommend pelvic exams and Pap testing every 3 years, or they may recommend the Pap and pelvic exam, combined with testing for human papilloma virus (HPV), every 5 years. Some types of HPV increase your risk of cervical cancer. Testing for HPV may also be done on women of any age with unclear Pap test results.  Other health care providers may not recommend any screening for nonpregnant women who are considered low risk for pelvic cancer and who do not have symptoms. Ask your health care provider if a screening pelvic exam is right for you.  If you have had past treatment for cervical cancer or a condition that could lead to cancer, you need Pap tests and screening for cancer for at least 20 years after your treatment. If Pap tests have been discontinued, your risk factors (such as having a new sexual partner) need to be reassessed to determine if screening should resume. Some women have medical problems that increase the chance of getting cervical cancer. In these cases, your health care provider may recommend more frequent screening and Pap tests.  Colorectal Cancer  This type of cancer can be detected and often prevented.  Routine colorectal cancer screening usually begins at 81 years of age and continues through 81 years of age.  Your health care provider may recommend screening at an earlier age if you have risk factors for colon cancer.  Your health care provider may also recommend using home test kits to check for hidden blood in the stool.  A small camera at the end of a tube can be used to  examine your colon directly (sigmoidoscopy or colonoscopy). This is done to check for the earliest forms of colorectal cancer.  Routine screening usually begins at age 29.  Direct examination of the colon should be repeated every 5-10 years through 81 years of age. However, you may need to be screened more often if early forms of precancerous polyps or small growths are found.  Skin Cancer  Check your skin from head to toe regularly.  Tell your health care provider about any new moles or changes in moles, especially if there is a change in a mole's shape or color.  Also tell your health care provider if you have a mole that is larger than the size of a pencil eraser.  Always use sunscreen. Apply sunscreen liberally and repeatedly throughout the day.  Protect yourself by wearing long sleeves, pants, a wide-brimmed hat, and sunglasses whenever you are outside.  Heart disease, diabetes, and high blood pressure  High blood pressure  causes heart disease and increases the risk of stroke. High blood pressure is more likely to develop in: ? People who have blood pressure in the high end of the normal range (130-139/85-89 mm Hg). ? People who are overweight or obese. ? People who are African American.  If you are 79-44 years of age, have your blood pressure checked every 3-5 years. If you are 46 years of age or older, have your blood pressure checked every year. You should have your blood pressure measured twice-once when you are at a hospital or clinic, and once when you are not at a hospital or clinic. Record the average of the two measurements. To check your blood pressure when you are not at a hospital or clinic, you can use: ? An automated blood pressure machine at a pharmacy. ? A home blood pressure monitor.  If you are between 94 years and 72 years old, ask your health care provider if you should take aspirin to prevent strokes.  Have regular diabetes screenings. This involves taking a  blood sample to check your fasting blood sugar level. ? If you are at a normal weight and have a low risk for diabetes, have this test once every three years after 81 years of age. ? If you are overweight and have a high risk for diabetes, consider being tested at a younger age or more often. Preventing infection Hepatitis B  If you have a higher risk for hepatitis B, you should be screened for this virus. You are considered at high risk for hepatitis B if: ? You were born in a country where hepatitis B is common. Ask your health care provider which countries are considered high risk. ? Your parents were born in a high-risk country, and you have not been immunized against hepatitis B (hepatitis B vaccine). ? You have HIV or AIDS. ? You use needles to inject street drugs. ? You live with someone who has hepatitis B. ? You have had sex with someone who has hepatitis B. ? You get hemodialysis treatment. ? You take certain medicines for conditions, including cancer, organ transplantation, and autoimmune conditions.  Hepatitis C  Blood testing is recommended for: ? Everyone born from [birth year] through 1963. ? Anyone with known risk factors for hepatitis C.  Sexually transmitted infections (STIs)  You should be screened for sexually transmitted infections (STIs) including gonorrhea and chlamydia if: ? You are sexually active and are younger than 81 years of age. ? You are older than 81 years of age and your health care provider tells you that you are at risk for this type of infection. ? Your sexual activity has changed since you were last screened and you are at an increased risk for chlamydia or gonorrhea. Ask your health care provider if you are at risk.  If you do not have HIV, but are at risk, it may be recommended that you take a prescription medicine daily to prevent HIV infection. This is called pre-exposure prophylaxis (PrEP). You are considered at risk if: ? You are sexually active and  do not regularly use condoms or know the HIV status of your partner(s). ? You take drugs by injection. ? You are sexually active with a partner who has HIV.  Talk with your health care provider about whether you are at high risk of being infected with HIV. If you choose to begin PrEP, you should first be tested for HIV. You should then be tested every 3 months for as long  as you are taking PrEP. Pregnancy  If you are premenopausal and you may become pregnant, ask your health care provider about preconception counseling.  If you may become pregnant, take 400 to 800 micrograms (mcg) of folic acid every day.  If you want to prevent pregnancy, talk to your health care provider about birth control (contraception). Osteoporosis and menopause  Osteoporosis is a disease in which the bones lose minerals and strength with aging. This can result in serious bone fractures. Your risk for osteoporosis can be identified using a bone density scan.  If you are 78 years of age or older, or if you are at risk for osteoporosis and fractures, ask your health care provider if you should be screened.  Ask your health care provider whether you should take a calcium or vitamin D supplement to lower your risk for osteoporosis.  Menopause may have certain physical symptoms and risks.  Hormone replacement therapy may reduce some of these symptoms and risks. Talk to your health care provider about whether hormone replacement therapy is right for you. Follow these instructions at home:  Schedule regular health, dental, and eye exams.  Stay current with your immunizations.  Do not use any tobacco products including cigarettes, chewing tobacco, or electronic cigarettes.  If you are pregnant, do not drink alcohol.  If you are breastfeeding, limit how much and how often you drink alcohol.  Limit alcohol intake to no more than 1 drink per day for nonpregnant women. One drink equals 12 ounces of beer, 5 ounces of  wine, or 1 ounces of hard liquor.  Do not use street drugs.  Do not share needles.  Ask your health care provider for help if you need support or information about quitting drugs.  Tell your health care provider if you often feel depressed.  Tell your health care provider if you have ever been abused or do not feel safe at home. This information is not intended to replace advice given to you by your health care provider. Make sure you discuss any questions you have with your health care provider. Document Released: 01/31/2011 Document Revised: 12/24/2015 Document Reviewed: 04/21/2015 Elsevier Interactive Patient Education  [employer].

## 2017-07-21 LAB — VITAMIN B1: Vitamin B1 (Thiamine): 12 nmol/L (ref 8–30)

## 2017-07-21 LAB — VITAMIN B12: VITAMIN B 12: 519 pg/mL (ref 200–1100)

## 2017-07-31 ENCOUNTER — Ambulatory Visit (INDEPENDENT_AMBULATORY_CARE_PROVIDER_SITE_OTHER): Payer: Medicare HMO | Admitting: Family Medicine

## 2017-07-31 VITALS — BP 131/65 | HR 66

## 2017-07-31 DIAGNOSIS — H6123 Impacted cerumen, bilateral: Secondary | ICD-10-CM | POA: Diagnosis not present

## 2017-07-31 NOTE — Progress Notes (Signed)
Pt came into clinic today for bilateral ear irrigation. She has been using OTC debrox drops, which have removed a lot of the wax. Pt tolerated bilateral ear irrigation well, no immediate complications. PCP able to view before Pt left clinic. No further questions/concerns.

## 2017-07-31 NOTE — Progress Notes (Signed)
   Subjective:    Patient ID: [name], female    DOB: 05/01/1933, 83 y.o.   MRN: [id number]  HPI Lateral ear cerumen impaction please see note above.  Blockage was causing hearing impairment and fullness.   Review of Systems     Objective:   Physical Exam  Constitutional: She appears well-developed and well-nourished.  HENT:  Canal is clear.  TM is normal.  Right canal just has a little bit of dead skin around the canal but able to visualize the tympanic membrane.          Assessment & Plan:  Bilateral cerumen impaction-recommended treatment is bilateral irrigation.  This was irrigation performed today.  Patient tolerated well.  Encouraged her to use the drops for a few days each month to keep from building up the cerumen.  Beatrice Lecher, MD  patient tolerated well.

## 2017-09-04 ENCOUNTER — Other Ambulatory Visit: Payer: Self-pay | Admitting: Family Medicine

## 2017-09-18 ENCOUNTER — Encounter: Payer: Self-pay | Admitting: Family Medicine

## 2017-09-18 ENCOUNTER — Ambulatory Visit (INDEPENDENT_AMBULATORY_CARE_PROVIDER_SITE_OTHER): Payer: Medicare HMO | Admitting: Family Medicine

## 2017-09-18 VITALS — BP 134/82 | HR 71 | Ht 64.0 in | Wt 127.0 lb

## 2017-09-18 DIAGNOSIS — F329 Major depressive disorder, single episode, unspecified: Secondary | ICD-10-CM

## 2017-09-18 DIAGNOSIS — K1230 Oral mucositis (ulcerative), unspecified: Secondary | ICD-10-CM | POA: Diagnosis not present

## 2017-09-18 DIAGNOSIS — I10 Essential (primary) hypertension: Secondary | ICD-10-CM | POA: Diagnosis not present

## 2017-09-18 DIAGNOSIS — F32A Depression, unspecified: Secondary | ICD-10-CM

## 2017-09-18 DIAGNOSIS — R69 Illness, unspecified: Secondary | ICD-10-CM | POA: Diagnosis not present

## 2017-09-18 MED ORDER — HYDROXYZINE HCL 10 MG PO TABS: 10.0000 mg | ORAL_TABLET | Freq: Every day | ORAL | 0 refills | Status: DC | PRN

## 2017-09-18 NOTE — Progress Notes (Addendum)
Subjective:    Patient ID: [name], female    DOB: 04-13-1933, 84 y.o.   MRN: [id number]  HPI 59-month follow-up for depression-she says she is at a place where she would like to consider weaning off her Wellbutrin.she has been on it for a year.  She would like to have something that she can just take occasionally if she feels overwhelmed just as a backup.  Hypertension- Pt denies chest pain, SOB, dizziness, or heart palpitations.  Taking meds as directed w/o problems.  Denies medication side effects.  He also brought home blood pressure log.  She had about 3 blood pressures that were mildly elevated in the low 140 range but overall they looked great.  He still dealing with some sore spots on her inner mouth.  She is also been getting some cracks in her tongue.  In fact the last time I saw her we did some blood work just to rule out some deficiencies.  She has an appointment coming up with her dentist soon.  She actually wonders if it could be a medication side effect.    Review of Systems  BP 134/82   Pulse 71   Ht 5\' 4"  (1.626 m)   Wt 127 lb (57.6 kg)   SpO2 100%   BMI 21.80 kg/m     Allergies  Allergen Reactions  . Erythromycin Swelling  . Penicillins Swelling and Rash    Past Medical History:  Diagnosis Date  . Asthma, exercise induced   . Difficulty hearing   . History of parathyroid disease   . HTN (hypertension)   . Macular degeneration   . Migraine headache without aura   . OP (osteoporosis)   . Problems related to lack of adequate sleep     Past Surgical History:  Procedure Laterality Date  . ABDOMINAL HYSTERECTOMY     she thinks ovaries removed as well.    Marland Kitchen Anterior acromionectomy  07/31/00   left  . BREAST LUMPECTOMY  1990  . CATARACT EXTRACTION  [id number]  . PARATHYROIDECTOMY  09/06/04   Minimally invasive parathyroid surgery by Dr. Charise Killian in [place], [place].  . repair of torn rotator cuff  07/31/00   left, and 2007  . TONSILLECTOMY       Social History   Socioeconomic History  . Marital status: Married    Spouse name: [name]  . Number of children: 2  . Years of education: Not on file  . Highest education level: Not on file  Social Needs  . Financial resource strain: Not on file  . Food insecurity - worry: Not on file  . Food insecurity - inability: Not on file  . Transportation needs - medical: Not on file  . Transportation needs - non-medical: Not on file  Occupational History  . Occupation: Games developer: RETIRED    Comment: retired  Tobacco Use  . Smoking status: Never Smoker  . Smokeless tobacco: Never Used  Substance and Sexual Activity  . Alcohol use: No  . Drug use: No  . Sexual activity: Yes    Partners: Male  Other Topics Concern  . Not on file  Social History Narrative   At 4 years of college. No regular exercise. No significant caffeine intake.    Family History  Problem Relation Age of Onset  . Heart disease Father     Outpatient Encounter Medications as of 09/18/2017  Medication Sig  . albuterol (PROAIR HFA) 108 (90 Base) MCG/ACT  inhaler inhale 2 puffs by mouth every 6 hours if needed  . AMBULATORY NON FORMULARY MEDICATION Medication Name: Tdap IM x 1  . amLODipine (NORVASC) 5 MG tablet TAKE 1 TABLET BY MOUTH EVERY DAY  . ANORO ELLIPTA 62.5-25 MCG/INH AEPB INHALE 1 PUFF ONCE DAILY  . aspirin 81 MG tablet Take 81 mg by mouth daily.  . B Complex Vitamins (VITAMIN B COMPLEX PO) Take 1 tablet by mouth every other day.  . calcium citrate-vitamin D (CITRACAL+D) 315-200 MG-UNIT tablet Take 2 tablets by mouth 2 (two) times daily.  . Cholecalciferol (VITAMIN D3) 1000 units CAPS Take 1 capsule by mouth daily.  Marland Kitchen losartan (COZAAR) 100 MG tablet TAKE 1 TABLET BY MOUTH EVERY DAY  . Magnesium 200 MG TABS Take 1 tablet by mouth daily.   . Melatonin 10 MG CAPS Take 1 capsule by mouth as needed.  . Multiple Vitamins-Minerals (PRESERVISION AREDS 2) CAPS Take 1 tablet by mouth 2 (two)  times daily.  . vitamin C (ASCORBIC ACID) 500 MG tablet Take 500 mg by mouth daily.  . [DISCONTINUED] ALPRAZolam (XANAX) 0.25 MG tablet Take 0.5-2 tablets (0.125-0.5 mg total) by mouth at bedtime as needed for sleep.  . [DISCONTINUED] buPROPion (WELLBUTRIN XL) 150 MG 24 hr tablet TAKE 1 TABLET EVERY MORNING  . hydrOXYzine (ATARAX/VISTARIL) 10 MG tablet Take 1 tablet (10 mg total) by mouth daily as needed.   No facility-administered encounter medications on file as of 09/18/2017.          Objective:   Physical Exam  Constitutional: She is oriented to person, place, and time. She appears well-developed and well-nourished.  HENT:  Head: Normocephalic and atraumatic.  Have some cracks on the end of her tongue.  Cardiovascular: Normal rate, regular rhythm and normal heart sounds.  Pulmonary/Chest: Effort normal and breath sounds normal.  Neurological: She is alert and oriented to person, place, and time.  Skin: Skin is warm and dry.  Psychiatric: She has a normal mood and affect. Her behavior is normal.          Assessment & Plan:  Depression- Well controlled. Continue current regimen. Follow up in  6 months.  We will start to wean the Wellbutrin.  Recommend take 1 tab every other day for 2 weeks and then stop.  If at any point she is noticing change in symptoms then she can let us know.  Also send over prescription for hydroxyzine to use as needed.  PHQ- 2 score of 1.     HTN -controlled.  Continue current regimen.  Mucositis-unable to find as a potential side effect of the Wellbutrin but certainly if it is causing it and it should get better within a couple weeks of coming off the medication.  If not then encouraged her to address it with her dentist.

## 2017-09-18 NOTE — Patient Instructions (Signed)
Decrease Wellbutrin to every other day for 2 weeks and then stop the medication

## 2017-10-10 ENCOUNTER — Encounter: Payer: Self-pay | Admitting: Physician Assistant

## 2017-10-10 ENCOUNTER — Ambulatory Visit (INDEPENDENT_AMBULATORY_CARE_PROVIDER_SITE_OTHER): Payer: Medicare HMO | Admitting: Physician Assistant

## 2017-10-10 VITALS — BP 168/72 | HR 82 | Temp 98.5°F | Ht 64.0 in | Wt 130.0 lb

## 2017-10-10 DIAGNOSIS — J069 Acute upper respiratory infection, unspecified: Secondary | ICD-10-CM | POA: Diagnosis not present

## 2017-10-10 DIAGNOSIS — J040 Acute laryngitis: Secondary | ICD-10-CM

## 2017-10-10 MED ORDER — BENZONATATE 100 MG PO CAPS: 100.0000 mg | ORAL_CAPSULE | Freq: Three times a day (TID) | ORAL | 0 refills | Status: DC | PRN

## 2017-10-10 NOTE — Progress Notes (Signed)
   Subjective:    Patient ID: [name], female    DOB: [birth year], 84 y.o.   MRN: [id number]  HPI  Patient is an 84 year old pleasant female with history of hypertension, COPD, exercise-induced asthma who presents to the clinic with 5 days of sinus pressure, postnasal drip, sore throat, nonproductive cough. This morning she lost her voice. She denies any fever, chills, body aches, wheezing, shortness of breath or chest tightness.  She certainly has some ear popping but no pain.  All of her symptoms seem to be contained in her head.  Her sinus pressure and nasal congestion have improved.  She is using Mucinex and a Nettie pot.  She does not get sick very often.  Her last cold was in 2016. She has not had to use any rescue inhaler. She uses anoro daily.   .. Active Ambulatory Problems    Diagnosis Date Noted  . Asthma, exercise induced   . Macular degeneration   . Palpitations 03/29/2012  . Vitamin D deficiency 06/15/2012  . Internal hemorrhoid 06/15/2012  . COPD (chronic obstructive pulmonary disease) ([place]) 06/15/2012  . Fibrocystic breast changes 06/16/2012  . 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 Essential hypertension 03/10/2014  . Insomnia 07/13/2016  . Depression, acute 03/20/2017  . Aortic atherosclerosis ([place]) 03/20/2017  . Arthritis of carpometacarpal [facility]) joint of right thumb 07/17/2017   Resolved Ambulatory Problems    Diagnosis Date Noted  . HTN (hypertension)   . Abscess of skin 03/09/2015   Past Medical History:  Diagnosis Date  . Asthma, exercise induced   . Difficulty hearing   . History of parathyroid disease   . HTN (hypertension)   . Macular degeneration   . Migraine headache without aura   . OP (osteoporosis)   . Problems related to lack of adequate sleep       Review of Systems See HPI.     Objective:   Physical Exam  Constitutional: She is oriented to person, place, and time. She appears well-developed and well-nourished.  HENT:  Head:  Normocephalic and atraumatic.  Right Ear: External ear normal.  Left Ear: External ear normal.  Nose: Nose normal.  Mouth/Throat: Oropharynx is clear and moist. No oropharyngeal exudate.  TM's clear bilaterally.  Oropharynx clear with some PND. No tonsilar swelling or exudate.   Eyes:  Conjunctiva irritated with watery discharge.   Neck: Normal range of motion. Neck supple.  Cardiovascular: Normal rate, regular rhythm and normal heart sounds.  Pulmonary/Chest: Effort normal and breath sounds normal. She has no wheezes.  Lymphadenopathy:    She has no cervical adenopathy.  Neurological: She is alert and oriented to person, place, and time.  Skin: No rash noted.  Psychiatric: She has a normal mood and affect. Her behavior is normal.          Assessment & Plan:  Marland KitchenMarland KitchenDiagnoses and all orders for this visit:  Acute upper respiratory infection -     benzonatate (TESSALON) 100 MG capsule; Take 1 capsule (100 mg total) by mouth 3 (three) times daily as needed for cough.  Laryngitis   Reassurance that I saw no signs of bacterial infection today. Lungs sound great. I would like for her to get tylenol cold sinus severe, use flonase, use humidifer, use tessalon pearls if needed, rest and hydrate for a few more days. If no improvement or worsening in next few days we could consider antibiotic. Call with any symptoms changes.

## 2017-10-10 NOTE — Patient Instructions (Addendum)
Tylenol cold sinus severe  2 tablets every 4-6 hours 2 days.    Upper Respiratory Infection, Adult Most upper respiratory infections (URIs) are caused by a virus. A URI affects the nose, throat, and upper air passages. The most common type of URI is often called "the common cold." Follow these instructions at home:  Take medicines only as told by your doctor.  Gargle warm saltwater or take cough drops to comfort your throat as told by your doctor.  Use a warm mist humidifier or inhale steam from a shower to increase air moisture. This may make it easier to breathe.  Drink enough fluid to keep your pee (urine) clear or pale yellow.  Eat soups and other clear broths.  Have a healthy diet.  Rest as needed.  Go back to work when your fever is gone or your doctor says it is okay. ? You may need to stay home longer to avoid giving your URI to others. ? You can also wear a face mask and wash your hands often to prevent spread of the virus.  Use your inhaler more if you have asthma.  Do not use any tobacco products, including cigarettes, chewing tobacco, or electronic cigarettes. If you need help quitting, ask your doctor. Contact a doctor if:  You are getting worse, not better.  Your symptoms are not helped by medicine.  You have chills.  You are getting more short of breath.  You have brown or red mucus.  You have yellow or brown discharge from your nose.  You have pain in your face, especially when you bend forward.  You have a fever.  You have puffy (swollen) neck glands.  You have pain while swallowing.  You have white areas in the back of your throat. Get help right away if:  You have very bad or constant: ? Headache. ? Ear pain. ? Pain in your forehead, behind your eyes, and over your cheekbones (sinus pain). ? Chest pain.  You have long-lasting (chronic) lung disease and any of the following: ? Wheezing. ? Long-lasting cough. ? Coughing up blood. ? A  change in your usual mucus.  You have a stiff neck.  You have changes in your: ? Vision. ? Hearing. ? Thinking. ? Mood. This information is not intended to replace advice given to you by your health care provider. Make sure you discuss any questions you have with your health care provider. Document Released: 01/04/2008 Document Revised: 03/20/2016 Document Reviewed: 10/23/2013 Elsevier Interactive Patient Education  2018 [employer].

## 2017-10-11 ENCOUNTER — Encounter: Payer: Self-pay | Admitting: Physician Assistant

## 2017-10-26 ENCOUNTER — Encounter: Payer: Self-pay | Admitting: Family Medicine

## 2017-10-26 ENCOUNTER — Ambulatory Visit (INDEPENDENT_AMBULATORY_CARE_PROVIDER_SITE_OTHER): Payer: Medicare HMO | Admitting: Family Medicine

## 2017-10-26 VITALS — BP 125/64 | HR 71 | Ht 64.0 in | Wt 130.0 lb

## 2017-10-26 DIAGNOSIS — J449 Chronic obstructive pulmonary disease, unspecified: Secondary | ICD-10-CM | POA: Diagnosis not present

## 2017-10-26 DIAGNOSIS — J0100 Acute maxillary sinusitis, unspecified: Secondary | ICD-10-CM | POA: Diagnosis not present

## 2017-10-26 DIAGNOSIS — J301 Allergic rhinitis due to pollen: Secondary | ICD-10-CM

## 2017-10-26 MED ORDER — DOXYCYCLINE HYCLATE 100 MG PO TABS: 100.0000 mg | ORAL_TABLET | Freq: Two times a day (BID) | ORAL | 0 refills | Status: DC

## 2017-10-26 NOTE — Patient Instructions (Addendum)
Go ahead and restart your nasal spray that you normally use for hayfever.  If it is out of date can recommend to pick up a bottle over-the-counter of Flonase or either Nasonex.  1 spray in each nostril once a day for 2 weeks. Make sure to complete the antibiotics. If you are not better in 1 week then please let me know. Keep using your Anoro daily and then use her albuterol as needed.

## 2017-10-26 NOTE — Progress Notes (Signed)
Subjective:    Patient ID: [name], female    DOB: 06-Jul-1933, 84 y.o.   MRN: [id number]  HPI 84 yo female with COPD was here on 3/12 and dx with URI.  she still has some pain in her lymph nodes behind her left ear.  She hasn't tried to [employer].  He says the coughing really is not a problem unless she just tries to get up and be really active.  The only thing that seems to trigger her cough and when she does cough is more dry.  She is also felt a little tight across her chest.  She has been using her albuterol, and that does seem to help..  Small amt of post nasal drip. No fever, chills or sweats.  Though she is been having some temperatures to about 98.6 which she says for her is high and when she gets that high she does not feel well.  She says is been happening almost every night for the last week.  She has been sick for about 21 days.   She called yesterday to get an appointment because she had severe pain on the left side of her face and in her left ear and down into the left side of her neck.  Is also been using her Nettie pot and salt water gargles.  She was feels like there are sores in the back of her throat.  She has been getting some itching and sneezing in her nose as well similar to when she has hayfever in the fall.   Review of Systems  BP (!) 162/78   Pulse 71   Ht 5\' 4"  (1.626 m)   Wt 130 lb (59 kg)   SpO2 100%   BMI 22.31 kg/m     Allergies  Allergen Reactions  . Erythromycin Swelling  . Penicillins Swelling and Rash    Past Medical History:  Diagnosis Date  . Asthma, exercise induced   . Difficulty hearing   . History of parathyroid disease   . HTN (hypertension)   . Macular degeneration   . Migraine headache without aura   . OP (osteoporosis)   . Problems related to lack of adequate sleep     Past Surgical History:  Procedure Laterality Date  . ABDOMINAL HYSTERECTOMY     she thinks ovaries removed as well.    Marland Kitchen Anterior acromionectomy   07/31/00   left  . BREAST LUMPECTOMY  1990  . CATARACT EXTRACTION  [id number]  . PARATHYROIDECTOMY  09/06/04   Minimally invasive parathyroid surgery by Dr. Charise Killian in [place], [place].  . repair of torn rotator cuff  07/31/00   left, and 2007  . TONSILLECTOMY      Social History   Socioeconomic History  . Marital status: Married    Spouse name: [name]  . Number of children: 2  . Years of education: Not on file  . Highest education level: Not on file  Occupational History  . 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 Financial resource strain: Not on file  . Food insecurity:    Worry: Not on file    Inability: Not on file  . Transportation needs:    Medical: Not on file    Non-medical: Not on file  Tobacco Use  . Smoking status: Never Smoker  . Smokeless tobacco: Never Used  Substance and Sexual Activity  . Alcohol use: No  .  Drug use: No  . Sexual activity: Yes    Partners: Male  Lifestyle  . Physical activity:    Days per week: Not on file    Minutes per session: Not on file  . Stress: Not on file  Relationships  . Social connections:    Talks on phone: Not on file    Gets together: Not on file    Attends religious service: Not on file    Active member of club or organization: Not on file    Attends meetings of clubs or organizations: Not on file    Relationship status: Not on file  . Intimate partner violence:    Fear of current or ex partner: Not on file    Emotionally abused: Not on file    Physically abused: Not on file    Forced sexual activity: Not on file  Other Topics Concern  . Not on file  Social History Narrative   At 4 years of college. No regular exercise. No significant caffeine intake.    Family History  Problem Relation Age of Onset  . Heart disease Father     Outpatient Encounter Medications as of 10/26/2017  Medication Sig  . albuterol (PROAIR HFA) 108 (90 Base) MCG/ACT inhaler inhale 2  puffs by mouth every 6 hours if needed  . amLODipine (NORVASC) 5 MG tablet TAKE 1 TABLET BY MOUTH EVERY DAY  . ANORO ELLIPTA 62.5-25 MCG/INH AEPB INHALE 1 PUFF ONCE DAILY  . aspirin 81 MG tablet Take 81 mg by mouth daily.  . B Complex Vitamins (VITAMIN B COMPLEX PO) Take 1 tablet by mouth every other day.  . calcium citrate-vitamin D (CITRACAL+D) 315-200 MG-UNIT tablet Take 2 tablets by mouth 2 (two) times daily.  . Cholecalciferol (VITAMIN D3) 1000 units CAPS Take 1 capsule by mouth daily.  . hydrOXYzine (ATARAX/VISTARIL) 10 MG tablet Take 1 tablet (10 mg total) by mouth daily as needed.  Marland Kitchen losartan (COZAAR) 100 MG tablet TAKE 1 TABLET BY MOUTH EVERY DAY  . Magnesium 200 MG TABS Take 1 tablet by mouth daily.   . Melatonin 10 MG CAPS Take 1 capsule by mouth as needed.  . Multiple Vitamins-Minerals (PRESERVISION AREDS 2) CAPS Take 1 tablet by mouth 2 (two) times daily.  . vitamin C (ASCORBIC ACID) 500 MG tablet Take 500 mg by mouth daily.  . [DISCONTINUED] AMBULATORY NON FORMULARY MEDICATION Medication Name: Tdap IM x 1  . doxycycline (VIBRA-TABS) 100 MG tablet Take 1 tablet (100 mg total) by mouth 2 (two) times daily.  . [DISCONTINUED] benzonatate (TESSALON) 100 MG capsule Take 1 capsule (100 mg total) by mouth 3 (three) times daily as needed for cough. (Patient not taking: Reported on 10/26/2017)   No facility-administered encounter medications on file as of 10/26/2017.           Objective:   Physical Exam  Constitutional: She is oriented to person, place, and time. She appears well-developed and well-nourished.  HENT:  Head: Normocephalic and atraumatic.  Right Ear: External ear normal.  Left Ear: External ear normal.  Nose: Nose normal.  Mouth/Throat: Oropharynx is clear and moist.  TMs and canals are clear.   Eyes: Pupils are equal, round, and reactive to light. Conjunctivae and EOM are normal.  Neck: Neck supple. No thyromegaly present.  Cardiovascular: Normal rate, regular  rhythm and normal heart sounds.  Pulmonary/Chest: Effort normal and breath sounds normal. She has no wheezes.  Lymphadenopathy:    She has no cervical adenopathy.  Neurological:  She is alert and oriented to person, place, and time.  Skin: Skin is warm and dry.  Psychiatric: She has a normal mood and affect.          Assessment & Plan:  Acute sinusitis-we will treat with doxycycline based on allergies.  If not significantly better in 1 week please let me know.  Continue with Nettie pot.  Also recommend starting a nasal spray as I do think some of this could be related to allergies.  AR - restart her Flonase for 2 weeks.    COPD-to new Anoro daily and use albuterol as needed.

## 2017-11-06 DIAGNOSIS — H353131 Nonexudative age-related macular degeneration, bilateral, early dry stage: Secondary | ICD-10-CM | POA: Diagnosis not present

## 2017-11-06 DIAGNOSIS — H524 Presbyopia: Secondary | ICD-10-CM | POA: Diagnosis not present

## 2017-12-28 DIAGNOSIS — L851 Acquired keratosis [keratoderma] palmaris et plantaris: Secondary | ICD-10-CM | POA: Diagnosis not present

## 2017-12-28 DIAGNOSIS — L821 Other seborrheic keratosis: Secondary | ICD-10-CM | POA: Diagnosis not present

## 2017-12-28 DIAGNOSIS — Z86018 Personal history of other benign neoplasm: Secondary | ICD-10-CM | POA: Diagnosis not present

## 2017-12-28 DIAGNOSIS — D225 Melanocytic nevi of trunk: Secondary | ICD-10-CM | POA: Diagnosis not present

## 2017-12-28 DIAGNOSIS — L237 Allergic contact dermatitis due to plants, except food: Secondary | ICD-10-CM | POA: Diagnosis not present

## 2017-12-28 DIAGNOSIS — D2272 Melanocytic nevi of left lower limb, including hip: Secondary | ICD-10-CM | POA: Diagnosis not present

## 2017-12-28 DIAGNOSIS — Z85828 Personal history of other malignant neoplasm of skin: Secondary | ICD-10-CM | POA: Diagnosis not present

## 2017-12-28 DIAGNOSIS — R238 Other skin changes: Secondary | ICD-10-CM | POA: Diagnosis not present

## 2017-12-28 DIAGNOSIS — L820 Inflamed seborrheic keratosis: Secondary | ICD-10-CM | POA: Diagnosis not present

## 2018-01-09 ENCOUNTER — Other Ambulatory Visit: Payer: Self-pay | Admitting: Family Medicine

## 2018-01-15 ENCOUNTER — Ambulatory Visit (INDEPENDENT_AMBULATORY_CARE_PROVIDER_SITE_OTHER): Payer: Medicare HMO | Admitting: Family Medicine

## 2018-01-15 ENCOUNTER — Encounter: Payer: Self-pay | Admitting: Family Medicine

## 2018-01-15 VITALS — BP 130/82 | HR 63 | Ht 64.0 in | Wt 128.0 lb

## 2018-01-15 DIAGNOSIS — I10 Essential (primary) hypertension: Secondary | ICD-10-CM

## 2018-01-15 DIAGNOSIS — M25552 Pain in left hip: Secondary | ICD-10-CM

## 2018-01-15 DIAGNOSIS — M25562 Pain in left knee: Secondary | ICD-10-CM

## 2018-01-15 DIAGNOSIS — M7122 Synovial cyst of popliteal space [Baker], left knee: Secondary | ICD-10-CM | POA: Diagnosis not present

## 2018-01-15 DIAGNOSIS — J449 Chronic obstructive pulmonary disease, unspecified: Secondary | ICD-10-CM

## 2018-01-15 LAB — BASIC METABOLIC PANEL WITH GFR
BUN: 15 mg/dL (ref 7–25)
CALCIUM: 10.1 mg/dL (ref 8.6–10.4)
CHLORIDE: 107 mmol/L (ref 98–110)
CO2: 28 mmol/L (ref 20–32)
CREATININE: 0.82 mg/dL (ref 0.60–0.88)
GFR, Est African American: 77 mL/min/{1.73_m2} (ref >=60)
GFR, Est Non African American: 66 mL/min/{1.73_m2} (ref >=60)
GLUCOSE: 94 mg/dL (ref 65–99)
Potassium: 4.1 mmol/L (ref 3.5–5.3)
Sodium: 143 mmol/L (ref 135–146)

## 2018-01-15 MED ORDER — DICLOFENAC SODIUM 1 % TD GEL: 2.0000 g | Freq: Four times a day (QID) | TRANSDERMAL | 99 refills | Status: DC

## 2018-01-15 MED ORDER — AMLODIPINE BESYLATE 5 MG PO TABS: 5.0000 mg | ORAL_TABLET | Freq: Every day | ORAL | 1 refills | Status: DC

## 2018-01-15 MED ORDER — UMECLIDINIUM-VILANTEROL 62.5-25 MCG/INH IN AEPB: INHALATION_SPRAY | RESPIRATORY_TRACT | 5 refills | Status: DC

## 2018-01-15 NOTE — Progress Notes (Signed)
Subjective:    CC: BP   HPI:  Hypertension- Pt denies chest pain, SOB, dizziness, or heart palpitations.  Taking meds as directed w/o problems.  Denies medication side effects.  On losartan and amlodipine.  F/U COPD - doing well overall.  She is on Anoro and PRN ProAir.  Recent flares or exacerbations.  She also complains of left hip pain.  She is had bursitis in that hip before and actually took MSM last summer and it did help.  Is been starting to aggravate her again more recently after she rode her riding lawnmower.  She thinks the vibration kind of got irritated.  She started taking MSM again about 2 weeks ago and she really wants to stay away from any type of oral medication which she has tried in the past and says did not really seem to help.  I am assuming she is talking about an anti-inflammatory.  She also complains of joint pain in other joints but particularly her left knee.  She does have a Baker's cyst on that side and says she is been doing a lot more kneeling and crouching since starting to work in the yard over the last month.  And its been a little bit irritated and inflamed.  Past medical history, Surgical history, Family history not pertinant except as noted below, Social history, Allergies, and medications have been entered into the medical record, reviewed, and corrections made.   Review of Systems: No fevers, chills, night sweats, weight loss, chest pain, or shortness of breath.   Objective:    General: Well Developed, well nourished, and in no acute distress.  Neuro: Alert and oriented x3, extra-ocular muscles intact, sensation grossly intact.  HEENT: Normocephalic, atraumatic  Skin: Warm and dry, no rashes. Cardiac: Regular rate and rhythm, no murmurs rubs or gallops, no lower extremity edema.  Respiratory: Clear to auscultation bilaterally. Not using accessory muscles, speaking in full sentences.   Impression and Recommendations:    HTN - Well controlled.  Continue current regimen. Follow up in  6 months.   Left hip pain- Recommend a trial of diclofenac gel.  Continue see chiropractor as needed since this does seem to help.  She also started her MSM which is helped her in the past.  COPD- stable. No recent flares.    Left knee pain with Baker's cyst - Recommend a trial of diclofenac gel.  If it continues to bother her recommend that she follow-up with 1 of our sports medicine providers to possibly have some fluid aspirated from the knee I think that would help relieve some of her pain if she feels like it starting to keep her from doing her activities.

## 2018-01-16 NOTE — Progress Notes (Signed)
All labs are normal. 

## 2018-05-07 DIAGNOSIS — R69 Illness, unspecified: Secondary | ICD-10-CM | POA: Diagnosis not present

## 2018-05-08 DIAGNOSIS — R69 Illness, unspecified: Secondary | ICD-10-CM | POA: Diagnosis not present

## 2018-06-27 ENCOUNTER — Other Ambulatory Visit: Payer: Self-pay | Admitting: Family Medicine

## 2018-07-02 ENCOUNTER — Encounter: Payer: Self-pay | Admitting: Family Medicine

## 2018-07-02 ENCOUNTER — Ambulatory Visit (INDEPENDENT_AMBULATORY_CARE_PROVIDER_SITE_OTHER): Payer: Medicare HMO | Admitting: Family Medicine

## 2018-07-02 VITALS — BP 132/61 | HR 65 | Ht 64.0 in | Wt 123.0 lb

## 2018-07-02 DIAGNOSIS — I10 Essential (primary) hypertension: Secondary | ICD-10-CM

## 2018-07-02 DIAGNOSIS — R69 Illness, unspecified: Secondary | ICD-10-CM | POA: Diagnosis not present

## 2018-07-02 DIAGNOSIS — J449 Chronic obstructive pulmonary disease, unspecified: Secondary | ICD-10-CM | POA: Diagnosis not present

## 2018-07-02 DIAGNOSIS — F419 Anxiety disorder, unspecified: Secondary | ICD-10-CM

## 2018-07-02 DIAGNOSIS — E559 Vitamin D deficiency, unspecified: Secondary | ICD-10-CM | POA: Diagnosis not present

## 2018-07-02 NOTE — Progress Notes (Signed)
Subjective:    CC: HTN   HPI:  Hypertension- Pt denies chest pain, SOB, dizziness, or heart palpitations.  Taking meds as directed w/o problems.  Denies medica side effects.  Has been tracking her blood pressures.    Anixety - She says that over the last 3 months she is been taking Ashwaghanda which has actually been helping her sleep and she feels like it actually has reduced some of her anxiety as well.  Since then her blood pressures have been really well controlled.  Prior to that she was getting some elevated blood pressures in the middle the night.  She would wake up feeling anxious  F/U COPD - she is on Anoro daily and using her proair PRN.  She takes it daily.  She says she is actually only had to use her rescue inhaler once or twice over this last 4 months.  Past medical history, Surgical history, Family history not pertinant except as noted below, Social history, Allergies, and medications have been entered into the medical record, reviewed, and corrections made.   Review of Systems: No fevers, chills, night sweats, weight loss, chest pain, or shortness of breath.   Objective:    General: Well Developed, well nourished, and in no acute distress.  Neuro: Alert and oriented x3, extra-ocular muscles intact, sensation grossly intact.  HEENT: Normocephalic, atraumatic  Skin: Warm and dry, no rashes. Cardiac: Regular rate and rhythm, no murmurs rubs or gallops, no lower extremity edema.  Respiratory: Clear to auscultation bilaterally. Not using accessory muscles, speaking in full sentences.   Impression and Recommendations:    HTN - Well controlled. Continue current regimen. Follow up in  6 months.    COPD -stable.  Doing really well on Anoro.  We will try to get her samples through the end of the year now that she has had her Medicare gap.  Vit d def - due to recheck levels.   Anxiety-doing well with over-the-counter supplement that she is been taking for the last 3 months.  Is  been helping her sleep and she has not needed to take any of the hydroxyzine or melatonin.  OK to stop daily aspirin since she has no prior history of coronary artery disease or stroke.

## 2018-07-03 ENCOUNTER — Telehealth: Payer: Self-pay | Admitting: Family Medicine

## 2018-07-03 LAB — LIPID PANEL
CHOL/HDL RATIO: 2.9 (calc) (ref <5.0)
CHOLESTEROL: 204 mg/dL — AB (ref <200)
HDL: 70 mg/dL (ref >50)
LDL CHOLESTEROL (CALC): 117 mg/dL — AB
NON-HDL CHOLESTEROL (CALC): 134 mg/dL — AB (ref <130)
Triglycerides: 78 mg/dL (ref <150)

## 2018-07-03 LAB — COMPLETE METABOLIC PANEL WITH GFR
AG RATIO: 1.8 (calc) (ref 1.0–2.5)
ALKALINE PHOSPHATASE (APISO): 90 U/L (ref 33–130)
ALT: 17 U/L (ref 6–29)
AST: 20 U/L (ref 10–35)
Albumin: 4.3 g/dL (ref 3.6–5.1)
BUN: 20 mg/dL (ref 7–25)
CALCIUM: 9.9 mg/dL (ref 8.6–10.4)
CHLORIDE: 105 mmol/L (ref 98–110)
CO2: 26 mmol/L (ref 20–32)
Creat: 0.83 mg/dL (ref 0.60–0.88)
GFR, Est African American: 76 mL/min/{1.73_m2} (ref >=60)
GFR, Est Non African American: 65 mL/min/{1.73_m2} (ref >=60)
Globulin: 2.4 g/dL (calc) (ref 1.9–3.7)
Glucose, Bld: 94 mg/dL (ref 65–99)
POTASSIUM: 4.1 mmol/L (ref 3.5–5.3)
Sodium: 141 mmol/L (ref 135–146)
Total Bilirubin: 0.6 mg/dL (ref 0.2–1.2)
Total Protein: 6.7 g/dL (ref 6.1–8.1)

## 2018-07-03 LAB — VITAMIN D 25 HYDROXY (VIT D DEFICIENCY, FRACTURES): Vit D, 25-Hydroxy: 46 ng/mL (ref 30–100)

## 2018-07-03 NOTE — Telephone Encounter (Signed)
I left an e-mail with our Anoro Representative and she will come by one day this week to drop off samples. I called the patient and let her know we should get some samples in the office Wednesday or Thursday this week. She voices understanding.

## 2018-07-04 NOTE — Telephone Encounter (Signed)
Left message for patient that 2 samples of Anoro will be up front in the desk. Patient was given times to stop by the office and was asked to call back with any questions.

## 2018-07-09 NOTE — Progress Notes (Signed)
Subjective:   [name] is a 84 y.o. female who presents for Medicare Annual (Subsequent) preventive examination.  Review of Systems:  No ROS.  Medicare Wellness Visit. Additional risk factors are reflected in the social history.  Cardiac Risk Factors include: advanced age (>17men, >6 women);hypertension Sleep patterns: Getting 8 hours of sleep a night.Waakes up and feels rested. Wakes up 1 time during the night to go to the bathroom.   Home Safety/Smoke Alarms: Feels safe in home. Smoke alarms in place.  Living environment; Lives with [name] in 1 story home. Hand rails are in place on the steps. Shower is a walk in shower with grab bar in place. Seat Belt Safety/Bike Helmet: Wears seat belt.   Female:   Pap-  Aged out     Mammo-   utd   Dexa scan- offered to schedule and patient declined       CCS- aged out      Objective:     Vitals: There were no vitals taken for this visit.  There is no height or weight on file to calculate BMI.  Advanced Directives 07/18/2018 07/17/2017 07/01/2014 01/08/2014  Does Patient Have a Medical Advance Directive? Yes Yes Yes Patient has advance directive, copy not in chart  Type of Advance Directive [place];Living will [place];Living will Living will Living will  Does patient want to make changes to medical advance directive? No - Patient declined - - -  Copy of [place] in Chart? No - copy requested No - copy requested No - copy requested -    Tobacco Social History   Tobacco Use  Smoking Status Never Smoker  Smokeless Tobacco Never Used     Counseling given: Not Answered   Clinical Intake:  Pre-visit preparation completed: Yes  Pain : No/denies pain     Nutritional Risks: None Diabetes: No  How often do you need to have someone help you when you read instructions, pamphlets, or other written materials from your doctor or pharmacy?: 1 - Never What is the last  grade level you completed in school?: 16  Interpreter Needed?: No  Information entered by :: Orlie Dakin, LPN  Past Medical History:  Diagnosis Date  . Asthma, exercise induced   . Difficulty hearing   . History of parathyroid disease   . HTN (hypertension)   . Macular degeneration   . Migraine headache without aura   . OP (osteoporosis)   . Problems related to lack of adequate sleep    Past Surgical History:  Procedure Laterality Date  . ABDOMINAL HYSTERECTOMY     she thinks ovaries removed as well.    Marland Kitchen Anterior acromionectomy  07/31/00   left  . BREAST LUMPECTOMY  1990  . CATARACT EXTRACTION  [id number]  . PARATHYROIDECTOMY  09/06/04   Minimally invasive parathyroid surgery by Dr. Charise Killian in [place], [place].  . repair of torn rotator cuff  07/31/00   left, and 2007  . TONSILLECTOMY     Family History  Problem Relation Age of Onset  . Heart disease Father    Social History   Socioeconomic History  . Marital status: Married    Spouse name: [name]  . Number of children: 2  . Years of education: 16  . Highest education level: Bachelor's degree (e.g., BA, AB, BS)  Occupational History  . 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  Financial resource strain: Not hard at all  . Food insecurity:    Worry: Never true    Inability: Never true  . Transportation needs:    Medical: No    Non-medical: No  Tobacco Use  . Smoking status: Never Smoker  . Smokeless tobacco: Never Used  Substance and Sexual Activity  . Alcohol use: No  . Drug use: No  . Sexual activity: Yes    Partners: Male  Lifestyle  . Physical activity:    Days per week: 0 days    Minutes per session: 0 min  . Stress: To some extent  Relationships  . Social connections:    Talks on phone: More than three times a week    Gets together: Twice a week    Attends religious service: More than 4 times per year    Active member of club or organization: No     Attends meetings of clubs or organizations: Never    Relationship status: Married  Other Topics Concern  . Not on file  Social History Narrative   At 4 years of college. No regular exercise. No significant caffeine intake.Doesn't like to drive as much as she used to.    Outpatient Encounter Medications as of 07/18/2018  Medication Sig  . albuterol (PROAIR HFA) 108 (90 Base) MCG/ACT inhaler inhale 2 puffs by mouth every 6 hours if needed  . amLODipine (NORVASC) 5 MG tablet Take 1 tablet (5 mg total) by mouth daily.  . ASHWAGANDHA PO Take by mouth.  . B Complex Vitamins (VITAMIN B COMPLEX PO) Take 1 tablet by mouth every other day.  . Calcium Citrate-Vitamin D (CALCIUM CITRATE + D PO) Take 1 tablet by mouth 2 (two) times daily.  . Cholecalciferol (VITAMIN D3) 1000 units CAPS Take 1 capsule by mouth daily.  Marland Kitchen losartan (COZAAR) 100 MG tablet TAKE 1 TABLET BY MOUTH EVERY DAY  . Magnesium 200 MG TABS Take 4 tablets by mouth daily.  . Melatonin 10 MG CAPS Take 1 capsule by mouth as needed.  . Multiple Vitamins-Minerals (PRESERVISION AREDS 2) CAPS Take 1 tablet by mouth 2 (two) times daily.  Marland Kitchen umeclidinium-vilanterol (ANORO ELLIPTA) 62.5-25 MCG/INH AEPB INHALE 1 PUFF ONCE DAILY  . vitamin C (ASCORBIC ACID) 500 MG tablet Take 500 mg by mouth daily.   No facility-administered encounter medications on file as of 07/18/2018.     Activities of Daily Living In your present state of health, do you have any difficulty performing the following activities: 07/18/2018  Hearing? Y  Comment has noticed a decline in the hearing. Wants to see an audiologist in 2020 to discuss hearing aids.  Vision? N  Comment uses readers. Has early macular degenerartion.  Difficulty concentrating or making decisions? N  Walking or climbing stairs? N  Dressing or bathing? N  Doing errands, shopping? N  Preparing Food and eating ? N  Using the Toilet? N  In the past six months, have you accidently leaked urine? N  Do  you have problems with loss of bowel control? N  Managing your Medications? N  Managing your Finances? N  Housekeeping or managing your Housekeeping? N  Some recent data might be hidden    Patient Care Team: [name], MD as PCP - General (Family Medicine) Renelda Loma, Myrtle (Optometry) Crista Luria, MD as Attending Physician (Dermatology) Melida Quitter, MD as Attending Physician (Otolaryngology) Toribio Harbour, DC (Chiropractic Medicine)    Assessment:   This is a routine wellness examination  for [place].Physical assessment deferred to PCP.   Exercise Activities and Dietary recommendations Current Exercise Habits: The patient does not participate in regular exercise at present Diet Eats a healthy diet with fruits and vegetables and proteins. Breakfast: Mini wheat cereal with fruit and nuts on top with milk. Lunch: will eat out if out running errands. If home  Sandwich and a salad Dinner: Meat and vegetable or soups and sandwich.       Goals    . Exercise 3x per week (30 min per time)     Try to start walking at least 3 times a week for 30 minutes at a time.       Fall Risk Fall Risk  07/18/2018 01/15/2018 07/17/2017 07/13/2016 10/05/2015  Falls in the past year? 0 No No No No  Risk for fall due to : Impaired balance/gait - - - -  Follow up (No Data) - - - -  Comment discussed fall risk - - - -   Is the patient's home free of loose throw rugs in walkways, pet beds, electrical cords, etc?   yes      Grab bars in the bathroom? yes      Handrails on the stairs?   yes      Adequate lighting?   yes   Depression Screen PHQ 2/9 Scores 07/18/2018 01/15/2018 07/17/2017 03/20/2017  PHQ - 2 Score 0 0 2 0  PHQ- 9 Score - - 7 0     Cognitive Function     6CIT Screen 07/18/2018 07/17/2017 07/13/2016  What Year? 0 points 0 points 0 points  What month? 0 points 0 points 0 points  What time? 0 points 0 points 0 points  Count back from 20 0 points 0 points 0 points   Months in reverse 2 points 0 points 0 points  Repeat phrase 2 points 0 points 2 points  Total Score 4 0 2    Immunization History  Administered Date(s) Administered  . Influenza Split 05/07/2012  . Influenza, High Dose Seasonal PF 05/28/2013, 05/02/2017, 05/07/2018  . Influenza,inj,Quad PF,6+ Mos 04/23/2014, 05/14/2015  . Influenza-Unspecified 05/09/2016  . Pneumococcal Conjugate-13 07/01/2014  . Pneumococcal Polysaccharide-23 08/01/2005  . Td 08/01/2001  . Tdap 08/01/2005, 10/07/2015     Screening Tests Health Maintenance  Topic Date Due  . TETANUS/TDAP  10/06/2025  . INFLUENZA VACCINE  Completed  . DEXA SCAN  Completed  . PNA vac Low Risk Adult  Completed        Plan:      Ms. Rosemeyer , Thank you for taking time to come for your Medicare Wellness Visit. I appreciate your ongoing commitment to your health goals. Please review the following plan we discussed and let me know if I can assist you in the future.   Please schedule your next medicare wellness visit with me in 1 yr. Bring a copy of your living will and/or healthcare power of attorney to your next office visit.  These are the goals we discussed: Goals    . Exercise 3x per week (30 min per time)     Try to start walking at least 3 times a week for 30 minutes at a time.       This is a list of the screening recommended for you and due dates:  Health Maintenance  Topic Date Due  . Tetanus Vaccine  10/06/2025  . Flu Shot  Completed  . DEXA scan (bone density measurement)  Completed  . Pneumonia vaccines  Completed      I have personally reviewed and noted the following in the patient's chart:   . Medical and social history . Use of alcohol, tobacco or illicit drugs  . Current medications and supplements . Functional ability and status . Nutritional status . Physical activity . Advanced directives . List of other physicians . Hospitalizations, surgeries, and ER visits in previous 12  months . Vitals . Screenings to include cognitive, depression, and falls . Referrals and appointments  In addition, I have reviewed and discussed with patient certain preventive protocols, quality metrics, and best practice recommendations. A written personalized care plan for preventive services as well as general preventive health recommendations were provided to patient.     Joanne Chars, LPN  [id number]

## 2018-07-10 NOTE — Telephone Encounter (Signed)
Patient has picked up Anoro Samples from front desk.

## 2018-07-18 ENCOUNTER — Ambulatory Visit (INDEPENDENT_AMBULATORY_CARE_PROVIDER_SITE_OTHER): Payer: Medicare HMO | Admitting: *Deleted

## 2018-07-18 VITALS — BP 140/80 | HR 83 | Ht 64.0 in | Wt 128.0 lb

## 2018-07-18 DIAGNOSIS — Z0000 Encounter for general adult medical examination without abnormal findings: Secondary | ICD-10-CM

## 2018-07-18 NOTE — Patient Instructions (Signed)
[name] , Thank you for taking time to come for your Medicare Wellness Visit. I appreciate your ongoing commitment to your health goals. Please review the following plan we discussed and let me know if I can assist you in the future.   Please schedule your next medicare wellness visit with me in 1 yr. Bring a copy of your living will and/or healthcare power of attorney to your next office visit.   These are the goals we discussed: Goals    . Exercise 3x per week (30 min per time)     Try to start walking at least 3 times a week for 30 minutes at a time.      Stress Stress is a normal reaction to life events. Stress is what you feel when life demands more than you are used to, or more than you think you can handle. Some stress can be useful, such as studying for a test or meeting a deadline at work. Stress that occurs too often or for too long can cause problems. It can affect your emotional health and interfere with relationships and normal daily activities. Too much stress can weaken your body's defense system (immune system) and increase your risk for physical illness. If you already have a medical problem, stress can make it worse. What are the causes? All sorts of life events can cause stress. An event that causes stress for one person may not be stressful for another person. Major life events, whether positive or negative, commonly cause stress. Examples include:  Losing a job or starting a new job.  Losing a loved one.  Moving to a new town or home.  Getting married or divorced.  Having a baby.  Injury or illness. Less obvious life events can also cause stress, especially if they occur day after day or in combination with each other. Examples include:  Working long hours.  Driving in traffic.  Caring for children.  Being in debt.  Being in a difficult relationship. What are the signs or symptoms? Stress can cause emotional symptoms, including:  Anxiety. This is  feeling worried, afraid, on edge, overwhelmed, or out of control.  Anger, including irritation or impatience.  Depression. This is feeling sad, down, helpless, or guilty.  Trouble focusing, remembering, or making decisions. Stress can cause physical symptoms, including:  Aches and pains. These may affect your head, neck, back, stomach, or other areas of your body.  Tight muscles or a clenched jaw.  Low energy.  Trouble sleeping. Stress can cause unhealthy behaviors, including:  Eating to feel better (overeating) or skipping meals.  Working too much or putting off tasks.  Smoking, drinking alcohol, or using drugs to feel better. How is this diagnosed? Stress is diagnosed through an assessment by your health care provider. He or she may diagnose this condition based on:  Your symptoms and any stressful life events.  Your medical history.  Tests to rule out other causes of your symptoms. Depending on your condition, your health care provider may refer you to a specialist for further evaluation. How is this treated?  Stress management techniques are the recommended treatment for stress. Medicine is not typically recommended for the treatment of stress. Techniques to reduce your reaction to stressful life events include:  Stress identification. Monitor yourself for symptoms of stress and identify what causes stress for you. These skills may help you to avoid or prepare for stressful events.  Time management. Set your priorities, keep a calendar of events, and  learn to say "no." Taking these actions can help you avoid making too many commitments. Techniques for coping with stress include:  Rethinking the problem. Try to think realistically about stressful events rather than ignoring them or overreacting. Try to find the positives in a stressful situation rather than focusing on the negatives.  Exercise. Physical exercise can release both physical and emotional tension. The key  is to find a form of exercise that you enjoy and do it regularly.  Relaxation techniques. These relax the body and mind. The key is to find one or more that you enjoy and use the technique(s) regularly. Examples include: ? Meditation, deep breathing, or progressive relaxation techniques. ? Yoga or tai chi. ? Biofeedback, mindfulness techniques, or journaling. ? Listening to music, being out in nature, or participating in other hobbies.  Practicing a healthy lifestyle. Eat a balanced diet, drink plenty of water, limit or avoid caffeine, and get plenty of sleep.  Having a strong support network. Spend time with family, friends, or other people you enjoy being around. Express your feelings and talk things over with someone you trust. Counseling or talk therapy with a mental health professional may be helpful if you are having trouble managing stress on your own. Follow these instructions at home: Lifestyle   Avoid drugs.  Do not use any products that contain nicotine or tobacco, such as cigarettes and e-cigarettes. If you need help quitting, ask your health care provider.  Limit alcohol intake to no more than 1 drink a day for nonpregnant women and 2 drinks a day for men. One drink equals 12 oz of beer, 5 oz of wine, or 1 oz of hard liquor.  Do not use alcohol or drugs to relax.  Eat a balanced diet that includes fresh fruits and vegetables, whole grains, lean meats, fish, eggs, and beans, and low-fat dairy. Avoid processed foods and foods high in added fat, sugar, and salt.  Exercise at least 30 minutes on 5 or more days each week.  Get 7-8 hours of sleep each night. General instructions   Practice stress management techniques as discussed with your health care provider.  Drink enough fluid to keep your urine clear or pale yellow.  Take over-the-counter and prescription medicines only as told by your health care provider.  Keep all follow-up visits as told by your health care  provider. This is important. Contact a health care provider if:  Your symptoms get worse.  You have new symptoms.  You feel overwhelmed by your problems and can no longer manage them on your own. Get help right away if:  You have thoughts of hurting yourself or others. If you ever feel like you may hurt yourself or others, or have thoughts about taking your own life, get help right away. You can go to your nearest emergency department or call:  Your local emergency services (911 in the [place]  A suicide crisis helpline, such as the [place] at 412 773 6979. This is open 24 hours a day. Summary  Stress is a normal reaction to life events. It can cause problems if it happens too often or for too long.  Practicing stress management techniques is the best way to treat stress.  Counseling or talk therapy with a mental health professional may be helpful if you are having trouble managing stress on your own. This information is not intended to replace advice given to you by your health care provider. Make sure you discuss any questions you have  with your health care provider. Document Released: 01/11/2001 Document Revised: 09/07/2016 Document Reviewed: 09/07/2016 Elsevier Interactive Patient Education  2019 [employer].

## 2018-07-19 ENCOUNTER — Encounter: Payer: Medicare HMO | Admitting: Family Medicine

## 2018-08-02 DIAGNOSIS — Z23 Encounter for immunization: Secondary | ICD-10-CM | POA: Diagnosis not present

## 2018-08-02 DIAGNOSIS — C44622 Squamous cell carcinoma of skin of right upper limb, including shoulder: Secondary | ICD-10-CM | POA: Diagnosis not present

## 2018-08-02 DIAGNOSIS — D485 Neoplasm of uncertain behavior of skin: Secondary | ICD-10-CM | POA: Diagnosis not present

## 2018-08-08 ENCOUNTER — Other Ambulatory Visit: Payer: Self-pay

## 2018-08-08 ENCOUNTER — Other Ambulatory Visit: Payer: Self-pay | Admitting: Family Medicine

## 2018-08-08 DIAGNOSIS — J449 Chronic obstructive pulmonary disease, unspecified: Secondary | ICD-10-CM

## 2018-08-20 DIAGNOSIS — L905 Scar conditions and fibrosis of skin: Secondary | ICD-10-CM | POA: Diagnosis not present

## 2018-08-20 DIAGNOSIS — C44622 Squamous cell carcinoma of skin of right upper limb, including shoulder: Secondary | ICD-10-CM | POA: Diagnosis not present

## 2018-08-31 ENCOUNTER — Other Ambulatory Visit: Payer: Self-pay | Admitting: Family Medicine

## 2018-09-05 DIAGNOSIS — H353131 Nonexudative age-related macular degeneration, bilateral, early dry stage: Secondary | ICD-10-CM | POA: Diagnosis not present

## 2018-09-05 DIAGNOSIS — H524 Presbyopia: Secondary | ICD-10-CM | POA: Diagnosis not present

## 2018-09-17 ENCOUNTER — Ambulatory Visit (INDEPENDENT_AMBULATORY_CARE_PROVIDER_SITE_OTHER): Payer: Medicare HMO | Admitting: Sports Medicine

## 2018-09-17 ENCOUNTER — Encounter: Payer: Self-pay | Admitting: Sports Medicine

## 2018-09-17 DIAGNOSIS — S80851S Superficial foreign body, right lower leg, sequela: Secondary | ICD-10-CM | POA: Diagnosis not present

## 2018-09-17 DIAGNOSIS — M7122 Synovial cyst of popliteal space [Baker], left knee: Secondary | ICD-10-CM | POA: Diagnosis not present

## 2018-09-17 NOTE — Assessment & Plan Note (Signed)
X-rays of the right tib-fib, she endorses a piece of gravel hit her years ago and has been retained since, she has mild tenderness. She also has some burning from her hip and little bit on the anterolateral lower leg, I do think some of this may be radicular, she does have the option of calling me and I can add gabapentin if desired.

## 2018-09-17 NOTE — Progress Notes (Signed)
Subjective:    CC: Knee swelling  HPI: For decades this pleasant 85 year old female has had swelling behind her right knee, does not really bother her, maybe it gets in the way here and there.  She is referred to me for further evaluation and definitive treatment.  In addition she reports a tender spot on her anterolateral right shin, she reports getting struck by a piece of gravel while her husband was mowing the grass decades ago.  It is just now becoming tender.  I reviewed the past medical history, family history, social history, surgical history, and allergies today and no changes were needed.  Please see the problem list section below in epic for further details.  Past Medical History: Past Medical History:  Diagnosis Date  . Asthma, exercise induced   . Difficulty hearing   . History of parathyroid disease   . HTN (hypertension)   . Macular degeneration   . Migraine headache without aura   . OP (osteoporosis)   . Problems related to lack of adequate sleep    Past Surgical History: Past Surgical History:  Procedure Laterality Date  . ABDOMINAL HYSTERECTOMY     she thinks ovaries removed as well.    Marland Kitchen Anterior acromionectomy  07/31/00   left  . BREAST LUMPECTOMY  1990  . CATARACT EXTRACTION  [id number]  . PARATHYROIDECTOMY  09/06/04   Minimally invasive parathyroid surgery by Dr. Charise Killian in [place], [place].  . repair of torn rotator cuff  07/31/00   left, and 2007  . TONSILLECTOMY     Social History: Social History   Socioeconomic History  . Marital status: Married    Spouse name: [name]  . Number of children: 2  . Years of education: 16  . Highest education level: Bachelor's degree (e.g., BA, AB, BS)  Occupational History  . 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 Financial resource strain: Not hard at all  . Food insecurity:    Worry: Never true    Inability: Never true  . Transportation needs:    Medical:  No    Non-medical: No  Tobacco Use  . Smoking status: Never Smoker  . Smokeless tobacco: Never Used  Substance and Sexual Activity  . Alcohol use: No  . Drug use: No  . Sexual activity: Yes    Partners: Male  Lifestyle  . Physical activity:    Days per week: 0 days    Minutes per session: 0 min  . Stress: To some extent  Relationships  . Social connections:    Talks on phone: More than three times a week    Gets together: Twice a week    Attends religious service: More than 4 times per year    Active member of club or organization: No    Attends meetings of clubs or organizations: Never    Relationship status: Married  Other Topics Concern  . Not on file  Social History Narrative   At 4 years of college. No regular exercise. No significant caffeine intake.Doesn't like to drive as much as she used to.   Family History: Family History  Problem Relation Age of Onset  . Heart disease Father    Allergies: Allergies  Allergen Reactions  . Erythromycin Swelling  . Penicillins Swelling, Rash and Hives   Medications: See med rec.  Review of Systems: No fevers, chills, night sweats, weight loss, chest pain, or shortness of breath.  Objective:    General: Well Developed, well nourished, and in no acute distress.  Neuro: Alert and oriented x3, extra-ocular muscles intact, sensation grossly intact.  HEENT: Normocephalic, atraumatic, pupils equal round reactive to light, neck supple, no masses, no lymphadenopathy, thyroid nonpalpable.  Skin: Warm and dry, no rashes. Cardiac: Regular rate and rhythm, no murmurs rubs or gallops, no lower extremity edema.  Respiratory: Clear to auscultation bilaterally. Not using accessory muscles, speaking in full sentences. Left knee: Large visible and palpable Baker's cyst Palpation normal with no warmth or joint line tenderness or patellar tenderness or condyle tenderness. ROM normal in flexion and extension and lower leg  rotation. Ligaments with solid consistent endpoints including ACL, PCL, LCL, MCL. Negative Mcmurray's and provocative meniscal tests. Non painful patellar compression. Patellar and quadriceps tendons unremarkable. Hamstring and quadriceps strength is normal. Right shin: There is a tender palpable foreign body in the subcutaneous tissues.  Procedure: Real-time Ultrasound Guided aspiration of left knee Baker's cyst Device: GE Logiq E  Verbal informed consent obtained.  Time-out conducted.  Noted no overlying erythema, induration, or other signs of local infection.  Skin prepped in a sterile fashion.  Local anesthesia: Topical Ethyl chloride.  With sterile technique and under real time ultrasound guidance: Using 18-gauge needle aspirated 56 cc of clear, straw-colored fluid. Completed without difficulty  Pain immediately resolved suggesting accurate placement of the medication.  Advised to call if fevers/chills, erythema, induration, drainage, or persistent bleeding.  Images permanently stored and available for review in the ultrasound unit.  Impression: Technically successful ultrasound guided injection.  Impression and Recommendations:    Foreign body of leg, right, sequela X-rays of the right tib-fib, she endorses a piece of gravel hit her years ago and has been retained since, she has mild tenderness. She also has some burning from her hip and little bit on the anterolateral lower leg, I do think some of this may be radicular, she does have the option of calling me and I can add gabapentin if desired.  Baker's cyst of knee, left Aspiration of Baker cyst. If recurrence we will inject the knee joint and reaspirated. ___________________________________________ Gwen Her. Dianah Field, M.D., ABFM., CAQSM. Primary Care and Sports Medicine [facility]  Adjunct Professor of [employer]

## 2018-09-17 NOTE — Assessment & Plan Note (Addendum)
Aspiration of Baker cyst. If recurrence we will inject the knee joint and reaspirated.

## 2018-10-15 ENCOUNTER — Ambulatory Visit: Payer: Medicare HMO | Admitting: Sports Medicine

## 2018-12-21 IMAGING — DX DG LUMBAR SPINE COMPLETE 4+V
5 series · 5 of 5 positions shown · non-contrast
Comparison: AP and lateral views of the lumbar spine October 10, 2013

CLINICAL DATA: Left lower back pain radiating into the left hip for
the past month without known injury

EXAM:
LUMBAR SPINE - COMPLETE 4+ VIEW

[l-spine ap]
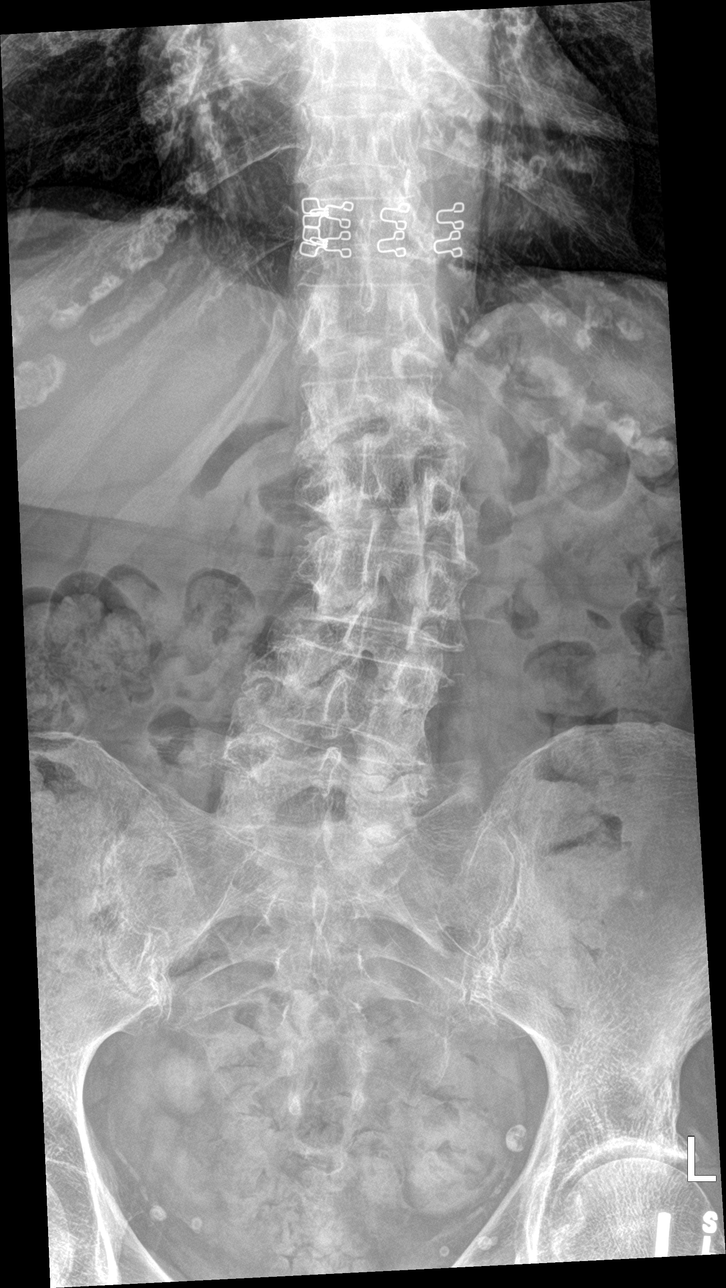

[l-spine obl (1 of 2)]
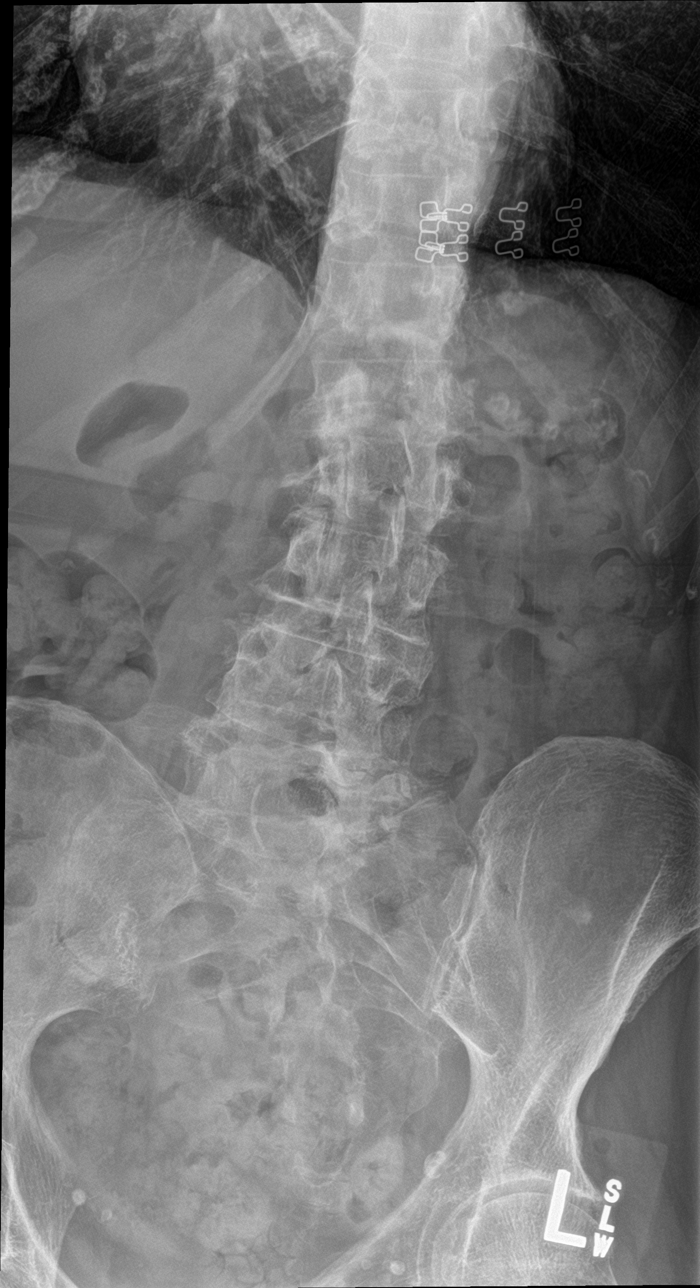

[l-spine obl (2 of 2)]
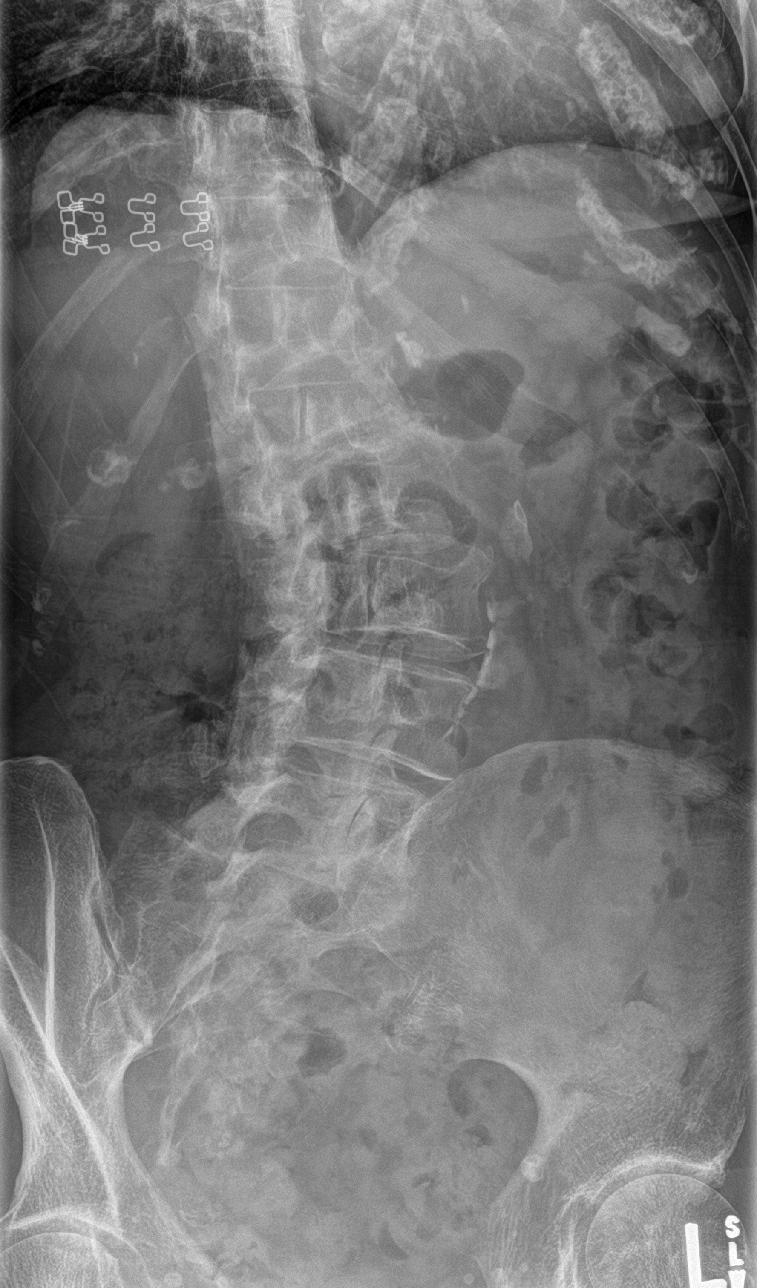

[l-spine lat]
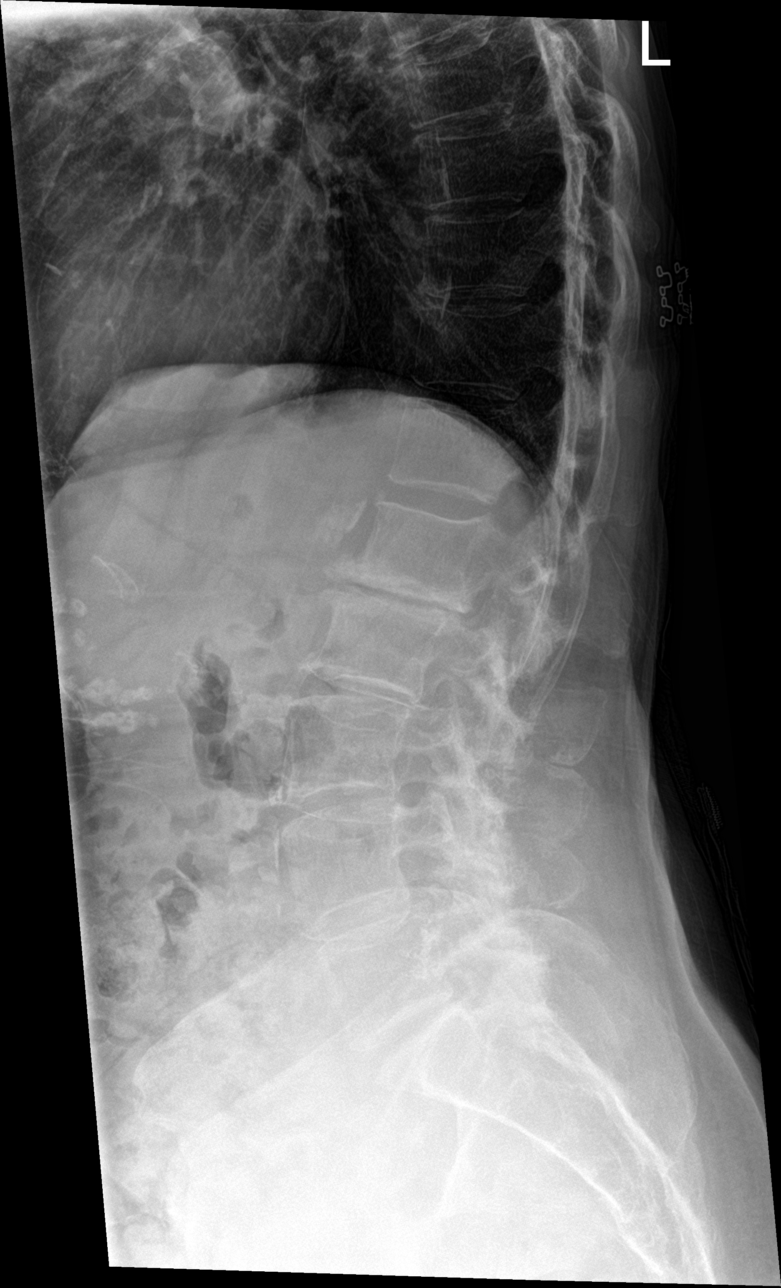

[l-spine spot]
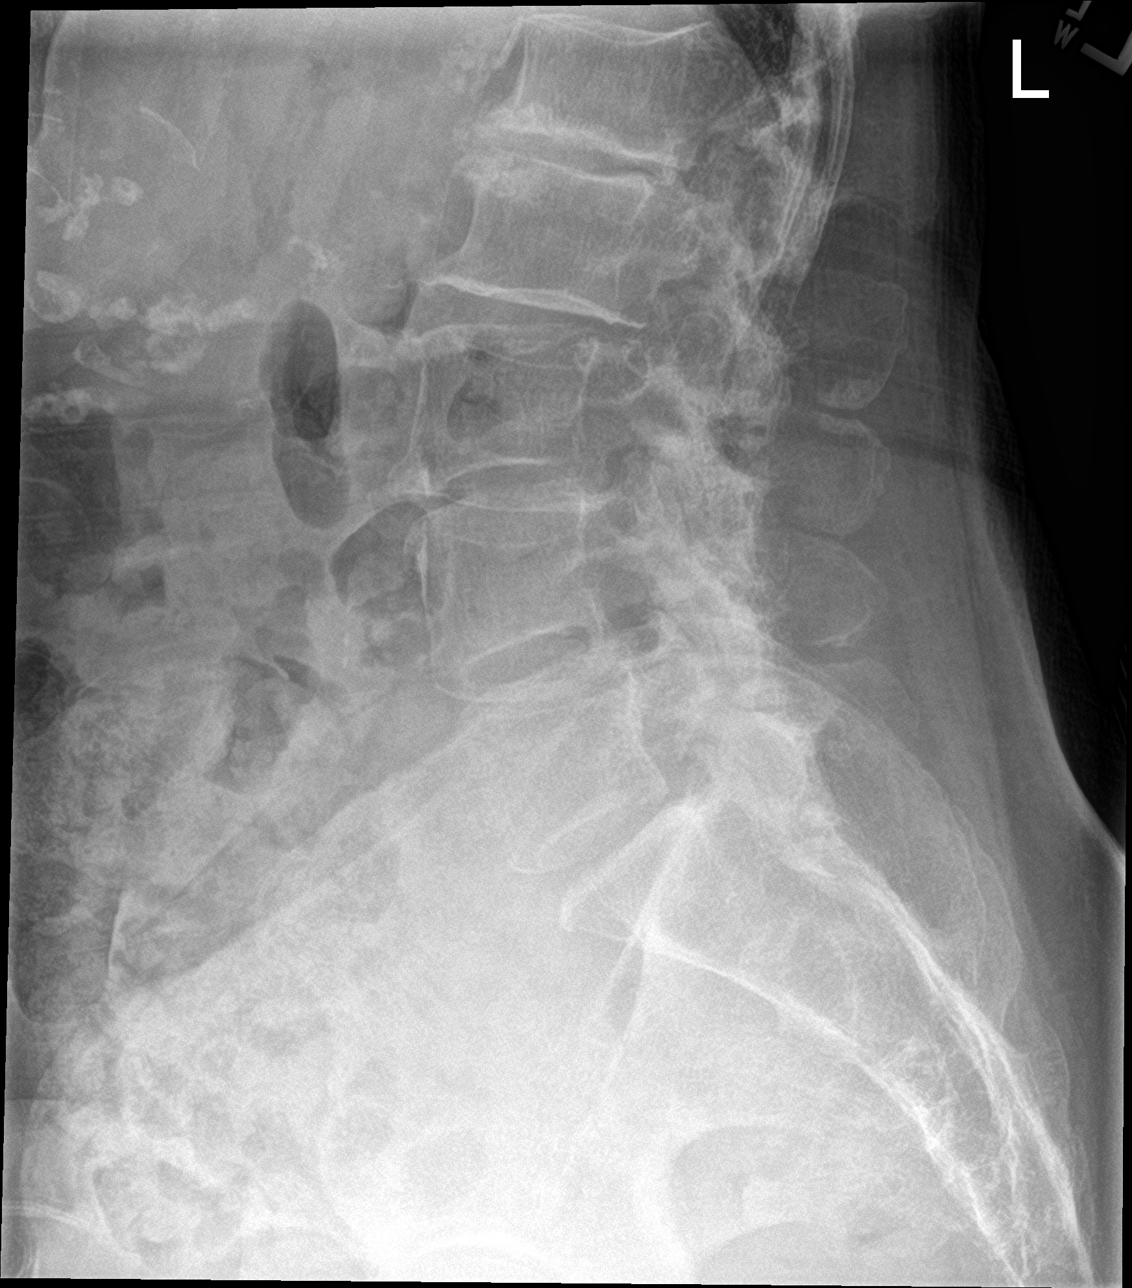

[5 of 5 positions shown; findings below may reference images not displayed]

FINDINGS: There is chronic curvature of the lumbar spine centered at L2-3
convex toward the left. There is degenerative disc space narrowing
at L1-2 which has progressed. There is grade 1 anterolisthesis of L2
with respect to L1 which is new. There is mild disc space narrowing
at the other lumbar levels which is fairly stable. There is
multilevel facet joint hypertrophy. The pedicles and transverse
processes are intact where visualized. There is calcification in the
wall of the abdominal aorta.
IMPRESSION: Progressive degenerative disc space loss and endplate eburnation at
L1-2. New grade 1 anterolisthesis of L2 with respect to L1. No
compression fracture.

Mild degenerative disc space narrowing at other levels, stable.
Moderate multilevel degenerative facet joint change.

Abdominal aortic atherosclerosis.

## 2018-12-28 ENCOUNTER — Other Ambulatory Visit: Payer: Self-pay | Admitting: Family Medicine

## 2019-01-01 ENCOUNTER — Ambulatory Visit (INDEPENDENT_AMBULATORY_CARE_PROVIDER_SITE_OTHER): Payer: Medicare HMO | Admitting: Family Medicine

## 2019-01-01 ENCOUNTER — Encounter: Payer: Self-pay | Admitting: Family Medicine

## 2019-01-01 VITALS — BP 134/77 | Temp 97.2°F | Ht 64.0 in | Wt 127.5 lb

## 2019-01-01 DIAGNOSIS — G479 Sleep disorder, unspecified: Secondary | ICD-10-CM

## 2019-01-01 DIAGNOSIS — J449 Chronic obstructive pulmonary disease, unspecified: Secondary | ICD-10-CM

## 2019-01-01 DIAGNOSIS — J45990 Exercise induced bronchospasm: Secondary | ICD-10-CM

## 2019-01-01 DIAGNOSIS — I10 Essential (primary) hypertension: Secondary | ICD-10-CM | POA: Diagnosis not present

## 2019-01-01 MED ORDER — ALBUTEROL SULFATE HFA 108 (90 BASE) MCG/ACT IN AERS: INHALATION_SPRAY | RESPIRATORY_TRACT | 1 refills | Status: DC

## 2019-01-01 MED ORDER — UMECLIDINIUM-VILANTEROL 62.5-25 MCG/INH IN AEPB: INHALATION_SPRAY | RESPIRATORY_TRACT | 5 refills | Status: DC

## 2019-01-01 MED ORDER — AMLODIPINE BESYLATE 5 MG PO TABS: 5.0000 mg | ORAL_TABLET | Freq: Every day | ORAL | 1 refills | Status: DC

## 2019-01-01 MED ORDER — LOSARTAN POTASSIUM 100 MG PO TABS: 100.0000 mg | ORAL_TABLET | Freq: Every day | ORAL | 1 refills | Status: DC

## 2019-01-01 NOTE — Progress Notes (Signed)
11/2018  

## 2019-01-01 NOTE — Progress Notes (Signed)
Virtual Visit via Video Note  I connected with [name] on 01/01/19 at  8:50 AM EDT by a video enabled telemedicine application and verified that I am speaking with the correct person using two identifiers.   I discussed the limitations of evaluation and management by telemedicine and the availability of in person appointments. The patient expressed understanding and agreed to proceed.  Pt was at home and I was in my office for the virtual visit.     Subjective:    CC: 6 mo f/u   HPI:  Hypertension- Pt denies chest pain, SOB, dizziness, or heart palpitations.  Taking meds as directed w/o problems.  Denies medication side effects.  No swelling.   F/U COPD/asthma -currently on Anoro daily. Has not needed use her rescue inhaler at lall.   She is using Hemp Oil x 2 mo for sleep. 1 drop at bedtime and she feels it is helping. She feels like she is resting better.   She is also using Curamin for joint pain. Started about a month ago and started when she started yard work. She is not taking any ASA now. She feels it is helping.   Past medical history, Surgical history, Family history not pertinant except as noted below, Social history, Allergies, and medications have been entered into the medical record, reviewed, and corrections made.   Review of Systems: No fevers, chills, night sweats, weight loss, chest pain, or shortness of breath.   Objective:    General: Speaking clearly in complete sentences without any shortness of breath.  Alert and oriented x3.  Normal judgment. No apparent acute distress.    Impression and Recommendations:    HTN -Home BP at goal.  Continue with amlodipine and losartan.  Due for BMP.  COPD - Stable.  No recent flares or exacerbations in fact she rarely has had to use her rescue inhaler which is fantastic and says it is still up-to-date but does need a new prescription sent for Anoro.  OA -okay to continue with turmeric.  Sleep disturbance-currently  using hemp oil.  She says she feels well and she is not currently having any side effects which is also very reassuring.      I discussed the assessment and treatment plan with the patient. The patient was provided an opportunity to ask questions and all were answered. The patient agreed with the plan and demonstrated an understanding of the instructions.   The patient was advised to call back or seek an in-person evaluation if the symptoms worsen or if the condition fails to improve as anticipated.   Beatrice Lecher, MD

## 2019-01-02 LAB — BASIC METABOLIC PANEL WITH GFR
BUN: 20 mg/dL (ref 7–25)
CO2: 29 mmol/L (ref 20–32)
Calcium: 10.1 mg/dL (ref 8.6–10.4)
Chloride: 106 mmol/L (ref 98–110)
Creat: 0.86 mg/dL (ref 0.60–0.88)
GFR, Est African American: 72 mL/min/{1.73_m2} (ref >=60)
GFR, Est Non African American: 62 mL/min/{1.73_m2} (ref >=60)
Glucose, Bld: 93 mg/dL (ref 65–99)
Potassium: 4.4 mmol/L (ref 3.5–5.3)
Sodium: 141 mmol/L (ref 135–146)

## 2019-01-02 NOTE — Progress Notes (Signed)
All labs are normal. 

## 2019-01-15 DIAGNOSIS — H353131 Nonexudative age-related macular degeneration, bilateral, early dry stage: Secondary | ICD-10-CM | POA: Diagnosis not present

## 2019-01-15 DIAGNOSIS — D3132 Benign neoplasm of left choroid: Secondary | ICD-10-CM | POA: Diagnosis not present

## 2019-01-15 DIAGNOSIS — H3552 Pigmentary retinal dystrophy: Secondary | ICD-10-CM | POA: Diagnosis not present

## 2019-01-15 DIAGNOSIS — H353211 Exudative age-related macular degeneration, right eye, with active choroidal neovascularization: Secondary | ICD-10-CM | POA: Diagnosis not present

## 2019-01-15 DIAGNOSIS — H43813 Vitreous degeneration, bilateral: Secondary | ICD-10-CM | POA: Diagnosis not present

## 2019-02-06 DIAGNOSIS — Z85828 Personal history of other malignant neoplasm of skin: Secondary | ICD-10-CM | POA: Diagnosis not present

## 2019-02-06 DIAGNOSIS — D1721 Benign lipomatous neoplasm of skin and subcutaneous tissue of right arm: Secondary | ICD-10-CM | POA: Diagnosis not present

## 2019-02-06 DIAGNOSIS — R238 Other skin changes: Secondary | ICD-10-CM | POA: Diagnosis not present

## 2019-02-06 DIAGNOSIS — D225 Melanocytic nevi of trunk: Secondary | ICD-10-CM | POA: Diagnosis not present

## 2019-02-06 DIAGNOSIS — L570 Actinic keratosis: Secondary | ICD-10-CM | POA: Diagnosis not present

## 2019-02-06 DIAGNOSIS — L821 Other seborrheic keratosis: Secondary | ICD-10-CM | POA: Diagnosis not present

## 2019-02-06 DIAGNOSIS — Z86018 Personal history of other benign neoplasm: Secondary | ICD-10-CM | POA: Diagnosis not present

## 2019-02-15 DIAGNOSIS — H3552 Pigmentary retinal dystrophy: Secondary | ICD-10-CM | POA: Diagnosis not present

## 2019-02-15 DIAGNOSIS — D3132 Benign neoplasm of left choroid: Secondary | ICD-10-CM | POA: Diagnosis not present

## 2019-02-15 DIAGNOSIS — H353211 Exudative age-related macular degeneration, right eye, with active choroidal neovascularization: Secondary | ICD-10-CM | POA: Diagnosis not present

## 2019-02-15 DIAGNOSIS — H43813 Vitreous degeneration, bilateral: Secondary | ICD-10-CM | POA: Diagnosis not present

## 2019-02-20 ENCOUNTER — Ambulatory Visit (INDEPENDENT_AMBULATORY_CARE_PROVIDER_SITE_OTHER): Payer: Medicare HMO | Admitting: Sports Medicine

## 2019-02-20 ENCOUNTER — Other Ambulatory Visit: Payer: Self-pay

## 2019-02-20 DIAGNOSIS — M7122 Synovial cyst of popliteal space [Baker], left knee: Secondary | ICD-10-CM

## 2019-02-20 NOTE — Assessment & Plan Note (Signed)
Repeat aspiration, previously done on February 17, return as needed.

## 2019-02-20 NOTE — Progress Notes (Signed)
Subjective:    CC: Left knee swelling  HPI: This is a very pleasant 85 year old female, we aspirated the Baker's cyst back on February 17, she did extremely well until recently, overdid it in her garden, and now has a recurrence of painful swelling on the back of her left knee, moderate, persistent, localized without radiation.  I reviewed the past medical history, family history, social history, surgical history, and allergies today and no changes were needed.  Please see the problem list section below in epic for further details.  Past Medical History: Past Medical History:  Diagnosis Date  . Asthma, exercise induced   . Difficulty hearing   . History of parathyroid disease   . HTN (hypertension)   . Macular degeneration   . Migraine headache without aura   . OP (osteoporosis)   . Problems related to lack of adequate sleep    Past Surgical History: Past Surgical History:  Procedure Laterality Date  . ABDOMINAL HYSTERECTOMY     she thinks ovaries removed as well.    Marland Kitchen Anterior acromionectomy  07/31/00   left  . BREAST LUMPECTOMY  1990  . CATARACT EXTRACTION  [id number]  . PARATHYROIDECTOMY  09/06/04   Minimally invasive parathyroid surgery by Dr. Charise Killian in [place], [place].  . repair of torn rotator cuff  07/31/00   left, and 2007  . TONSILLECTOMY     Social History: Social History   Socioeconomic History  . Marital status: Married    Spouse name: [name]  . Number of children: 2  . Years of education: 16  . Highest education level: Bachelor's degree (e.g., BA, AB, BS)  Occupational History  . 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 Financial resource strain: Not hard at all  . Food insecurity    Worry: Never true    Inability: Never true  . Transportation needs    Medical: No    Non-medical: No  Tobacco Use  . Smoking status: Never Smoker  . Smokeless tobacco: Never Used  Substance and Sexual Activity  .  Alcohol use: No  . Drug use: No  . Sexual activity: Yes    Partners: Male  Lifestyle  . Physical activity    Days per week: 0 days    Minutes per session: 0 min  . Stress: To some extent  Relationships  . Social connections    Talks on phone: More than three times a week    Gets together: Twice a week    Attends religious service: More than 4 times per year    Active member of club or organization: No    Attends meetings of clubs or organizations: Never    Relationship status: Married  Other Topics Concern  . Not on file  Social History Narrative   At 4 years of college. No regular exercise. No significant caffeine intake.Doesn't like to drive as much as she used to.   Family History: Family History  Problem Relation Age of Onset  . Heart disease Father    Allergies: Allergies  Allergen Reactions  . Erythromycin Swelling  . Penicillins Swelling, Rash and Hives   Medications: See med rec.  Review of Systems: No fevers, chills, night sweats, weight loss, chest pain, or shortness of breath.   Objective:    General: Well Developed, well nourished, and in no acute distress.  Neuro: Alert and oriented x3, extra-ocular muscles intact, sensation grossly intact.  HEENT: Normocephalic,  atraumatic, pupils equal round reactive to light, neck supple, no masses, no lymphadenopathy, thyroid nonpalpable.  Skin: Warm and dry, no rashes. Cardiac: Regular rate and rhythm, no murmurs rubs or gallops, no lower extremity edema.  Respiratory: Clear to auscultation bilaterally. Not using accessory muscles, speaking in full sentences. Left knee: Visibly swollen with a palpable Baker's cyst, tense. Palpation normal with no warmth or joint line tenderness or patellar tenderness or condyle tenderness. ROM normal in flexion and extension and lower leg rotation. Ligaments with solid consistent endpoints including ACL, PCL, LCL, MCL. Negative Mcmurray's and provocative meniscal tests. Non painful  patellar compression. Patellar and quadriceps tendons unremarkable. Hamstring and quadriceps strength is normal.  Procedure: Real-time Ultrasound Guided  aspiration/injection of left knee Baker cyst Device: GE Logiq E  Verbal informed consent obtained.  Time-out conducted.  Noted no overlying erythema, induration, or other signs of local infection.  Skin prepped in a sterile fashion.  Local anesthesia: Topical Ethyl chloride.  With sterile technique and under real time ultrasound guidance:  Noted a multi septated Baker's cyst in the back of the left knee, I advanced an 18-gauge needle and aspirated each and every 1 of the septations for a total of 47 cc of clear, straw-colored fluid, syringe switched and 1 cc Kenalog 40, 1 cc lidocaine injected easily. Completed without difficulty  Pain immediately resolved suggesting accurate placement of the medication.  Advised to call if fevers/chills, erythema, induration, drainage, or persistent bleeding.  Images permanently stored and available for review in the ultrasound unit.  Impression: Technically successful ultrasound guided injection.  Impression and Recommendations:    Baker's cyst of knee, left Repeat aspiration, previously done on February 17, return as needed.   ___________________________________________ Gwen Her. Dianah Field, M.D., ABFM., CAQSM. Primary Care and Sports Medicine [facility]  Adjunct Professor of [employer]

## 2019-03-20 DIAGNOSIS — H3552 Pigmentary retinal dystrophy: Secondary | ICD-10-CM | POA: Diagnosis not present

## 2019-03-20 DIAGNOSIS — H43813 Vitreous degeneration, bilateral: Secondary | ICD-10-CM | POA: Diagnosis not present

## 2019-03-20 DIAGNOSIS — H353211 Exudative age-related macular degeneration, right eye, with active choroidal neovascularization: Secondary | ICD-10-CM | POA: Diagnosis not present

## 2019-03-20 DIAGNOSIS — D3132 Benign neoplasm of left choroid: Secondary | ICD-10-CM | POA: Diagnosis not present

## 2019-04-19 DIAGNOSIS — H3552 Pigmentary retinal dystrophy: Secondary | ICD-10-CM | POA: Diagnosis not present

## 2019-04-19 DIAGNOSIS — H353211 Exudative age-related macular degeneration, right eye, with active choroidal neovascularization: Secondary | ICD-10-CM | POA: Diagnosis not present

## 2019-04-19 DIAGNOSIS — H43813 Vitreous degeneration, bilateral: Secondary | ICD-10-CM | POA: Diagnosis not present

## 2019-04-19 DIAGNOSIS — H35371 Puckering of macula, right eye: Secondary | ICD-10-CM | POA: Diagnosis not present

## 2019-05-07 DIAGNOSIS — R69 Illness, unspecified: Secondary | ICD-10-CM | POA: Diagnosis not present

## 2019-05-13 ENCOUNTER — Telehealth: Payer: Self-pay | Admitting: Family Medicine

## 2019-05-13 NOTE — Telephone Encounter (Signed)
Patient called, wanting to discuss an Inhaler that she received samples of last year and said it worked really well, and would possibly like to get another sample of. Also wanted to discuss her husbands [employer] letter that was left in office. Please Advise.

## 2019-05-14 NOTE — Telephone Encounter (Signed)
Sent request for Anoro samples to [place] to check on this for the patient.  Called pt and spoke w/her husband [name]) and advised him the letter was placed in Dr. Gardiner Ramus box for review. I also advised him that [employer] testing in more stringent than the office however, once she has reviewed the results if she has a different recommendation we will call and advise him at that time. He was agreeable to this. I asked that he inform his wife that I inquired about trying to get samples for her and we would contact her if these are available. He will let her know.Maryruth Eve, Lahoma Crocker, CMA

## 2019-05-16 NOTE — Telephone Encounter (Signed)
Drug rep Jolayne Haines dropped off 5 boxes of Anoro for this patient.   Expiration: 09/2020 Lot: [id number]  Patient will pick up later this afternoon to pick up. [place] office aware.

## 2019-05-18 DIAGNOSIS — Z23 Encounter for immunization: Secondary | ICD-10-CM | POA: Diagnosis not present

## 2019-05-31 DIAGNOSIS — H35371 Puckering of macula, right eye: Secondary | ICD-10-CM | POA: Diagnosis not present

## 2019-05-31 DIAGNOSIS — H3552 Pigmentary retinal dystrophy: Secondary | ICD-10-CM | POA: Diagnosis not present

## 2019-05-31 DIAGNOSIS — D3132 Benign neoplasm of left choroid: Secondary | ICD-10-CM | POA: Diagnosis not present

## 2019-05-31 DIAGNOSIS — H353211 Exudative age-related macular degeneration, right eye, with active choroidal neovascularization: Secondary | ICD-10-CM | POA: Diagnosis not present

## 2019-07-03 ENCOUNTER — Ambulatory Visit: Payer: Medicare HMO | Admitting: Family Medicine

## 2019-07-08 ENCOUNTER — Ambulatory Visit (INDEPENDENT_AMBULATORY_CARE_PROVIDER_SITE_OTHER): Payer: Medicare HMO | Admitting: Family Medicine

## 2019-07-08 ENCOUNTER — Encounter: Payer: Self-pay | Admitting: Family Medicine

## 2019-07-08 ENCOUNTER — Other Ambulatory Visit: Payer: Self-pay

## 2019-07-08 VITALS — BP 139/71 | HR 65 | Ht 64.0 in | Wt 130.0 lb

## 2019-07-08 DIAGNOSIS — I10 Essential (primary) hypertension: Secondary | ICD-10-CM | POA: Diagnosis not present

## 2019-07-08 DIAGNOSIS — R351 Nocturia: Secondary | ICD-10-CM

## 2019-07-08 DIAGNOSIS — J449 Chronic obstructive pulmonary disease, unspecified: Secondary | ICD-10-CM | POA: Diagnosis not present

## 2019-07-08 DIAGNOSIS — M7122 Synovial cyst of popliteal space [Baker], left knee: Secondary | ICD-10-CM

## 2019-07-08 DIAGNOSIS — I700 Atherosclerosis of aorta: Secondary | ICD-10-CM

## 2019-07-08 DIAGNOSIS — M8000XA Age-related osteoporosis with current pathological fracture, unspecified site, initial encounter for fracture: Secondary | ICD-10-CM | POA: Diagnosis not present

## 2019-07-08 MED ORDER — LOSARTAN POTASSIUM 100 MG PO TABS: 100.0000 mg | ORAL_TABLET | Freq: Every day | ORAL | 1 refills | Status: DC

## 2019-07-08 MED ORDER — AMLODIPINE BESYLATE 5 MG PO TABS: 5.0000 mg | ORAL_TABLET | Freq: Every day | ORAL | 1 refills | Status: DC

## 2019-07-08 NOTE — Assessment & Plan Note (Signed)
No recent chest pain.  Asymptomatic.

## 2019-07-08 NOTE — Addendum Note (Signed)
Addended by: Teddy Spike on: 07/08/2019 12:05 PM   Modules accepted: Orders

## 2019-07-08 NOTE — Assessment & Plan Note (Signed)
Home blood pressures look great.  Continue to monitor periodically.  Follow-up in 6 months.  Due for updated lab work today.

## 2019-07-08 NOTE — Assessment & Plan Note (Addendum)
Stable.  She has had to use albuterol about 5 times in the last 6 months which is actually pretty good.  Continue current regimen with Anoro.  Flu vaccine up-to-date.

## 2019-07-08 NOTE — Progress Notes (Signed)
Established Patient Office Visit  Subjective:  Patient ID: [name], female    DOB: October 29, 1932  Age: 85 y.o. MRN: [id number]  CC:  Chief Complaint  Patient presents with  . Hypertension    HPI [name] presents for   Hypertension- Pt denies chest pain, SOB, dizziness, or heart palpitations.  Taking meds as directed w/o problems.  Denies medication side effects.  He did bring in home blood pressure log and most of the blood pressures look fantastic mostly running in the 120s over 70s with a pulse between 50 and 70.  Does note when her blood pressures run a little higher she is usually stressed.  F/U COPD -she says since she was last here she is only had to use her albuterol about 4-5 times total over the last 6 months.  She says it is been a very limited use if she uses it it seems to provide relief.  She is using her Norco daily and still has about a weeks worth she wonders if we could would be able to get her any samples as it is quite expensive with her health insurance.  She has a Baker's cyst and has been following with Dr. Dianah Field for this.  In fact she actually had aspirated in July.  She had another flare in October but was able to just use rice therapy and start wearing a knee sleeve and use some MSM supplementation and that seemed to calm it down.  She wants to know what the next steps would be though to possibly have something permanently done such as surgery.  She says sometimes it bothers her at night.  Has not been sleeping well.  In fact she has been waking up and then cannot get back to sleep.  Often it is to urinate.  Past Medical History:  Diagnosis Date  . Asthma, exercise induced   . Difficulty hearing   . History of parathyroid disease   . HTN (hypertension)   . Macular degeneration   . Migraine headache without aura   . OP (osteoporosis)   . Problems related to lack of adequate sleep     Past Surgical History:  Procedure Laterality Date  .  ABDOMINAL HYSTERECTOMY     she thinks ovaries removed as well.    Marland Kitchen Anterior acromionectomy  07/31/00   left  . BREAST LUMPECTOMY  1990  . CATARACT EXTRACTION  [id number]  . PARATHYROIDECTOMY  09/06/04   Minimally invasive parathyroid surgery by Dr. Charise Killian in [place], [place].  . repair of torn rotator cuff  07/31/00   left, and 2007  . TONSILLECTOMY      Family History  Problem Relation Age of Onset  . Heart disease Father     Social History   Socioeconomic History  . Marital status: Married    Spouse name: [name]  . Number of children: 2  . Years of education: 16  . Highest education level: Bachelor's degree (e.g., BA, AB, BS)  Occupational History  . 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 Financial resource strain: Not hard at all  . Food insecurity    Worry: Never true    Inability: Never true  . Transportation needs    Medical: No    Non-medical: No  Tobacco Use  . Smoking status: Never Smoker  . Smokeless tobacco: Never Used  Substance and Sexual Activity  . Alcohol use: No  .  Drug use: No  . Sexual activity: Yes    Partners: Male  Lifestyle  . Physical activity    Days per week: 0 days    Minutes per session: 0 min  . Stress: To some extent  Relationships  . Social connections    Talks on phone: More than three times a week    Gets together: Twice a week    Attends religious service: More than 4 times per year    Active member of club or organization: No    Attends meetings of clubs or organizations: Never    Relationship status: Married  . Intimate partner violence    Fear of current or ex partner: No    Emotionally abused: No    Physically abused: No    Forced sexual activity: No  Other Topics Concern  . Not on file  Social History Narrative   At 4 years of college. No regular exercise. No significant caffeine intake.Doesn't like to drive as much as she used to.    Outpatient Medications Prior  to Visit  Medication Sig Dispense Refill  . albuterol (PROAIR HFA) 108 (90 Base) MCG/ACT inhaler inhale 2 puffs by mouth every 6 hours if needed 25.5 Inhaler 1  . AMBULATORY NON FORMULARY MEDICATION Take 1 Scoop by mouth at bedtime as needed. Medication Name: Thomas Hoff herbs 1 cap nightly as needed    . aspirin 81 MG chewable tablet Chew 81 mg by mouth daily.    . B Complex Vitamins (VITAMIN B COMPLEX PO) Take 1 tablet by mouth every other day.    . Calcium Citrate-Vitamin D (CALCIUM CITRATE + D PO) Take 1 tablet by mouth 2 (two) times daily.    . Cholecalciferol (VITAMIN D3) 1000 units CAPS Take 1 capsule by mouth daily.    . Magnesium 200 MG TABS Take 4 tablets by mouth daily.    . Melatonin 10 MG CAPS Take 1 capsule by mouth as needed.    Marland Kitchen Methylsulfonylmethane (MSM) POWD Take 2 Scoops by mouth 2 (two) times daily as needed. 2 teaspoons BID PRN    . Multiple Vitamins-Minerals (PRESERVISION AREDS 2) CAPS Take 1 tablet by mouth 2 (two) times daily.    . NON FORMULARY 0.75 mLs. Hemp oil .75 ml    . NON FORMULARY Take 3 tablets by mouth daily. curamin for pain    . umeclidinium-vilanterol (ANORO ELLIPTA) 62.5-25 MCG/INH AEPB INHALE 1 PUFF ONCE DAILY 60 each 5  . vitamin C (ASCORBIC ACID) 500 MG tablet Take 500 mg by mouth daily.    Marland Kitchen amLODipine (NORVASC) 5 MG tablet Take 1 tablet (5 mg total) by mouth daily. 90 tablet 1  . losartan (COZAAR) 100 MG tablet Take 1 tablet (100 mg total) by mouth daily. 90 tablet 1  . TURMERIC PO Take 1 capsule by mouth 3 (three) times daily.     No facility-administered medications prior to visit.     Allergies  Allergen Reactions  . Erythromycin Swelling  . Penicillins Swelling, Rash and Hives    ROS Review of Systems    Objective:    Physical Exam  Constitutional: She is oriented to person, place, and time. She appears well-developed and well-nourished.  HENT:  Head: Normocephalic and atraumatic.  Cardiovascular: Normal rate, regular rhythm and  normal heart sounds.  Pulse 2+ bilaterally.  Pulmonary/Chest: Effort normal and breath sounds normal.  Neurological: She is alert and oriented to person, place, and time.  Skin: Skin is warm and dry.  Psychiatric: She has a normal mood and affect. Her behavior is normal.    BP 139/71   Pulse 65   Ht 5\' 4"  (1.626 m)   Wt 130 lb (59 kg)   SpO2 100%   BMI 22.31 kg/m  Wt Readings from Last 3 Encounters:  07/08/19 130 lb (59 kg)  01/01/19 127 lb 8 oz (57.8 kg)  09/17/18 132 lb (59.9 kg)     There are no preventive care reminders to display for this patient.  There are no preventive care reminders to display for this patient.  Lab Results  Component Value Date   TSH 1.520 07/01/2014   Lab Results  Component Value Date   WBC 5.8 06/15/2012   HGB 14.4 06/15/2012   HCT 43.1 06/15/2012   MCV 88.5 06/15/2012   PLT 258 06/15/2012   Lab Results  Component Value Date   NA 141 01/01/2019   K 4.4 01/01/2019   CO2 29 01/01/2019   GLUCOSE 93 01/01/2019   BUN 20 01/01/2019   CREATININE 0.86 01/01/2019   BILITOT 0.6 07/02/2018   ALKPHOS 79 07/13/2016   AST 20 07/02/2018   ALT 17 07/02/2018   PROT 6.7 07/02/2018   ALBUMIN 4.2 07/13/2016   CALCIUM 10.1 01/01/2019   Lab Results  Component Value Date   CHOL 204 (H) 07/02/2018   Lab Results  Component Value Date   HDL 70 07/02/2018   Lab Results  Component Value Date   LDLCALC 117 (H) 07/02/2018   Lab Results  Component Value Date   TRIG 78 07/02/2018   Lab Results  Component Value Date   CHOLHDL 2.9 07/02/2018   No results found for: HGBA1C    Assessment & Plan:   Problem List Items Addressed This Visit      Cardiovascular and Mediastinum   Essential hypertension - Primary    Home blood pressures look great.  Continue to monitor periodically.  Follow-up in 6 months.  Due for updated lab work today.      Relevant Medications   aspirin 81 MG chewable tablet   amLODipine (NORVASC) 5 MG tablet   losartan  (COZAAR) 100 MG tablet   Other Relevant Orders   COMPLETE METABOLIC PANEL WITH GFR   Lipid panel   Urinalysis, Routine w reflex microscopic   Aortic atherosclerosis (HCC)    No recent chest pain.  Asymptomatic.      Relevant Medications   aspirin 81 MG chewable tablet   amLODipine (NORVASC) 5 MG tablet   losartan (COZAAR) 100 MG tablet     Respiratory   COPD (chronic obstructive pulmonary disease) (HCC)    Stable.  She has had to use albuterol about 5 times in the last 6 months which is actually pretty good.  Continue current regimen with Anoro.  Flu vaccine up-to-date.        Musculoskeletal and Integument   Baker's cyst of knee, left    Discussed that if for now she is doing well with conservative therapies to just continue with that for the next 6 months.  Certainly if it becomes more bothersome or more frequent or more painful may want a get back with Dr. Dianah Field to discuss referral to an orthopedic surgeon.       Other Visit Diagnoses    Nocturia       Relevant Orders   Urinalysis, Routine w reflex microscopic     Nocturia-likely related to overactive bladder but will check a urinalysis today.  It is  making it more difficult for her to stay asleep she is currently using melatonin.  Meds ordered this encounter  Medications  . amLODipine (NORVASC) 5 MG tablet    Sig: Take 1 tablet (5 mg total) by mouth daily.    Dispense:  90 tablet    Refill:  1  . losartan (COZAAR) 100 MG tablet    Sig: Take 1 tablet (100 mg total) by mouth daily.    Dispense:  90 tablet    Refill:  1    Follow-up: Return in about 6 months (around 01/06/2020) for Hypertension.    Beatrice Lecher, MD

## 2019-07-08 NOTE — Assessment & Plan Note (Signed)
Discussed that if for now she is doing well with conservative therapies to just continue with that for the next 6 months.  Certainly if it becomes more bothersome or more frequent or more painful may want a get back with Dr. Dianah Field to discuss referral to an orthopedic surgeon.

## 2019-07-09 LAB — COMPLETE METABOLIC PANEL WITH GFR
AG Ratio: 1.9 (calc) (ref 1.0–2.5)
ALT: 19 U/L (ref 6–29)
AST: 22 U/L (ref 10–35)
Albumin: 4.5 g/dL (ref 3.6–5.1)
Alkaline phosphatase (APISO): 82 U/L (ref 37–153)
BUN: 19 mg/dL (ref 7–25)
CO2: 28 mmol/L (ref 20–32)
Calcium: 10.3 mg/dL (ref 8.6–10.4)
Chloride: 106 mmol/L (ref 98–110)
Creat: 0.81 mg/dL (ref 0.60–0.88)
GFR, Est African American: 77 mL/min/{1.73_m2} (ref >=60)
GFR, Est Non African American: 67 mL/min/{1.73_m2} (ref >=60)
Globulin: 2.4 g/dL (calc) (ref 1.9–3.7)
Glucose, Bld: 93 mg/dL (ref 65–99)
Potassium: 4.3 mmol/L (ref 3.5–5.3)
Sodium: 142 mmol/L (ref 135–146)
Total Bilirubin: 0.7 mg/dL (ref 0.2–1.2)
Total Protein: 6.9 g/dL (ref 6.1–8.1)

## 2019-07-09 LAB — URINALYSIS, ROUTINE W REFLEX MICROSCOPIC
Bilirubin Urine: NEGATIVE
Glucose, UA: NEGATIVE
Hgb urine dipstick: NEGATIVE
Ketones, ur: NEGATIVE
Leukocytes,Ua: NEGATIVE
Nitrite: NEGATIVE
Protein, ur: NEGATIVE
Specific Gravity, Urine: 1.013 (ref 1.001–1.03)
pH: 8 (ref 5.0–8.0)

## 2019-07-09 LAB — LIPID PANEL
Cholesterol: 215 mg/dL — ABNORMAL HIGH (ref <200)
HDL: 63 mg/dL (ref >=50)
LDL Cholesterol (Calc): 135 mg/dL (calc) — ABNORMAL HIGH
Non-HDL Cholesterol (Calc): 152 mg/dL (calc) — ABNORMAL HIGH (ref <130)
Total CHOL/HDL Ratio: 3.4 (calc) (ref <5.0)
Triglycerides: 80 mg/dL (ref <150)

## 2019-07-15 NOTE — Progress Notes (Signed)
Subjective:   [name] is a 85 y.o. female who presents for Medicare Annual (Subsequent) preventive examination.  Review of Systems:  No ROS.  Medicare Wellness Virtual Visit.  Visual/audio telehealth visit, UTA vital signs.   See social history for additional risk factors.    Cardiac Risk Factors include: advanced age (>61men, >69 women);hypertension Sleep patterns: Getting on average 5-6 hours of sleep a night. Does wake up a lot during the night. Wakes up 1 time a night to void. Does take naps during the day.   Home Safety/Smoke Alarms: Feels safe in home. Smoke alarms in place.  Living environment; Lives with husband in a 1 story home and stairs have handrails on them. Shower is a walk in shower with grab bars in place. Seat Belt Safety/Bike Helmet: Wears seat belt.   Female:   Pap-  Aged out     Mammo- Aged out      Dexa scan- scheduled for 07/17/19        CCS- Aged out     Objective:     Vitals: BP 136/78   Pulse 62   Temp (!) 97.5 F (36.4 C) (Oral)   Ht 5\' 1"  (1.549 m)   Wt 130 lb (59 kg)   BMI 24.56 kg/m   Body mass index is 24.56 kg/m.  Advanced Directives 07/22/2019 07/18/2018 07/17/2017 07/01/2014 01/08/2014  Does Patient Have a Medical Advance Directive? Yes Yes Yes Yes Patient has advance directive, copy not in chart  Type of Advance Directive [place];Living will [place];Living will [place];Living will Living will Living will  Does patient want to make changes to medical advance directive? No - Patient declined No - Patient declined - - -  Copy of [place] in Chart? No - copy requested No - copy requested No - copy requested No - copy requested -    Tobacco Social History   Tobacco Use  Smoking Status Never Smoker  Smokeless Tobacco Never Used     Counseling given: No   Clinical Intake:  Pre-visit preparation completed: Yes  Pain : No/denies pain      Nutritional Risks: None Diabetes: No  How often do you need to have someone help you when you read instructions, pamphlets, or other written materials from your doctor or pharmacy?: 1 - Never What is the last grade level you completed in school?: 16  Interpreter Needed?: No  Information entered by :: Orlie Dakin, LPN  Past Medical History:  Diagnosis Date  . Asthma, exercise induced   . Difficulty hearing   . History of parathyroid disease   . HTN (hypertension)   . Macular degeneration   . Migraine headache without aura   . OP (osteoporosis)   . Problems related to lack of adequate sleep    Past Surgical History:  Procedure Laterality Date  . ABDOMINAL HYSTERECTOMY     she thinks ovaries removed as well.    Marland Kitchen Anterior acromionectomy  07/31/00   left  . BREAST LUMPECTOMY  1990  . CATARACT EXTRACTION  [id number]  . PARATHYROIDECTOMY  09/06/04   Minimally invasive parathyroid surgery by Dr. Charise Killian in [place], [place].  . repair of torn rotator cuff  07/31/00   left, and 2007  . TONSILLECTOMY     Family History  Problem Relation Age of Onset  . Heart disease Father    Social History   Socioeconomic History  . Marital status: Married  Spouse name: [name]  . Number of children: 2  . Years of education: 16  . Highest education level: Bachelor's degree (e.g., BA, AB, BS)  Occupational History  . Occupation: Games developer: RETIRED    Comment: retired  Tobacco Use  . Smoking status: Never Smoker  . Smokeless tobacco: Never Used  Substance and Sexual Activity  . Alcohol use: No  . Drug use: No  . Sexual activity: Yes    Partners: Male  Other Topics Concern  . Not on file  Social History Narrative   At 4 years of college. No regular exercise. No significant caffeine intake.Doesn't like to drive as much as she used to.   Social Determinants of Health   Financial Resource Strain:   . Difficulty of Paying Living Expenses: Not on file  Food  Insecurity:   . Worried About Charity fundraiser in the Last Year: Not on file  . Ran Out of Food in the Last Year: Not on file  Transportation Needs:   . Lack of Transportation (Medical): Not on file  . Lack of Transportation (Non-Medical): Not on file  Physical Activity:   . Days of Exercise per Week: Not on file  . Minutes of Exercise per Session: Not on file  Stress:   . Feeling of Stress : Not on file  Social Connections:   . Frequency of Communication with Friends and Family: Not on file  . Frequency of Social Gatherings with Friends and Family: Not on file  . Attends Religious Services: Not on file  . Active Member of Clubs or Organizations: Not on file  . Attends Archivist Meetings: Not on file  . Marital Status: Not on file    Outpatient Encounter Medications as of 07/22/2019  Medication Sig  . albuterol (PROAIR HFA) 108 (90 Base) MCG/ACT inhaler inhale 2 puffs by mouth every 6 hours if needed  . AMBULATORY NON FORMULARY MEDICATION Take 1 Scoop by mouth at bedtime as needed. Medication Name: Thomas Hoff herbs 1 cap nightly as needed  . amLODipine (NORVASC) 5 MG tablet Take 1 tablet (5 mg total) by mouth daily.  Marland Kitchen aspirin 81 MG chewable tablet Chew 81 mg by mouth daily.  . B Complex Vitamins (VITAMIN B COMPLEX PO) Take 1 tablet by mouth every other day.  . Calcium Citrate-Vitamin D (CALCIUM CITRATE + D PO) Take 1 tablet by mouth 2 (two) times daily.  . Cholecalciferol (VITAMIN D3) 1000 units CAPS Take 1 capsule by mouth daily.  Marland Kitchen losartan (COZAAR) 100 MG tablet Take 1 tablet (100 mg total) by mouth daily.  . Magnesium 200 MG TABS Take 4 tablets by mouth daily.  . Melatonin 10 MG CAPS Take 1 capsule by mouth as needed.  Marland Kitchen Methylsulfonylmethane (MSM) POWD Take 2 Scoops by mouth 2 (two) times daily as needed. 2 teaspoons BID PRN  . Multiple Vitamins-Minerals (PRESERVISION AREDS 2) CAPS Take 1 tablet by mouth 2 (two) times daily.  . NON FORMULARY 0.75 mLs. Hemp oil .75 ml   . NON FORMULARY Take 3 tablets by mouth daily. curamin for pain  . umeclidinium-vilanterol (ANORO ELLIPTA) 62.5-25 MCG/INH AEPB INHALE 1 PUFF ONCE DAILY  . vitamin C (ASCORBIC ACID) 500 MG tablet Take 500 mg by mouth daily.   No facility-administered encounter medications on file as of 07/22/2019.    Activities of Daily Living In your present state of health, do you have any difficulty performing the following activities: 07/22/2019  Hearing? N  Vision? Y  Comment macular degeneration. Does have some difficulty seeing and reading things  Difficulty concentrating or making decisions? Y  Comment has some difficulty multi tasking.  Walking or climbing stairs? N  Dressing or bathing? N  Doing errands, shopping? N  Preparing Food and eating ? N  Using the Toilet? N  In the past six months, have you accidently leaked urine? Y  Comment has noticed this if waits to long to go  Do you have problems with loss of bowel control? N  Managing your Medications? N  Managing your Finances? N  Housekeeping or managing your Housekeeping? N  Some recent data might be hidden    Patient Care Team: [name], MD as PCP - General (Family Medicine) Renelda Loma, [place] (Optometry) Crista Luria, MD as Attending Physician (Dermatology) Melida Quitter, MD as Attending Physician (Otolaryngology) Toribio Harbour, [place] (Chiropractic Medicine)    Assessment:   This is a routine wellness examination for [place].Physical assessment deferred to PCP.   Exercise Activities and Dietary recommendations Current Exercise Habits: The patient does not participate in regular exercise at present, Exercise limited by: None identified Diet  Eats a healthy diet of fruits, vegetables and protein. Takes calcium supllement daily. Breakfast: cereal with fruit and nuts and water Lunch: Meats and a vegetable Dinner: Meats and vegetables.  Drinks 1-2 glasses of water daily.        Goals    . Exercise 3x per week (30  min per time)     Try to start walking at least 3 times a week for 30 minutes at a time.    . Patient Stated     To stay active and keep walking without a cane or walker.       Fall Risk Fall Risk  07/22/2019 07/08/2019 07/18/2018 01/15/2018 07/17/2017  Falls in the past year? 0 0 0 No No  Number falls in past yr: 0 0 - - -  Injury with Fall? 0 0 - - -  Risk for fall due to : - - Impaired balance/gait - -  Follow up Falls prevention discussed - (No Data) - -  Comment - - discussed fall risk - -   Is the patient's home free of loose throw rugs in walkways, pet beds, electrical cords, etc?   yes      Grab bars in the bathroom? yes      Handrails on the stairs?   yes      Adequate lighting?   yes   Depression Screen PHQ 2/9 Scores 07/22/2019 07/08/2019 01/01/2019 07/18/2018  PHQ - 2 Score 1 0 0 0  PHQ- 9 Score - - - -     Cognitive Function     6CIT Screen 07/22/2019 07/18/2018 07/17/2017 07/13/2016  What Year? 0 points 0 points 0 points 0 points  What month? 0 points 0 points 0 points 0 points  What time? 0 points 0 points 0 points 0 points  Count back from 20 0 points 0 points 0 points 0 points  Months in reverse 0 points 2 points 0 points 0 points  Repeat phrase 2 points 2 points 0 points 2 points  Total Score 2 4 0 2    Immunization History  Administered Date(s) Administered  . Influenza Split 05/07/2012  . Influenza, High Dose Seasonal PF 05/28/2013, 05/02/2017, 05/07/2018, 05/18/2019  . Influenza,inj,Quad PF,6+ Mos 04/23/2014, 05/14/2015  . Influenza-Unspecified 05/09/2016  . Pneumococcal Conjugate-13 07/01/2014  . Pneumococcal Polysaccharide-23 08/01/2005  .  Td 08/01/2001  . Tdap 08/01/2005, 10/07/2015    Screening Tests Health Maintenance  Topic Date Due  . DEXA SCAN  07/16/2021  . TETANUS/TDAP  10/06/2025  . INFLUENZA VACCINE  Completed  . PNA vac Low Risk Adult  Completed        Plan:  Please schedule your next medicare wellness visit with me in 1  yr.  Ms. Fishell , Thank you for taking time to come for your Medicare Wellness Visit. I appreciate your ongoing commitment to your health goals. Please review the following plan we discussed and let me know if I can assist you in the future.  Continue doing brain stimulating activities (puzzles, reading, adult coloring books, staying active) to keep memory sharp.    These are the goals we discussed: Goals    . Exercise 3x per week (30 min per time)     Try to start walking at least 3 times a week for 30 minutes at a time.    . Patient Stated     To stay active and keep walking without a cane or walker.       This is a list of the screening recommended for you and due dates:  Health Maintenance  Topic Date Due  . DEXA scan (bone density measurement)  07/16/2021  . Tetanus Vaccine  10/06/2025  . Flu Shot  Completed  . Pneumonia vaccines  Completed     I have personally reviewed and noted the following in the patient's chart:   . Medical and social history . Use of alcohol, tobacco or illicit drugs  . Current medications and supplements . Functional ability and status . Nutritional status . Physical activity . Advanced directives . List of other physicians . Hospitalizations, surgeries, and ER visits in previous 12 months . Vitals . Screenings to include cognitive, depression, and falls . Referrals and appointments  In addition, I have reviewed and discussed with patient certain preventive protocols, quality metrics, and best practice recommendations. A written personalized care plan for preventive services as well as general preventive health recommendations were provided to patient.     Joanne Chars, LPN  [id number]

## 2019-07-17 ENCOUNTER — Ambulatory Visit (INDEPENDENT_AMBULATORY_CARE_PROVIDER_SITE_OTHER): Payer: Medicare HMO

## 2019-07-17 ENCOUNTER — Other Ambulatory Visit: Payer: Self-pay

## 2019-07-17 DIAGNOSIS — M8000XA Age-related osteoporosis with current pathological fracture, unspecified site, initial encounter for fracture: Secondary | ICD-10-CM | POA: Diagnosis not present

## 2019-07-17 DIAGNOSIS — Z780 Asymptomatic menopausal state: Secondary | ICD-10-CM | POA: Diagnosis not present

## 2019-07-17 DIAGNOSIS — M8589 Other specified disorders of bone density and structure, multiple sites: Secondary | ICD-10-CM | POA: Diagnosis not present

## 2019-07-22 ENCOUNTER — Ambulatory Visit (INDEPENDENT_AMBULATORY_CARE_PROVIDER_SITE_OTHER): Payer: Medicare HMO | Admitting: *Deleted

## 2019-07-22 VITALS — BP 136/78 | HR 62 | Temp 97.5°F | Ht 61.0 in | Wt 130.0 lb

## 2019-07-22 DIAGNOSIS — Z0000 Encounter for general adult medical examination without abnormal findings: Secondary | ICD-10-CM | POA: Diagnosis not present

## 2019-07-22 NOTE — Patient Instructions (Signed)
Please schedule your next medicare wellness visit with me in 1 yr.  [name] , Thank you for taking time to come for your Medicare Wellness Visit. I appreciate your ongoing commitment to your health goals. Please review the following plan we discussed and let me know if I can assist you in the future.  Continue doing brain stimulating activities (puzzles, reading, adult coloring books, staying active) to keep memory sharp.   These are the goals we discussed: Goals    . Exercise 3x per week (30 min per time)     Try to start walking at least 3 times a week for 30 minutes at a time.    . Patient Stated     To stay active and keep walking without a cane or walker.

## 2019-08-07 DIAGNOSIS — D3132 Benign neoplasm of left choroid: Secondary | ICD-10-CM | POA: Diagnosis not present

## 2019-08-07 DIAGNOSIS — H35371 Puckering of macula, right eye: Secondary | ICD-10-CM | POA: Diagnosis not present

## 2019-08-07 DIAGNOSIS — H353211 Exudative age-related macular degeneration, right eye, with active choroidal neovascularization: Secondary | ICD-10-CM | POA: Diagnosis not present

## 2019-08-07 DIAGNOSIS — H3552 Pigmentary retinal dystrophy: Secondary | ICD-10-CM | POA: Diagnosis not present

## 2019-10-16 DIAGNOSIS — H353211 Exudative age-related macular degeneration, right eye, with active choroidal neovascularization: Secondary | ICD-10-CM | POA: Diagnosis not present

## 2019-10-16 DIAGNOSIS — D3132 Benign neoplasm of left choroid: Secondary | ICD-10-CM | POA: Diagnosis not present

## 2019-10-16 DIAGNOSIS — H353122 Nonexudative age-related macular degeneration, left eye, intermediate dry stage: Secondary | ICD-10-CM | POA: Diagnosis not present

## 2019-10-16 DIAGNOSIS — H35371 Puckering of macula, right eye: Secondary | ICD-10-CM | POA: Diagnosis not present

## 2019-10-28 DIAGNOSIS — H353211 Exudative age-related macular degeneration, right eye, with active choroidal neovascularization: Secondary | ICD-10-CM | POA: Diagnosis not present

## 2019-10-28 DIAGNOSIS — H524 Presbyopia: Secondary | ICD-10-CM | POA: Diagnosis not present

## 2019-11-12 DIAGNOSIS — R69 Illness, unspecified: Secondary | ICD-10-CM | POA: Diagnosis not present

## 2019-11-14 DIAGNOSIS — R69 Illness, unspecified: Secondary | ICD-10-CM | POA: Diagnosis not present

## 2020-01-01 ENCOUNTER — Other Ambulatory Visit: Payer: Self-pay | Admitting: Family Medicine

## 2020-01-01 DIAGNOSIS — J449 Chronic obstructive pulmonary disease, unspecified: Secondary | ICD-10-CM

## 2020-01-06 ENCOUNTER — Encounter: Payer: Self-pay | Admitting: Family Medicine

## 2020-01-06 ENCOUNTER — Ambulatory Visit (INDEPENDENT_AMBULATORY_CARE_PROVIDER_SITE_OTHER): Payer: Medicare HMO | Admitting: Family Medicine

## 2020-01-06 VITALS — BP 162/69 | HR 63 | Ht 61.0 in | Wt 126.0 lb

## 2020-01-06 DIAGNOSIS — I10 Essential (primary) hypertension: Secondary | ICD-10-CM

## 2020-01-06 DIAGNOSIS — I700 Atherosclerosis of aorta: Secondary | ICD-10-CM | POA: Diagnosis not present

## 2020-01-06 DIAGNOSIS — R002 Palpitations: Secondary | ICD-10-CM | POA: Diagnosis not present

## 2020-01-06 DIAGNOSIS — J449 Chronic obstructive pulmonary disease, unspecified: Secondary | ICD-10-CM | POA: Diagnosis not present

## 2020-01-06 LAB — BASIC METABOLIC PANEL WITH GFR
BUN: 22 mg/dL (ref 7–25)
CO2: 28 mmol/L (ref 20–32)
Calcium: 10 mg/dL (ref 8.6–10.4)
Chloride: 108 mmol/L (ref 98–110)
Creat: 0.84 mg/dL (ref 0.60–0.88)
GFR, Est African American: 73 mL/min/{1.73_m2} (ref >=60)
GFR, Est Non African American: 63 mL/min/{1.73_m2} (ref >=60)
Glucose, Bld: 89 mg/dL (ref 65–99)
Potassium: 4.3 mmol/L (ref 3.5–5.3)
Sodium: 142 mmol/L (ref 135–146)

## 2020-01-06 MED ORDER — ANORO ELLIPTA 62.5-25 MCG/INH IN AEPB: INHALATION_SPRAY | RESPIRATORY_TRACT | 5 refills | Status: DC

## 2020-01-06 NOTE — Assessment & Plan Note (Signed)
No recent changes.  New current regimen.

## 2020-01-06 NOTE — Assessment & Plan Note (Signed)
Brought in home blood pressures.  Most of them look absolutely fantastic she does have a few in the 140s.

## 2020-01-06 NOTE — Assessment & Plan Note (Signed)
Orts that she still gets some occasional palpitations.  She is had them since at least 2013 she says they have been worse or different than usual.  She notices them more if she is feeling stressed.  Just feels like an occasional skipping sensation.  Occasional heart racing but not frequent.

## 2020-01-06 NOTE — Assessment & Plan Note (Signed)
No recent flares or shortness of breath.  Has only had to use albuterol a couple times which is great.  Will refill Norco today.

## 2020-01-06 NOTE — Progress Notes (Signed)
Established Patient Office Visit  Subjective:  Patient ID: [name], female    DOB: [birth year]  Age: 86 y.o. MRN: [id number]  CC:  Chief Complaint  Patient presents with  . Hypertension    HPI [name] presents for   Hypertension- Pt denies chest pain, SOB, dizziness, or heart palpitations.  Taking meds as directed w/o problems.  Denies medication side effects.    F/U COPD -   no recent chest pain or shortness of breath says she is actually done really well this spring she is actually only had to use her albuterol twice which is great.  Needs refill on Anoro.  Atherosclerosis-again no recent chest pain or shortness of breath.  She says occasionally she will get some swelling in her legs but is mostly in her knees.  She has a Baker's cyst that sometimes becomes more problematic and causes more pain and discomfort.  She said she had a flare recently and just took her MSM and used a compression sleeve-and was able to get it back under control.  Past Medical History:  Diagnosis Date  . Asthma, exercise induced   . Difficulty hearing   . History of parathyroid disease   . HTN (hypertension)   . Macular degeneration   . Migraine headache without aura   . OP (osteoporosis)   . Problems related to lack of adequate sleep     Past Surgical History:  Procedure Laterality Date  . ABDOMINAL HYSTERECTOMY     she thinks ovaries removed as well.    Marland Kitchen Anterior acromionectomy  07/31/00   left  . BREAST LUMPECTOMY  1990  . CATARACT EXTRACTION  [id number]  . PARATHYROIDECTOMY  09/06/04   Minimally invasive parathyroid surgery by Dr. Charise Killian in [place], [place].  . repair of torn rotator cuff  07/31/00   left, and 2007  . TONSILLECTOMY      Family History  Problem Relation Age of Onset  . Heart disease Father     Social History   Socioeconomic History  . Marital status: Married    Spouse name: [name]  . Number of children: 2  . Years of education: 16  . Highest  education level: Bachelor's degree (e.g., BA, AB, BS)  Occupational History  . Occupation: Games developer: RETIRED    Comment: retired  Tobacco Use  . Smoking status: Never Smoker  . Smokeless tobacco: Never Used  Substance and Sexual Activity  . Alcohol use: No  . Drug use: No  . Sexual activity: Yes    Partners: Male  Other Topics Concern  . Not on file  Social History Narrative   At 4 years of college. No regular exercise. No significant caffeine intake.Doesn't like to drive as much as she used to.   Social Determinants of Health   Financial Resource Strain:   . Difficulty of Paying Living Expenses:   Food Insecurity:   . Worried About Charity fundraiser in the Last Year:   . Arboriculturist in the Last Year:   Transportation Needs:   . Film/video editor (Medical):   Marland Kitchen Lack of Transportation (Non-Medical):   Physical Activity:   . Days of Exercise per Week:   . Minutes of Exercise per Session:   Stress:   . Feeling of Stress :   Social Connections:   . Frequency of Communication with Friends and Family:   . Frequency of Social Gatherings with Friends and Family:   .  Attends Religious Services:   . Active Member of Clubs or Organizations:   . Attends Archivist Meetings:   Marland Kitchen Marital Status:   Intimate Partner Violence:   . Fear of Current or Ex-Partner:   . Emotionally Abused:   Marland Kitchen Physically Abused:   . Sexually Abused:     Outpatient Medications Prior to Visit  Medication Sig Dispense Refill  . albuterol (PROAIR HFA) 108 (90 Base) MCG/ACT inhaler inhale 2 puffs by mouth every 6 hours if needed 25.5 Inhaler 1  . amLODipine (NORVASC) 5 MG tablet Take 1 tablet (5 mg total) by mouth daily. 90 tablet 1  . aspirin 81 MG chewable tablet Chew 81 mg by mouth daily.    . B Complex Vitamins (VITAMIN B COMPLEX PO) Take 1 tablet by mouth every other day.    . Calcium Citrate-Vitamin D (CALCIUM CITRATE + D PO) Take 1 tablet by mouth 2 (two) times  daily.    . Cholecalciferol (VITAMIN D3) 1000 units CAPS Take 1 capsule by mouth daily.    Marland Kitchen losartan (COZAAR) 100 MG tablet Take 1 tablet (100 mg total) by mouth daily. 90 tablet 1  . Magnesium 200 MG TABS Take 4 tablets by mouth daily.    Marland Kitchen Methylsulfonylmethane (MSM) POWD Take 2 Scoops by mouth 2 (two) times daily as needed. 2 teaspoons BID PRN    . Multiple Vitamins-Minerals (PRESERVISION AREDS 2) CAPS Take 1 tablet by mouth 2 (two) times daily.    . NON FORMULARY Take 3 tablets by mouth daily. curamin for pain    . vitamin C (ASCORBIC ACID) 500 MG tablet Take 500 mg by mouth daily.    Jearl Klinefelter ELLIPTA 62.5-25 MCG/INH AEPB INHALE 1 PUFF BY MOUTH ONCE DAILY 60 each 5  . AMBULATORY NON FORMULARY MEDICATION Take 1 Scoop by mouth at bedtime as needed. Medication Name: Thomas Hoff herbs 1 cap nightly as needed    . Melatonin 10 MG CAPS Take 1 capsule by mouth as needed.    . NON FORMULARY 0.75 mLs. Hemp oil .75 ml     No facility-administered medications prior to visit.    Allergies  Allergen Reactions  . Erythromycin Swelling  . Penicillins Swelling, Rash and Hives    ROS Review of Systems    Objective:    Physical Exam  Constitutional: She is oriented to person, place, and time. She appears well-developed and well-nourished.  HENT:  Head: Normocephalic and atraumatic.  Cardiovascular: Normal rate, regular rhythm and normal heart sounds.  Pulmonary/Chest: Effort normal and breath sounds normal.  Neurological: She is alert and oriented to person, place, and time.  Skin: Skin is warm and dry.  Psychiatric: She has a normal mood and affect. Her behavior is normal.    BP (!) 162/69   Pulse 63   Ht 5\' 1"  (1.549 m)   Wt 126 lb (57.2 kg)   SpO2 100%   BMI 23.81 kg/m  Wt Readings from Last 3 Encounters:  01/06/20 126 lb (57.2 kg)  07/22/19 130 lb (59 kg)  07/08/19 130 lb (59 kg)     There are no preventive care reminders to display for this patient.  There are no preventive  care reminders to display for this patient.  Lab Results  Component Value Date   TSH 1.520 07/01/2014   Lab Results  Component Value Date   WBC 5.8 06/15/2012   HGB 14.4 06/15/2012   HCT 43.1 06/15/2012   MCV 88.5 06/15/2012   PLT  258 06/15/2012   Lab Results  Component Value Date   NA 142 07/08/2019   K 4.3 07/08/2019   CO2 28 07/08/2019   GLUCOSE 93 07/08/2019   BUN 19 07/08/2019   CREATININE 0.81 07/08/2019   BILITOT 0.7 07/08/2019   ALKPHOS 79 07/13/2016   AST 22 07/08/2019   ALT 19 07/08/2019   PROT 6.9 07/08/2019   ALBUMIN 4.2 07/13/2016   CALCIUM 10.3 07/08/2019   Lab Results  Component Value Date   CHOL 215 (H) 07/08/2019   Lab Results  Component Value Date   HDL 63 07/08/2019   Lab Results  Component Value Date   LDLCALC 135 (H) 07/08/2019   Lab Results  Component Value Date   TRIG 80 07/08/2019   Lab Results  Component Value Date   CHOLHDL 3.4 07/08/2019   No results found for: HGBA1C    Assessment & Plan:   Problem List Items Addressed This Visit      Cardiovascular and Mediastinum   Essential hypertension    Brought in home blood pressures.  Most of them look absolutely fantastic she does have a few in the 140s.      Relevant Orders   BASIC METABOLIC PANEL WITH GFR   Aortic atherosclerosis ([place]) - Primary    No recent changes.  New current regimen.        Respiratory   COPD (chronic obstructive pulmonary disease) (HCC)    No recent flares or shortness of breath.  Has only had to use albuterol a couple times which is great.  Will refill Norco today.      Relevant Medications   umeclidinium-vilanterol (ANORO ELLIPTA) 62.5-25 MCG/INH AEPB     Other   Palpitations    Orts that she still gets some occasional palpitations.  She is had them since at least 2013 she says they have been worse or different than usual.  She notices them more if she is feeling stressed.  Just feels like an occasional skipping sensation.  Occasional heart  racing but not frequent.         Meds ordered this encounter  Medications  . umeclidinium-vilanterol (ANORO ELLIPTA) 62.5-25 MCG/INH AEPB    Sig: INHALE 1 PUFF BY MOUTH ONCE DAILY    Dispense:  60 each    Refill:  5    Follow-up: No follow-ups on file.    Beatrice Lecher, MD

## 2020-01-07 NOTE — Progress Notes (Signed)
All labs are normal. 

## 2020-01-08 DIAGNOSIS — H35371 Puckering of macula, right eye: Secondary | ICD-10-CM | POA: Diagnosis not present

## 2020-01-08 DIAGNOSIS — D3132 Benign neoplasm of left choroid: Secondary | ICD-10-CM | POA: Diagnosis not present

## 2020-01-08 DIAGNOSIS — H353211 Exudative age-related macular degeneration, right eye, with active choroidal neovascularization: Secondary | ICD-10-CM | POA: Diagnosis not present

## 2020-01-08 DIAGNOSIS — H353122 Nonexudative age-related macular degeneration, left eye, intermediate dry stage: Secondary | ICD-10-CM | POA: Diagnosis not present

## 2020-01-24 ENCOUNTER — Other Ambulatory Visit: Payer: Self-pay | Admitting: Family Medicine

## 2020-01-24 DIAGNOSIS — I10 Essential (primary) hypertension: Secondary | ICD-10-CM

## 2020-02-20 DIAGNOSIS — R238 Other skin changes: Secondary | ICD-10-CM | POA: Diagnosis not present

## 2020-02-20 DIAGNOSIS — L570 Actinic keratosis: Secondary | ICD-10-CM | POA: Diagnosis not present

## 2020-02-20 DIAGNOSIS — Z86018 Personal history of other benign neoplasm: Secondary | ICD-10-CM | POA: Diagnosis not present

## 2020-02-20 DIAGNOSIS — L720 Epidermal cyst: Secondary | ICD-10-CM | POA: Diagnosis not present

## 2020-02-20 DIAGNOSIS — D1721 Benign lipomatous neoplasm of skin and subcutaneous tissue of right arm: Secondary | ICD-10-CM | POA: Diagnosis not present

## 2020-02-20 DIAGNOSIS — L821 Other seborrheic keratosis: Secondary | ICD-10-CM | POA: Diagnosis not present

## 2020-02-20 DIAGNOSIS — D225 Melanocytic nevi of trunk: Secondary | ICD-10-CM | POA: Diagnosis not present

## 2020-02-20 DIAGNOSIS — L578 Other skin changes due to chronic exposure to nonionizing radiation: Secondary | ICD-10-CM | POA: Diagnosis not present

## 2020-02-20 DIAGNOSIS — Z85828 Personal history of other malignant neoplasm of skin: Secondary | ICD-10-CM | POA: Diagnosis not present

## 2020-04-14 DIAGNOSIS — H35371 Puckering of macula, right eye: Secondary | ICD-10-CM | POA: Diagnosis not present

## 2020-04-14 DIAGNOSIS — H43813 Vitreous degeneration, bilateral: Secondary | ICD-10-CM | POA: Diagnosis not present

## 2020-04-14 DIAGNOSIS — H353211 Exudative age-related macular degeneration, right eye, with active choroidal neovascularization: Secondary | ICD-10-CM | POA: Diagnosis not present

## 2020-04-14 DIAGNOSIS — H353122 Nonexudative age-related macular degeneration, left eye, intermediate dry stage: Secondary | ICD-10-CM | POA: Diagnosis not present

## 2020-04-20 DIAGNOSIS — R69 Illness, unspecified: Secondary | ICD-10-CM | POA: Diagnosis not present

## 2020-05-07 DIAGNOSIS — R69 Illness, unspecified: Secondary | ICD-10-CM | POA: Diagnosis not present

## 2020-06-04 DIAGNOSIS — H35371 Puckering of macula, right eye: Secondary | ICD-10-CM | POA: Diagnosis not present

## 2020-06-04 DIAGNOSIS — H353211 Exudative age-related macular degeneration, right eye, with active choroidal neovascularization: Secondary | ICD-10-CM | POA: Diagnosis not present

## 2020-06-04 DIAGNOSIS — H43813 Vitreous degeneration, bilateral: Secondary | ICD-10-CM | POA: Diagnosis not present

## 2020-06-04 DIAGNOSIS — H353221 Exudative age-related macular degeneration, left eye, with active choroidal neovascularization: Secondary | ICD-10-CM | POA: Diagnosis not present

## 2020-06-06 DIAGNOSIS — R69 Illness, unspecified: Secondary | ICD-10-CM | POA: Diagnosis not present

## 2020-06-22 ENCOUNTER — Telehealth: Payer: Self-pay

## 2020-06-22 NOTE — Telephone Encounter (Signed)
[name] called and left a message stating she is in the donut hole. She states [name] helped her last time get the Anoro.

## 2020-07-07 ENCOUNTER — Other Ambulatory Visit: Payer: Self-pay

## 2020-07-07 ENCOUNTER — Ambulatory Visit (INDEPENDENT_AMBULATORY_CARE_PROVIDER_SITE_OTHER): Payer: Medicare HMO | Admitting: Family Medicine

## 2020-07-07 ENCOUNTER — Encounter: Payer: Self-pay | Admitting: Family Medicine

## 2020-07-07 VITALS — BP 130/62 | HR 66 | Ht 61.0 in | Wt 125.0 lb

## 2020-07-07 DIAGNOSIS — R69 Illness, unspecified: Secondary | ICD-10-CM | POA: Diagnosis not present

## 2020-07-07 DIAGNOSIS — J449 Chronic obstructive pulmonary disease, unspecified: Secondary | ICD-10-CM

## 2020-07-07 DIAGNOSIS — F5101 Primary insomnia: Secondary | ICD-10-CM

## 2020-07-07 DIAGNOSIS — I10 Essential (primary) hypertension: Secondary | ICD-10-CM | POA: Diagnosis not present

## 2020-07-07 DIAGNOSIS — H353110 Nonexudative age-related macular degeneration, right eye, stage unspecified: Secondary | ICD-10-CM | POA: Diagnosis not present

## 2020-07-07 DIAGNOSIS — J45990 Exercise induced bronchospasm: Secondary | ICD-10-CM

## 2020-07-07 DIAGNOSIS — I700 Atherosclerosis of aorta: Secondary | ICD-10-CM | POA: Diagnosis not present

## 2020-07-07 MED ORDER — ATORVASTATIN CALCIUM 10 MG PO TABS: 10.0000 mg | ORAL_TABLET | Freq: Every day | ORAL | 1 refills | Status: DC

## 2020-07-07 MED ORDER — ANORO ELLIPTA 62.5-25 MCG/INH IN AEPB: INHALATION_SPRAY | RESPIRATORY_TRACT | 5 refills | Status: DC

## 2020-07-07 MED ORDER — AMLODIPINE BESYLATE 5 MG PO TABS: 5.0000 mg | ORAL_TABLET | Freq: Every day | ORAL | 1 refills | Status: DC

## 2020-07-07 MED ORDER — LOSARTAN POTASSIUM 100 MG PO TABS: 100.0000 mg | ORAL_TABLET | Freq: Every day | ORAL | 1 refills | Status: DC

## 2020-07-07 MED ORDER — ALBUTEROL SULFATE HFA 108 (90 BASE) MCG/ACT IN AERS: INHALATION_SPRAY | RESPIRATORY_TRACT | 99 refills | Status: DC

## 2020-07-07 NOTE — Assessment & Plan Note (Signed)
Discussed nonmedication strategies and sleep hygiene as well as relaxation techniques to improve her sleep quality.

## 2020-07-07 NOTE — Assessment & Plan Note (Signed)
Is need for starting statin as there is evidence of atherosclerotic disease on her imaging.  1 about potential side effects of statin will monitor carefully.

## 2020-07-07 NOTE — Assessment & Plan Note (Signed)
Stable.  No changes.  No recent flares.

## 2020-07-07 NOTE — Assessment & Plan Note (Signed)
Reminded her she can pre-treat if needed for exercise.

## 2020-07-07 NOTE — Assessment & Plan Note (Signed)
Home blood pressures are well controlled we will recheck it again before she leaves today.  She is due for updated blood work as well.

## 2020-07-07 NOTE — Telephone Encounter (Signed)
Spoke w/pt about this she informed me that the pharmacy was able to assist her

## 2020-07-07 NOTE — Assessment & Plan Note (Signed)
Unfortunately she has had significant vision loss and has affected her reading.

## 2020-07-07 NOTE — Progress Notes (Addendum)
Established Patient Office Visit  Subjective:  Patient ID: [name], female    DOB: 02/27/1933  Age: 86 y.o. MRN: [id number]  CC:  Chief Complaint  Patient presents with  . Hypertension    HPI [name] presents for   Hypertension- Pt denies chest pain, SOB, dizziness, or heart palpitations.  Taking meds as directed w/o problems.  Denies medication side effects.  Brought in home log.  BPs range from 113-153. Most are under 140.    She has been having problems with her vision since about May.  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  She has been getting injections in her eye and has affected her reading.    F/U asthma and COPD -she is actually doing really well she says she has not really had any flares or exacerbations and in fact they went to the mountains recently and she was able to keep up with the family without feeling short of breath.  She says the last time they went she was unable to do the.  Abdominal aortic atherosclerosis-she is not currently on a statin.    Also still really struggling with chronic insomnia.  She says she tries not to take any medications sometimes she just has to get out of bed and walk around and move around.  She feels like a lot of it really is anxiety driven she still is into tapes to help her relax and just has not done it in a long time.  She often will fall asleep in a decent amount of time but then just wake up feeling troubled and anxious.  Past Medical History:  Diagnosis Date  . Asthma, exercise induced   . Difficulty hearing   . History of parathyroid disease   . HTN (hypertension)   . Macular degeneration   . Migraine headache without aura   . OP (osteoporosis)   . Problems related to lack of adequate sleep     Past Surgical History:  Procedure Laterality Date  . ABDOMINAL HYSTERECTOMY     she thinks ovaries removed as well.    Marland Kitchen Anterior acromionectomy  07/31/00   left  .  BREAST LUMPECTOMY  1990  . CATARACT EXTRACTION  [id number]  . PARATHYROIDECTOMY  09/06/04   Minimally invasive parathyroid surgery by Dr. Charise Killian in [place], [place].  . repair of torn rotator cuff  07/31/00   left, and 2007  . TONSILLECTOMY      Family History  Problem Relation Age of Onset  . Heart disease Father     Social History   Socioeconomic History  . Marital status: Married    Spouse name: [name]  . Number of children: 2  . Years of education: 16  . Highest education level: Bachelor's degree (e.g., BA, AB, BS)  Occupational History  . Occupation: Games developer: RETIRED    Comment: retired  Tobacco Use  . Smoking status: Never Smoker  . Smokeless tobacco: Never Used  Vaping Use  . Vaping Use: Never used  Substance and Sexual Activity  . Alcohol use: No  . Drug use: No  . Sexual activity: Yes    Partners: Male  Other Topics Concern  . Not on file  Social History Narrative   At 4 years of college. No regular exercise. No significant caffeine intake.Doesn't like to drive as much as she used to.   Social Determinants of Health   Financial Resource Strain:   .  Difficulty of Paying Living Expenses: Not on file  Food Insecurity:   . Worried About Charity fundraiser in the Last Year: Not on file  . Ran Out of Food in the Last Year: Not on file  Transportation Needs:   . Lack of Transportation (Medical): Not on file  . Lack of Transportation (Non-Medical): Not on file  Physical Activity:   . Days of Exercise per Week: Not on file  . Minutes of Exercise per Session: Not on file  Stress:   . Feeling of Stress : Not on file  Social Connections:   . Frequency of Communication with Friends and Family: Not on file  . Frequency of Social Gatherings with Friends and Family: Not on file  . Attends Religious Services: Not on file  . Active Member of Clubs or Organizations: Not on file  . Attends Archivist Meetings: Not on file  . Marital  Status: Not on file  Intimate Partner Violence:   . Fear of Current or Ex-Partner: Not on file  . Emotionally Abused: Not on file  . Physically Abused: Not on file  . Sexually Abused: Not on file    Outpatient Medications Prior to Visit  Medication Sig Dispense Refill  . B Complex Vitamins (VITAMIN B COMPLEX PO) Take 1 tablet by mouth every other day.    . Calcium Citrate-Vitamin D (CALCIUM CITRATE + D PO) Take 1 tablet by mouth 2 (two) times daily.    . Cholecalciferol (VITAMIN D3) 1000 units CAPS Take 1 capsule by mouth daily.    . Magnesium 200 MG TABS Take 4 tablets by mouth daily.    Marland Kitchen Methylsulfonylmethane (MSM) POWD Take 2 Scoops by mouth 2 (two) times daily as needed. 2 teaspoons BID PRN    . Multiple Vitamins-Minerals (PRESERVISION AREDS 2) CAPS Take 1 tablet by mouth 2 (two) times daily.    . NON FORMULARY Take 3 tablets by mouth daily. curamin for pain    . vitamin C (ASCORBIC ACID) 500 MG tablet Take 500 mg by mouth daily.    Marland Kitchen albuterol (PROAIR HFA) 108 (90 Base) MCG/ACT inhaler inhale 2 puffs by mouth every 6 hours if needed 25.5 Inhaler 1  . amLODipine (NORVASC) 5 MG tablet TAKE 1 TABLET BY MOUTH EVERY DAY 90 tablet 1  . losartan (COZAAR) 100 MG tablet TAKE 1 TABLET BY MOUTH EVERY DAY 90 tablet 1  . umeclidinium-vilanterol (ANORO ELLIPTA) 62.5-25 MCG/INH AEPB INHALE 1 PUFF BY MOUTH ONCE DAILY 60 each 5  . aspirin 81 MG chewable tablet Chew 81 mg by mouth daily.     No facility-administered medications prior to visit.    Allergies  Allergen Reactions  . Erythromycin Swelling  . Penicillins Swelling, Rash and Hives    ROS Review of Systems    Objective:    Physical Exam Constitutional:      Appearance: She is well-developed.  HENT:     Head: Normocephalic and atraumatic.  Cardiovascular:     Rate and Rhythm: Normal rate and regular rhythm.     Heart sounds: Normal heart sounds.  Pulmonary:     Effort: Pulmonary effort is normal.     Breath sounds: Normal  breath sounds.  Skin:    General: Skin is warm and dry.  Neurological:     Mental Status: She is alert and oriented to person, place, and time.  Psychiatric:        Behavior: Behavior normal.     BP 130/62  Pulse 66   Ht 5\' 1"  (1.549 m)   Wt 125 lb (56.7 kg)   SpO2 100%   BMI 23.62 kg/m  Wt Readings from Last 3 Encounters:  07/07/20 125 lb (56.7 kg)  01/06/20 126 lb (57.2 kg)  07/22/19 130 lb (59 kg)     There are no preventive care reminders to display for this patient.  There are no preventive care reminders to display for this patient.  Lab Results  Component Value Date   TSH 1.520 07/01/2014   Lab Results  Component Value Date   WBC 5.8 06/15/2012   HGB 14.4 06/15/2012   HCT 43.1 06/15/2012   MCV 88.5 06/15/2012   PLT 258 06/15/2012   Lab Results  Component Value Date   NA 142 01/06/2020   K 4.3 01/06/2020   CO2 28 01/06/2020   GLUCOSE 89 01/06/2020   BUN 22 01/06/2020   CREATININE 0.84 01/06/2020   BILITOT 0.7 07/08/2019   ALKPHOS 79 07/13/2016   AST 22 07/08/2019   ALT 19 07/08/2019   PROT 6.9 07/08/2019   ALBUMIN 4.2 07/13/2016   CALCIUM 10.0 01/06/2020   Lab Results  Component Value Date   CHOL 215 (H) 07/08/2019   Lab Results  Component Value Date   HDL 63 07/08/2019   Lab Results  Component Value Date   LDLCALC 135 (H) 07/08/2019   Lab Results  Component Value Date   TRIG 80 07/08/2019   Lab Results  Component Value Date   CHOLHDL 3.4 07/08/2019   No results found for: HGBA1C    Assessment & Plan:   Problem List Items Addressed This Visit      Cardiovascular and Mediastinum   Essential hypertension - Primary    Home blood pressures are well controlled we will recheck it again before she leaves today.  She is due for updated blood work as well.      Relevant Medications   amLODipine (NORVASC) 5 MG tablet   losartan (COZAAR) 100 MG tablet   atorvastatin (LIPITOR) 10 MG tablet   Other Relevant Orders   Lipid Panel  w/reflex Direct LDL   COMPLETE METABOLIC PANEL WITH GFR   Aortic atherosclerosis ([place])    Is need for starting statin as there is evidence of atherosclerotic disease on her imaging.  1 about potential side effects of statin will monitor carefully.      Relevant Medications   amLODipine (NORVASC) 5 MG tablet   losartan (COZAAR) 100 MG tablet   atorvastatin (LIPITOR) 10 MG tablet     Respiratory   COPD (chronic obstructive pulmonary disease) (HCC)    Stable.  No changes.  No recent flares.       Relevant Medications   umeclidinium-vilanterol (ANORO ELLIPTA) 62.5-25 MCG/INH AEPB   albuterol (PROAIR HFA) 108 (90 Base) MCG/ACT inhaler   Asthma, exercise induced    Reminded her she can pre-treat if needed for exercise.       Relevant Medications   umeclidinium-vilanterol (ANORO ELLIPTA) 62.5-25 MCG/INH AEPB   albuterol (PROAIR HFA) 108 (90 Base) MCG/ACT inhaler   Other Relevant Orders   Lipid Panel w/reflex Direct LDL   COMPLETE METABOLIC PANEL WITH GFR     Other   Nonexudative age-related macular degeneration of right eye    Unfortunately she has had significant vision loss and has affected her reading.        Insomnia    Discussed nonmedication strategies and sleep hygiene as well as relaxation techniques to improve  her sleep quality.         Meds ordered this encounter  Medications  . amLODipine (NORVASC) 5 MG tablet    Sig: Take 1 tablet (5 mg total) by mouth daily.    Dispense:  90 tablet    Refill:  1  . losartan (COZAAR) 100 MG tablet    Sig: Take 1 tablet (100 mg total) by mouth daily.    Dispense:  90 tablet    Refill:  1  . umeclidinium-vilanterol (ANORO ELLIPTA) 62.5-25 MCG/INH AEPB    Sig: INHALE 1 PUFF BY MOUTH ONCE DAILY    Dispense:  60 each    Refill:  5    Patient will call when ready to refill  . albuterol (PROAIR HFA) 108 (90 Base) MCG/ACT inhaler    Sig: inhale 2 puffs by mouth every 6 hours if needed    Dispense:  25.5 g    Refill:  prn     Patient to call when ready to refill  . atorvastatin (LIPITOR) 10 MG tablet    Sig: Take 1 tablet (10 mg total) by mouth at bedtime.    Dispense:  90 tablet    Refill:  1    Follow-up: Return in about 2 months (around 09/07/2020) for New start medication and check liver.    Beatrice Lecher, MD

## 2020-07-07 NOTE — Progress Notes (Signed)
She reports that she has been loosing her sight in her L eye and hasn't been able to drive due to this .

## 2020-07-08 DIAGNOSIS — H35371 Puckering of macula, right eye: Secondary | ICD-10-CM | POA: Diagnosis not present

## 2020-07-08 DIAGNOSIS — H43813 Vitreous degeneration, bilateral: Secondary | ICD-10-CM | POA: Diagnosis not present

## 2020-07-08 DIAGNOSIS — H353231 Exudative age-related macular degeneration, bilateral, with active choroidal neovascularization: Secondary | ICD-10-CM | POA: Diagnosis not present

## 2020-07-08 DIAGNOSIS — D3132 Benign neoplasm of left choroid: Secondary | ICD-10-CM | POA: Diagnosis not present

## 2020-07-08 LAB — COMPLETE METABOLIC PANEL WITH GFR
AG Ratio: 2 (calc) (ref 1.0–2.5)
ALT: 19 U/L (ref 6–29)
AST: 20 U/L (ref 10–35)
Albumin: 4.3 g/dL (ref 3.6–5.1)
Alkaline phosphatase (APISO): 97 U/L (ref 37–153)
BUN: 19 mg/dL (ref 7–25)
CO2: 28 mmol/L (ref 20–32)
Calcium: 9.6 mg/dL (ref 8.6–10.4)
Chloride: 106 mmol/L (ref 98–110)
Creat: 0.78 mg/dL (ref 0.60–0.88)
GFR, Est African American: 80 mL/min/{1.73_m2} (ref >=60)
GFR, Est Non African American: 69 mL/min/{1.73_m2} (ref >=60)
Globulin: 2.1 g/dL (calc) (ref 1.9–3.7)
Glucose, Bld: 90 mg/dL (ref 65–99)
Potassium: 4.3 mmol/L (ref 3.5–5.3)
Sodium: 142 mmol/L (ref 135–146)
Total Bilirubin: 0.6 mg/dL (ref 0.2–1.2)
Total Protein: 6.4 g/dL (ref 6.1–8.1)

## 2020-07-08 LAB — LIPID PANEL W/REFLEX DIRECT LDL
Cholesterol: 227 mg/dL — ABNORMAL HIGH (ref <200)
HDL: 70 mg/dL (ref >=50)
LDL Cholesterol (Calc): 139 mg/dL (calc) — ABNORMAL HIGH
Non-HDL Cholesterol (Calc): 157 mg/dL (calc) — ABNORMAL HIGH (ref <130)
Total CHOL/HDL Ratio: 3.2 (calc) (ref <5.0)
Triglycerides: 81 mg/dL (ref <150)

## 2020-07-22 ENCOUNTER — Ambulatory Visit (INDEPENDENT_AMBULATORY_CARE_PROVIDER_SITE_OTHER): Payer: Medicare HMO | Admitting: Osteopathic Medicine

## 2020-07-22 VITALS — BP 168/72 | HR 60 | Temp 98.1°F | Resp 17 | Ht 64.0 in | Wt 127.0 lb

## 2020-07-22 DIAGNOSIS — Z0000 Encounter for general adult medical examination without abnormal findings: Secondary | ICD-10-CM

## 2020-07-22 NOTE — Progress Notes (Signed)
MEDICARE ANNUAL WELLNESS VISIT  07/22/2020  Subjective:    [name] is a 86 y.o. female patient of Metheney, Barbarann Ehlersatherine D, MD who had a Medicare Annual Wellness Visit today via telephone. [name] is Retired and lives with their spouse. she has 2 children. she reports that she is socially active and does interact with friends/family regularly. she is minimally physically active and enjoys gardening.  Patient Care Team: Cheryl GamesMetheney, Catherine D, MD as PCP - General (Family Medicine) [name], OD (Optometry) Cheryl StallGruber, Hope, MD as Attending Physician (Dermatology) Cheryl ReadingBates, Dwight, MD as Attending Physician (Otolaryngology) [name], [place] (Chiropractic Medicine)  Advanced Directives 07/22/2020 07/22/2019 07/18/2018 07/17/2017 07/01/2014 01/08/2014  Does Patient Have a Medical Advance Directive? Yes Yes Yes Yes Yes Patient has advance directive, copy not in chart  Type of Advance Directive Healthcare Power of [place]uf[place]orney;Living will Healthcare Power of [place]orney;Living will Healthcare Power of [place] ColonyAttorney;Living will Healthcare Power of [place]orney;Living will Living will Living will  Does patient want to make changes to medical advance directive? No - Patient declined No - Patient declined No - Patient declined - - -  Copy of Healthcare Power of Attorney in Chart? Yes - validated most recent copy scanned in chart (See row information) No - copy requested No - copy requested No - copy requested No - copy requested [facility]-    Hospital Utilization Over the Past 12 Months: # of hospitalizations or ER visits: 0 # of surgeries: 0  Review of Systems    Patient reports that her overall health is worse compared to last year.  History obtained from chart review and the patient  Pain Assessment Pain : 0-10 Pain Score: 3  Pain Type: Chronic pain Pain Location: Hip Pain Orientation: Right,Left Pain Descriptors / Indicators: Aching Pain Onset: Yesterday Pain Frequency: Intermittent Pain Relieving  Factors: Medication Effect of Pain on Daily Activities: none  Pain Relieving Factors: Medication  Current Medications & Allergies (verified) Allergies as of 07/22/2020      Reactions   Erythromycin Swelling   Penicillins Swelling, Rash, Hives      Medication List       Accurate as of July 22, 2020 10:16 AM. If you have any questions, ask your nurse or doctor.        albuterol 108 (90 Base) MCG/ACT inhaler Commonly known as: ProAir HFA inhale 2 puffs by mouth every 6 hours if needed   amLODipine 5 MG tablet Commonly known as: NORVASC Take 1 tablet (5 mg total) by mouth daily.   Anoro Ellipta 62.5-25 MCG/INH Aepb Generic drug: umeclidinium-vilanterol INHALE 1 PUFF BY MOUTH ONCE DAILY   atorvastatin 10 MG tablet Commonly known as: LIPITOR Take 1 tablet (10 mg total) by mouth at bedtime.   CALCIUM CITRATE + Young PO Take 1 tablet by mouth 2 (two) times daily.   losartan 100 MG tablet Commonly known as: COZAAR Take 1 tablet (100 mg total) by mouth daily.   Magnesium 200 MG Tabs Take 4 tablets by mouth daily.   MSM Powd Take 2 Scoops by mouth 2 (two) times daily as needed. 2 teaspoons BID PRN   NON FORMULARY Take 3 tablets by mouth daily. curamin for pain   PreserVision AREDS 2 Caps Take 1 tablet by mouth 2 (two) times daily.   VITAMIN B COMPLEX PO Take 1 tablet by mouth every other day.   vitamin C 500 MG tablet Commonly known as: ASCORBIC ACID Take 500 mg by mouth daily.   Vitamin D3  25 MCG (1000 UT) Caps Take 1 capsule by mouth daily.       History (reviewed): Past Medical History:  Diagnosis Date   Asthma, exercise induced    Difficulty hearing    History of parathyroid disease    HTN (hypertension)    Macular degeneration    Migraine headache without aura    OP (osteoporosis)    Problems related to lack of adequate sleep    Past Surgical History:  Procedure Laterality Date   ABDOMINAL HYSTERECTOMY     she thinks ovaries removed  as well.     Anterior acromionectomy  07/31/00   left   BREAST LUMPECTOMY  1990   CATARACT EXTRACTION  [id number]   PARATHYROIDECTOMY  09/06/04   Minimally invasive parathyroid surgery by Dr. Bonita Quin in [place], [place].   repair of torn rotator cuff  07/31/00   left, and 2007   TONSILLECTOMY     Family History  Problem Relation Age of Onset   Heart disease Father    Cancer Mother        tumors on parathyroid, uterine   Kidney disease Mother    Social History   Socioeconomic History   Marital status: Married    Spouse name: [name]   Number of children: 2   Years of education: 16   Highest education level: Bachelor's degree (e.g., BA, AB, BS)  Occupational History   Occupation: Advertising account executive: RETIRED    Comment: retired  Tobacco Use   Smoking status: Never Smoker   Smokeless tobacco: Never Used  Building services engineer Use: Never used  Substance and Sexual Activity   Alcohol use: No   Drug use: No   Sexual activity: Yes    Partners: Male  Other Topics Concern   Not on file  Social History Narrative   At 4 years of college. No regular exercise. No significant caffeine intake. She does not drive any more due to the eye treatments.   Social Determinants of Health   Financial Resource Strain: Low Risk    Difficulty of Paying Living Expenses: Not hard at all  Food Insecurity: No Food Insecurity   Worried About Programme researcher, broadcasting/film/video in the Last Year: Never true   Ran Out of Food in the Last Year: Never true  Transportation Needs: No Transportation Needs   Lack of Transportation (Medical): No   Lack of Transportation (Non-Medical): No  Physical Activity: Inactive   Days of Exercise per Week: 0 days   Minutes of Exercise per Session: 0 min  Stress: No Stress Concern Present   Feeling of Stress : Only a little  Social Connections: Press photographer of Communication with Friends and Family: More than three times a week    Frequency of Social Gatherings with Friends and Family: Twice a week   Attends Religious Services: More than 4 times per year   Active Member of [employer] or Organizations: Yes   Attends Engineer, structural: More than 4 times per year   Marital Status: Married    Activities of Daily Living In your present state of health, do you have any difficulty performing the following activities: 07/22/2020  Hearing? N  Vision? Y  Comment currently under treatment with opthamalogy  Difficulty concentrating or making decisions? N  Walking or climbing stairs? N  Dressing or bathing? N  Doing errands, shopping? Y  Comment due to eye treatments; coordinates everything with her husbands schedule.  Preparing Food and eating ? N  Using the Toilet? N  In the past six months, have you accidently leaked urine? N  Do you have problems with loss of bowel control? N  Managing your Medications? N  Managing your Finances? N  Housekeeping or managing your Housekeeping? N  Some recent data might be hidden    Patient Education/ Literacy How often do you need to have someone help you when you read instructions, pamphlets, or other written materials from your doctor or pharmacy?: 1 - Never What is the last grade level you completed in school?: Bachelors degree  Exercise Current Exercise Habits: The patient does not participate in regular exercise at present, Exercise limited by: None identified  Diet Patient reports consuming 3 meals a day and 1 snack(s) a day Patient reports that her primary diet is: Regular Patient reports that she does have regular access to food.   Review of Systems    Cardiac Risk Factors include: advanced age (>22men, >60 women);hypertension     Objective:    Today's Vitals   07/22/20 0933 07/22/20 0936  BP: (!) 168/72   Pulse: 60   Resp: 17   Temp: 98.1 F (36.7 C)   SpO2: 98%   Weight: 127 lb (57.6 kg)   Height: 5\' 4"  (1.626 m)   PainSc: 3  3   PainLoc:  Hip    Body mass index is 21.8 kg/m.  Advanced Directives 07/22/2020 07/22/2019 07/18/2018 07/17/2017 07/01/2014 01/08/2014  Does Patient Have a Medical Advance Directive? Yes Yes Yes Yes Yes Patient has advance directive, copy not in chart  Type of Advance Directive Healthcare Power of [place];Living will Healthcare Power of [place];Living will Healthcare Power of [place];Living will Healthcare Power of [place];Living will Living will Living will  Does patient want to make changes to medical advance directive? No - Patient declined No - Patient declined No - Patient declined - - -  Copy of Healthcare Power of Attorney in Chart? Yes - validated most recent copy scanned in chart (See row information) No - copy requested No - copy requested No - copy requested No - copy requested -    Current Medications (verified) Outpatient Encounter Medications as of 07/22/2020  Medication Sig   albuterol (PROAIR HFA) 108 (90 Base) MCG/ACT inhaler inhale 2 puffs by mouth every 6 hours if needed   amLODipine (NORVASC) 5 MG tablet Take 1 tablet (5 mg total) by mouth daily.   atorvastatin (LIPITOR) 10 MG tablet Take 1 tablet (10 mg total) by mouth at bedtime.   B Complex Vitamins (VITAMIN B COMPLEX PO) Take 1 tablet by mouth every other day.   Calcium Citrate-Vitamin Young (CALCIUM CITRATE + Young PO) Take 1 tablet by mouth 2 (two) times daily.   Cholecalciferol (VITAMIN D3) 1000 units CAPS Take 1 capsule by mouth daily.   losartan (COZAAR) 100 MG tablet Take 1 tablet (100 mg total) by mouth daily.   Magnesium 200 MG TABS Take 4 tablets by mouth daily.   Methylsulfonylmethane (MSM) POWD Take 2 Scoops by mouth 2 (two) times daily as needed. 2 teaspoons BID PRN   Multiple Vitamins-Minerals (PRESERVISION AREDS 2) CAPS Take 1 tablet by mouth 2 (two) times daily.   NON FORMULARY Take 3 tablets by mouth daily. curamin for pain   umeclidinium-vilanterol (ANORO ELLIPTA) 62.5-25 MCG/INH AEPB INHALE 1 PUFF BY  MOUTH ONCE DAILY   vitamin C (ASCORBIC ACID) 500 MG tablet Take 500 mg by mouth daily.   No facility-administered encounter  medications on file as of 07/22/2020.    Allergies (verified) Erythromycin and Penicillins   History: Past Medical History:  Diagnosis Date   Asthma, exercise induced    Difficulty hearing    History of parathyroid disease    HTN (hypertension)    Macular degeneration    Migraine headache without aura    OP (osteoporosis)    Problems related to lack of adequate sleep    Past Surgical History:  Procedure Laterality Date   ABDOMINAL HYSTERECTOMY     she thinks ovaries removed as well.     Anterior acromionectomy  07/31/00   left   BREAST LUMPECTOMY  1990   CATARACT EXTRACTION  [id number]   PARATHYROIDECTOMY  09/06/04   Minimally invasive parathyroid surgery by Dr. Charise Killian in [place], [place].   repair of torn rotator cuff  07/31/00   left, and 2007   TONSILLECTOMY     Family History  Problem Relation Age of Onset   Heart disease Father    Cancer Mother        tumors on parathyroid, uterine   Kidney disease Mother    Social History   Socioeconomic History   Marital status: Married    Spouse name: [name]   Number of children: 2   Years of education: 16   Highest education level: Bachelor's degree (e.g., BA, AB, BS)  Occupational History   Occupation: Games developer: RETIRED    Comment: retired  Tobacco Use   Smoking status: Never Smoker   Smokeless tobacco: Never Used  Scientific laboratory technician Use: Never used  Substance and Sexual Activity   Alcohol use: No   Drug use: No   Sexual activity: Yes    Partners: Male  Other Topics Concern   Not on file  Social History Narrative   At 4 years of college. No regular exercise. No significant caffeine intake. She does not drive any more due to the eye treatments.   Social Determinants of Health   Financial Resource Strain: Low Risk    Difficulty of  Paying Living Expenses: Not hard at all  Food Insecurity: No Food Insecurity   Worried About Charity fundraiser in the Last Year: Never true   [place] in the Last Year: Never true  Transportation Needs: No Transportation Needs   Lack of Transportation (Medical): No   Lack of Transportation (Non-Medical): No  Physical Activity: Inactive   Days of Exercise per Week: 0 days   Minutes of Exercise per Session: 0 min  Stress: No Stress Concern Present   Feeling of Stress : Only a little  Social Connections: Engineer, building services of Communication with Friends and Family: More than three times a week   Frequency of Social Gatherings with Friends and Family: Twice a week   Attends Religious Services: More than 4 times per year   Active Member of [employer] or Organizations: Yes   Attends Music therapist: More than 4 times per year   Marital Status: Married    Tobacco Counseling Counseling given: Not Answered   Clinical Intake:  Pre-visit preparation completed: Yes  Pain : 0-10 Pain Score: 3  Pain Type: Chronic pain Pain Location: Hip Pain Orientation: Right,Left Pain Descriptors / Indicators: Aching Pain Onset: Yesterday Pain Frequency: Intermittent Pain Relieving Factors: Medication Effect of Pain on Daily Activities: none  Pain Relieving Factors: Medication  BMI - recorded: 21.8 Nutritional Status: BMI of 19-24  Normal Nutritional Risks: None Diabetes: No  How often do you need to have someone help you when you read instructions, pamphlets, or other written materials from your doctor or pharmacy?: 1 - Never What is the last grade level you completed in school?: Bachelors degree  Diabetic? No  Interpreter Needed?: No  Information entered by :: Modesto Charon, RN   Activities of Daily Living In your present state of health, do you have any difficulty performing the following activities: 07/22/2020  Hearing? N  Vision? Y   Comment currently under treatment with opthamalogy  Difficulty concentrating or making decisions? N  Walking or climbing stairs? N  Dressing or bathing? N  Doing errands, shopping? Y  Comment due to eye treatments; coordinates everything with her husbands schedule.  Preparing Food and eating ? N  Using the Toilet? N  In the past six months, have you accidently leaked urine? N  Do you have problems with loss of bowel control? N  Managing your Medications? N  Managing your Finances? N  Housekeeping or managing your Housekeeping? N  Some recent data might be hidden    Patient Care Team: Cheryl Games, MD as PCP - General (Family Medicine) Cheryl Nones, OD (Optometry) Cheryl Stall, MD as Attending Physician (Dermatology) Cheryl Reading, MD as Attending Physician (Otolaryngology) Cheryl Shines, DC (Chiropractic Medicine)  Indicate any recent Medical Services you may have received from other than [facility] providers in the past year (date may be approximate).     Assessment:   This is a routine wellness examination for [name].  Hearing/Vision screen No exam data present  Dietary issues and exercise activities discussed: Current Exercise Habits: The patient does not participate in regular exercise at present, Exercise limited by: None identified  Goals     Exercise 3x per week (30 min per time)     Try to start walking at least 3 times a week for 30 minutes at a time.     Patient Stated     To stay active and keep walking without a cane or walker.      Depression Screen PHQ 2/9 Scores 07/22/2020 07/07/2020 07/22/2019 07/08/2019 01/01/2019 07/18/2018 01/15/2018  PHQ - 2 Score 1 1 1  0 0 0 0  PHQ- 9 Score - - - - - - -    Fall Risk Fall Risk  07/22/2020 07/22/2019 07/08/2019 07/18/2018 01/15/2018  Falls in the past year? 0 0 0 0 No  Number falls in past yr: 0 0 0 - -  Injury with Fall? 0 0 0 - -  Risk for fall due to : Impaired vision - - Impaired balance/gait -  Follow  up Falls evaluation completed;Education provided;Falls prevention discussed Falls prevention discussed - (No Data) -  Comment - - - discussed fall risk -    FALL RISK PREVENTION PERTAINING TO THE HOME:  Any stairs in or around the home? Yes  If so, are there any without handrails? Yes  Home free of loose throw rugs in walkways, pet beds, electrical cords, etc? Yes  Adequate lighting in your home to reduce risk of falls? Yes     6CIT Screen 07/22/2020 07/22/2019 07/18/2018 07/17/2017 07/13/2016  What Year? 0 points 0 points 0 points 0 points 0 points  What month? 0 points 0 points 0 points 0 points 0 points  What time? 0 points 0 points 0 points 0 points 0 points  Count back from 20 0 points 0 points 0 points 0 points 0  points  Months in reverse 0 points 0 points 2 points 0 points 0 points  Repeat phrase 0 points 2 points 2 points 0 points 2 points  Total Score 0 2 4 0 2    Immunizations Immunization History  Administered Date(s) Administered   Fluad Quad(high Dose 65+) 06/06/2020   Influenza Split 05/07/2012   Influenza, High Dose Seasonal PF 05/28/2013, 05/02/2017, 05/07/2018, 05/18/2019   Influenza,inj,Quad PF,6+ Mos 04/23/2014, 05/14/2015   Influenza-Unspecified 05/09/2016   PFIZER SARS-COV-2 Vaccination 08/21/2019, 09/11/2019, 06/02/2020   Pneumococcal Conjugate-13 07/01/2014   Pneumococcal Polysaccharide-23 08/01/2005   Td 08/01/2001   Tdap 08/01/2005, 10/07/2015    TDAP status: Up to date  Flu Vaccine status: Up to date  Pneumococcal vaccine status: Up to date  Covid-19 vaccine status: Completed vaccines  Qualifies for Shingles Vaccine? Yes   Zostavax completed Yes   Shingrix Completed?: Yes  Screening Tests Health Maintenance  Topic Date Due   COVID-19 Vaccine (4 - Booster for Pfizer series) 11/30/2020   DEXA SCAN  07/16/2021   TETANUS/TDAP  10/06/2025   INFLUENZA VACCINE  Completed   PNA vac Low Risk Adult  Completed    Health  Maintenance  There are no preventive care reminders to display for this patient.  Colorectal cancer screening: No longer required.   Bone Density status: Completed 2020. Results reflect: Bone density results: OSTEOPOROSIS. Repeat every 2 years.  Lung Cancer Screening: (Low Dose CT Chest recommended if Age 15-80 years, 30 pack-year currently smoking OR have quit [place]/in 15years.) does not qualify.   Lung Cancer Screening Referral:   Additional Screening:  Hepatitis C Screening: does not qualify.  Vision Screening: Recommended annual ophthalmology exams for early detection of glaucoma and other disorders of the eye. Is the patient up to date with their annual eye exam?  Yes  Who is the provider or what is the name of the office in which the patient attends annual eye exams? Dr. Yvette Rack; she is also under treatment for her eyes with Dr. Baird Cancer. If pt is not established with a provider, would they like to be referred to a provider to establish care? No .   Dental Screening: Recommended annual dental exams for proper oral hygiene  Community Resource Referral / Chronic Care Management: CRR required this visit?  No   CCM required this visit?  No      Plan:     I have personally reviewed and noted the following in the patients chart:    Medical and social history  Use of alcohol, tobacco or illicit drugs   Current medications and supplements  Functional ability and status  Nutritional status  Physical activity  Advanced directives  List of other physicians  Hospitalizations, surgeries, and ER visits in previous 12 months  Vitals  Screenings to include cognitive, depression, and falls  Referrals and appointments  In addition, I have reviewed and discussed with patient certain preventive protocols, quality metrics, and best practice recommendations. A written personalized care plan for preventive services as well as general preventive health recommendations were provided  to patient.     Tinnie Gens, RN   07/22/2020

## 2020-07-22 NOTE — Patient Instructions (Addendum)
07/22/2020 AWV Goal: Fall Prevention  . Over the next year, patient will decrease their risk for falls by: o Using assistive devices, such as a cane or walker, as needed o Identifying fall risks within their home and correcting them by: - Removing throw rugs - Adding handrails to stairs or ramps - Removing clutter and keeping a clear pathway throughout the home - Increasing light, especially at night - Adding shower handles/bars - Raising toilet seat o Identifying potential personal risk factors for falls: - Medication side effects - Incontinence/urgency - Vestibular dysfunction - Hearing loss - Musculoskeletal disorders - Neurological disorders - Orthostatic hypotension    Fall Prevention in the Home, Adult Falls can cause injuries. They can happen to people of all ages. There are many things you can do to make your home safe and to help prevent falls. Ask for help when making these changes, if needed. What actions can I take to prevent falls? General Instructions  Use good lighting in all rooms. Replace any light bulbs that burn out.  Turn on the lights when you go into a dark area. Use night-lights.  Keep items that you use often in easy-to-reach places. Lower the shelves around your home if necessary.  Set up your furniture so you have a clear path. Avoid moving your furniture around.  Do not have throw rugs and other things on the floor that can make you trip.  Avoid walking on wet floors.  If any of your floors are uneven, fix them.  Add color or contrast paint or tape to clearly mark and help you see: ? Any grab bars or handrails. ? First and last steps of stairways. ? Where the edge of each step is.  If you use a stepladder: ? Make sure that it is fully opened. Do not climb a closed stepladder. ? Make sure that both sides of the stepladder are locked into place. ? Ask someone to hold the stepladder for you while you use it.  If there are any pets around  you, be aware of where they are. What can I do in the bathroom?      Keep the floor dry. Clean up any water that spills onto the floor as soon as it happens.  Remove soap buildup in the tub or shower regularly.  Use non-skid mats or decals on the floor of the tub or shower.  Attach bath mats securely with double-sided, non-slip rug tape.  If you need to sit down in the shower, use a plastic, non-slip stool.  Install grab bars by the toilet and in the tub and shower. Do not use towel bars as grab bars. What can I do in the bedroom?  Make sure that you have a light by your bed that is easy to reach.  Do not use any sheets or blankets that are too big for your bed. They should not hang down onto the floor.  Have a firm chair that has side arms. You can use this for support while you get dressed. What can I do in the kitchen?  Clean up any spills right away.  If you need to reach something above you, use a strong step stool that has a grab bar.  Keep electrical cords out of the way.  Do not use floor polish or wax that makes floors slippery. If you must use wax, use non-skid floor wax. What can I do with my stairs?  Do not leave any items on the stairs.  Make sure that you have a light switch at the top of the stairs and the bottom of the stairs. If you do not have them, ask someone to add them for you.  Make sure that there are handrails on both sides of the stairs, and use them. Fix handrails that are broken or loose. Make sure that handrails are as long as the stairways.  Install non-slip stair treads on all stairs in your home.  Avoid having throw rugs at the top or bottom of the stairs. If you do have throw rugs, attach them to the floor with carpet tape.  Choose a carpet that does not hide the edge of the steps on the stairway.  Check any carpeting to make sure that it is firmly attached to the stairs. Fix any carpet that is loose or worn. What can I do on the  outside of my home?  Use bright outdoor lighting.  Regularly fix the edges of walkways and driveways and fix any cracks.  Remove anything that might make you trip as you walk through a door, such as a raised step or threshold.  Trim any bushes or trees on the path to your home.  Regularly check to see if handrails are loose or broken. Make sure that both sides of any steps have handrails.  Install guardrails along the edges of any raised decks and porches.  Clear walking paths of anything that might make someone trip, such as tools or rocks.  Have any leaves, snow, or ice cleared regularly.  Use sand or salt on walking paths during winter.  Clean up any spills in your garage right away. This includes grease or oil spills. What other actions can I take?  Wear shoes that: ? Have a low heel. Do not wear high heels. ? Have rubber bottoms. ? Are comfortable and fit you well. ? Are closed at the toe. Do not wear open-toe sandals.  Use tools that help you move around (mobility aids) if they are needed. These include: ? Canes. ? Walkers. ? Scooters. ? Crutches.  Review your medicines with your doctor. Some medicines can make you feel dizzy. This can increase your chance of falling. Ask your doctor what other things you can do to help prevent falls. Where to find more information  Centers for Disease Control and Prevention, STEADI: https://garcia.biz/  [employer] on Aging: BrainJudge.co.uk Contact a doctor if:  You are afraid of falling at home.  You feel weak, drowsy, or dizzy at home.  You fall at home. Summary  There are many simple things that you can do to make your home safe and to help prevent falls.  Ways to make your home safe include removing tripping hazards and installing grab bars in the bathroom.  Ask for help when making these changes in your home. This information is not intended to replace advice given to you by your health care provider.  Make sure you discuss any questions you have with your health care provider. Document Revised: 11/08/2018 Document Reviewed: 03/02/2017 Elsevier Patient Education  2020 [employer] Maintenance, Female Adopting a healthy lifestyle and getting preventive care are important in promoting health and wellness. Ask your health care provider about:  The right schedule for you to have regular tests and exams.  Things you can do on your own to prevent diseases and keep yourself healthy. What should I know about diet, weight, and exercise? Eat a healthy diet   Eat a diet that  includes plenty of vegetables, fruits, low-fat dairy products, and lean protein.  Do not eat a lot of foods that are high in solid fats, added sugars, or sodium. Maintain a healthy weight Body mass index (BMI) is used to identify weight problems. It estimates body fat based on height and weight. Your health care provider can help determine your BMI and help you achieve or maintain a healthy weight. Get regular exercise Get regular exercise. This is one of the most important things you can do for your health. Most adults should:  Exercise for at least 150 minutes each week. The exercise should increase your heart rate and make you sweat (moderate-intensity exercise).  Do strengthening exercises at least twice a week. This is in addition to the moderate-intensity exercise.  Spend less time sitting. Even light physical activity can be beneficial. Watch cholesterol and blood lipids Have your blood tested for lipids and cholesterol at 84 years of age, then have this test every 5 years. Have your cholesterol levels checked more often if:  Your lipid or cholesterol levels are high.  You are older than 84 years of age.  You are at high risk for heart disease. What should I know about cancer screening? Depending on your health history and family history, you may need to have cancer screening at various ages. This  may include screening for:  Breast cancer.  Cervical cancer.  Colorectal cancer.  Skin cancer.  Lung cancer. What should I know about heart disease, diabetes, and high blood pressure? Blood pressure and heart disease  High blood pressure causes heart disease and increases the risk of stroke. This is more likely to develop in people who have high blood pressure readings, are of African descent, or are overweight.  Have your blood pressure checked: ? Every 3-5 years if you are 6-4 years of age. ? Every year if you are 55 years old or older. Diabetes Have regular diabetes screenings. This checks your fasting blood sugar level. Have the screening done:  Once every three years after age 63 if you are at a normal weight and have a low risk for diabetes.  More often and at a younger age if you are overweight or have a high risk for diabetes. What should I know about preventing infection? Hepatitis B If you have a higher risk for hepatitis B, you should be screened for this virus. Talk with your health care provider to find out if you are at risk for hepatitis B infection. Hepatitis C Testing is recommended for:  Everyone born from [birth year] through 1963.  Anyone with known risk factors for hepatitis C. Sexually transmitted infections (STIs)  Get screened for STIs, including gonorrhea and chlamydia, if: ? You are sexually active and are younger than 84 years of age. ? You are older than 84 years of age and your health care provider tells you that you are at risk for this type of infection. ? Your sexual activity has changed since you were last screened, and you are at increased risk for chlamydia or gonorrhea. Ask your health care provider if you are at risk.  Ask your health care provider about whether you are at high risk for HIV. Your health care provider may recommend a prescription medicine to help prevent HIV infection. If you choose to take medicine to prevent HIV, you should  first get tested for HIV. You should then be tested every 3 months for as long as you are taking the medicine. Pregnancy  If  you are about to stop having your period (premenopausal) and you may become pregnant, seek counseling before you get pregnant.  Take 400 to 800 micrograms (mcg) of folic acid every day if you become pregnant.  Ask for birth control (contraception) if you want to prevent pregnancy. Osteoporosis and menopause Osteoporosis is a disease in which the bones lose minerals and strength with aging. This can result in bone fractures. If you are 38 years old or older, or if you are at risk for osteoporosis and fractures, ask your health care provider if you should:  Be screened for bone loss.  Take a calcium or vitamin D supplement to lower your risk of fractures.  Be given hormone replacement therapy (HRT) to treat symptoms of menopause. Follow these instructions at home: Lifestyle  Do not use any products that contain nicotine or tobacco, such as cigarettes, e-cigarettes, and chewing tobacco. If you need help quitting, ask your health care provider.  Do not use street drugs.  Do not share needles.  Ask your health care provider for help if you need support or information about quitting drugs. Alcohol use  Do not drink alcohol if: ? Your health care provider tells you not to drink. ? You are pregnant, may be pregnant, or are planning to become pregnant.  If you drink alcohol: ? Limit how much you use to 0-1 drink a day. ? Limit intake if you are breastfeeding.  Be aware of how much alcohol is in your drink. In the [place] one drink equals one 12 oz bottle of beer (355 mL), one 5 oz glass of wine (148 mL), or one 1 oz glass of hard liquor (44 mL). General instructions  Schedule regular health, dental, and eye exams.  Stay current with your vaccines.  Tell your health care provider if: ? You often feel depressed. ? You have ever been abused or do not feel safe  at home. Summary  Adopting a healthy lifestyle and getting preventive care are important in promoting health and wellness.  Follow your health care provider's instructions about healthy diet, exercising, and getting tested or screened for diseases.  Follow your health care provider's instructions on monitoring your cholesterol and blood pressure. This information is not intended to replace advice given to you by your health care provider. Make sure you discuss any questions you have with your health care provider. Document Revised: 07/11/2018 Document Reviewed: 07/11/2018 Elsevier Patient Education  2020 [employer].

## 2020-08-05 DIAGNOSIS — H353231 Exudative age-related macular degeneration, bilateral, with active choroidal neovascularization: Secondary | ICD-10-CM | POA: Diagnosis not present

## 2020-08-12 ENCOUNTER — Telehealth: Payer: Self-pay

## 2020-08-12 NOTE — Telephone Encounter (Signed)
[name] states she has noticed mild shortness of breath and muscle aches. She believes this is all side effects of the atorvastatin. She states she these symptoms are only mild. She doesn't think she needs to come in for a visit.

## 2020-08-12 NOTE — Telephone Encounter (Signed)
Patient advised.

## 2020-08-12 NOTE — Telephone Encounter (Signed)
Please let her know that shortness of breath is not a typical side effect so she may need to use her albuterol and if she feels like she is getting worse or having a flare with her breathing to please let us know.  It certainly could be causing some muscle aches so she can always decrease her dose to every other night if she would like to see if that is helpful.  But again I am a little concerned that she might be coming down with something and to let us know immediately if she feels that she is getting sick.

## 2020-09-02 DIAGNOSIS — H353231 Exudative age-related macular degeneration, bilateral, with active choroidal neovascularization: Secondary | ICD-10-CM | POA: Diagnosis not present

## 2020-09-02 DIAGNOSIS — H35371 Puckering of macula, right eye: Secondary | ICD-10-CM | POA: Diagnosis not present

## 2020-09-02 DIAGNOSIS — H43813 Vitreous degeneration, bilateral: Secondary | ICD-10-CM | POA: Diagnosis not present

## 2020-09-02 DIAGNOSIS — D3132 Benign neoplasm of left choroid: Secondary | ICD-10-CM | POA: Diagnosis not present

## 2020-09-21 ENCOUNTER — Encounter: Payer: Self-pay | Admitting: Family Medicine

## 2020-09-21 ENCOUNTER — Other Ambulatory Visit: Payer: Self-pay

## 2020-09-21 ENCOUNTER — Ambulatory Visit (INDEPENDENT_AMBULATORY_CARE_PROVIDER_SITE_OTHER): Payer: Medicare HMO | Admitting: Family Medicine

## 2020-09-21 VITALS — BP 118/65 | HR 62 | Ht 64.0 in | Wt 128.0 lb

## 2020-09-21 DIAGNOSIS — J449 Chronic obstructive pulmonary disease, unspecified: Secondary | ICD-10-CM

## 2020-09-21 DIAGNOSIS — Z79899 Other long term (current) drug therapy: Secondary | ICD-10-CM | POA: Diagnosis not present

## 2020-09-21 DIAGNOSIS — I10 Essential (primary) hypertension: Secondary | ICD-10-CM | POA: Diagnosis not present

## 2020-09-21 DIAGNOSIS — I700 Atherosclerosis of aorta: Secondary | ICD-10-CM

## 2020-09-21 LAB — COMPLETE METABOLIC PANEL WITH GFR
AG Ratio: 2 (calc) (ref 1.0–2.5)
ALT: 18 U/L (ref 6–29)
AST: 19 U/L (ref 10–35)
Albumin: 4.3 g/dL (ref 3.6–5.1)
Alkaline phosphatase (APISO): 84 U/L (ref 37–153)
BUN/Creatinine Ratio: 20 (calc) (ref 6–22)
BUN: 18 mg/dL (ref 7–25)
CO2: 29 mmol/L (ref 20–32)
Calcium: 10.1 mg/dL (ref 8.6–10.4)
Chloride: 108 mmol/L (ref 98–110)
Creat: 0.92 mg/dL — ABNORMAL HIGH (ref 0.60–0.88)
GFR, Est African American: 66 mL/min/{1.73_m2} (ref >=60)
GFR, Est Non African American: 57 mL/min/{1.73_m2} — ABNORMAL LOW (ref >=60)
Globulin: 2.2 g/dL (calc) (ref 1.9–3.7)
Glucose, Bld: 90 mg/dL (ref 65–99)
Potassium: 4.3 mmol/L (ref 3.5–5.3)
Sodium: 144 mmol/L (ref 135–146)
Total Bilirubin: 0.9 mg/dL (ref 0.2–1.2)
Total Protein: 6.5 g/dL (ref 6.1–8.1)

## 2020-09-21 LAB — LIPID PANEL W/REFLEX DIRECT LDL
Cholesterol: 156 mg/dL (ref <200)
HDL: 66 mg/dL (ref >=50)
LDL Cholesterol (Calc): 75 mg/dL (calc)
Non-HDL Cholesterol (Calc): 90 mg/dL (calc) (ref <130)
Total CHOL/HDL Ratio: 2.4 (calc) (ref <5.0)
Triglycerides: 69 mg/dL (ref <150)

## 2020-09-21 NOTE — Assessment & Plan Note (Signed)
Controlled on current regimen.  Only had to use her rescue medication once in the last 2 months.

## 2020-09-21 NOTE — Assessment & Plan Note (Signed)
Continue daily statin.  Due to recheck lipids and liver enzymes.

## 2020-09-21 NOTE — Progress Notes (Signed)
Established Patient Office Visit  Subjective:  Patient ID: [name], female    DOB: January 30, 1933  Age: 87 y.o. MRN: [id number]  CC:  Chief Complaint  Patient presents with  . Hypertension    HPI [name] presents for   Hyperlipidemia.  She recently started atorvastatin.  In fact she had called in January because she was having some shortness of breath and wondered if it was a side effect from the medication which it was not.  Did have a few days where she felt bad and could not quite pinpoint what was going on in fact she had actually called here in January and said she was having some shortness of breath.  We reassured her that that was not from the statin.  She did go down to every other day for a few days on her medication but then eventually went back up to once a day and she has been doing well with it since then.  Hypertension- Pt denies chest pain, SOB, dizziness, or heart palpitations.  Taking meds as directed w/o problems.  Denies medication side effects.    COPD-doing well overall.  She says she is really only had to use her rescue medication once in the last 2 months.  But she does carry it with her for emergencies.  She does feel like her arthritis in general has been a little bit worse but she is waiting until the spring she is hoping that the warmer weather will help.  She says it is usually better when she is up and moving but then sometimes she does not overdoes it and pays for it the next day.  Past Medical History:  Diagnosis Date  . Asthma, exercise induced   . Difficulty hearing   . History of parathyroid disease   . HTN (hypertension)   . Macular degeneration   . Migraine headache without aura   . OP (osteoporosis)   . Problems related to lack of adequate sleep     Past Surgical History:  Procedure Laterality Date  . ABDOMINAL HYSTERECTOMY     she thinks ovaries removed as well.    Marland Kitchen Anterior acromionectomy  07/31/00   left  . BREAST LUMPECTOMY   1990  . CATARACT EXTRACTION  [id number]  . PARATHYROIDECTOMY  09/06/04   Minimally invasive parathyroid surgery by Dr. Charise Killian in [place], [place].  . repair of torn rotator cuff  07/31/00   left, and 2007  . TONSILLECTOMY      Family History  Problem Relation Age of Onset  . Heart disease Father   . Cancer Mother        tumors on parathyroid, uterine  . Kidney disease Mother     Social History   Socioeconomic History  . Marital status: Married    Spouse name: [name]  . Number of children: 2  . Years of education: 16  . Highest education level: Bachelor's degree (e.g., BA, AB, BS)  Occupational History  . Occupation: Games developer: RETIRED    Comment: retired  Tobacco Use  . Smoking status: Never Smoker  . Smokeless tobacco: Never Used  Vaping Use  . Vaping Use: Never used  Substance and Sexual Activity  . Alcohol use: No  . Drug use: No  . Sexual activity: Yes    Partners: Male  Other Topics Concern  . Not on file  Social History Narrative   At 4 years of college. No regular exercise. No  significant caffeine intake. She does not drive any more due to the eye treatments.   Social Determinants of Health   Financial Resource Strain: Low Risk   . Difficulty of Paying Living Expenses: Not hard at all  Food Insecurity: No Food Insecurity  . Worried About Charity fundraiser in the Last Year: Never true  . Ran Out of Food in the Last Year: Never true  Transportation Needs: No Transportation Needs  . Lack of Transportation (Medical): No  . Lack of Transportation (Non-Medical): No  Physical Activity: Inactive  . Days of Exercise per Week: 0 days  . Minutes of Exercise per Session: 0 min  Stress: No Stress Concern Present  . Feeling of Stress : Only a little  Social Connections: Socially Integrated  . Frequency of Communication with Friends and Family: More than three times a week  . Frequency of Social Gatherings with Friends and Family: Twice a week   . Attends Religious Services: More than 4 times per year  . Active Member of Clubs or Organizations: Yes  . Attends Archivist Meetings: More than 4 times per year  . Marital Status: Married  Human resources officer Violence: Not At Risk  . Fear of Current or Ex-Partner: No  . Emotionally Abused: No  . Physically Abused: No  . Sexually Abused: No    Outpatient Medications Prior to Visit  Medication Sig Dispense Refill  . albuterol (PROAIR HFA) 108 (90 Base) MCG/ACT inhaler inhale 2 puffs by mouth every 6 hours if needed 25.5 g prn  . amLODipine (NORVASC) 5 MG tablet Take 1 tablet (5 mg total) by mouth daily. 90 tablet 1  . atorvastatin (LIPITOR) 10 MG tablet Take 1 tablet (10 mg total) by mouth at bedtime. 90 tablet 1  . B Complex Vitamins (VITAMIN B COMPLEX PO) Take 1 tablet by mouth every other day.    . Calcium Citrate-Vitamin D (CALCIUM CITRATE + D PO) Take 1 tablet by mouth 2 (two) times daily.    . Cholecalciferol (VITAMIN D3) 1000 units CAPS Take 1 capsule by mouth daily.    Marland Kitchen losartan (COZAAR) 100 MG tablet Take 1 tablet (100 mg total) by mouth daily. 90 tablet 1  . Magnesium 200 MG TABS Take 4 tablets by mouth daily.    Marland Kitchen Methylsulfonylmethane (MSM) POWD Take 2 Scoops by mouth 2 (two) times daily as needed. 2 teaspoons BID PRN    . Multiple Vitamins-Minerals (PRESERVISION AREDS 2) CAPS Take 1 tablet by mouth 2 (two) times daily.    . NON FORMULARY Take 3 tablets by mouth daily. curamin for pain    . umeclidinium-vilanterol (ANORO ELLIPTA) 62.5-25 MCG/INH AEPB INHALE 1 PUFF BY MOUTH ONCE DAILY 60 each 5  . vitamin C (ASCORBIC ACID) 500 MG tablet Take 500 mg by mouth daily.     No facility-administered medications prior to visit.    Allergies  Allergen Reactions  . Erythromycin Swelling  . Penicillins Swelling, Rash and Hives  . Statins Other (See Comments)    Muscle aches    ROS Review of Systems    Objective:    Physical Exam Constitutional:       Appearance: She is well-developed and well-nourished.  HENT:     Head: Normocephalic and atraumatic.  Cardiovascular:     Rate and Rhythm: Normal rate and regular rhythm.     Heart sounds: Normal heart sounds.  Pulmonary:     Effort: Pulmonary effort is normal.     Breath  sounds: Normal breath sounds.  Skin:    General: Skin is warm and dry.  Neurological:     Mental Status: She is alert and oriented to person, place, and time.  Psychiatric:        Mood and Affect: Mood and affect normal.        Behavior: Behavior normal.     BP 118/65   Pulse 62   Ht 5\' 4"  (1.626 m)   Wt 128 lb (58.1 kg)   SpO2 98%   BMI 21.97 kg/m  Wt Readings from Last 3 Encounters:  09/21/20 128 lb (58.1 kg)  07/22/20 127 lb (57.6 kg)  07/07/20 125 lb (56.7 kg)     There are no preventive care reminders to display for this patient.  There are no preventive care reminders to display for this patient.  Lab Results  Component Value Date   TSH 1.520 07/01/2014   Lab Results  Component Value Date   WBC 5.8 06/15/2012   HGB 14.4 06/15/2012   HCT 43.1 06/15/2012   MCV 88.5 06/15/2012   PLT 258 06/15/2012   Lab Results  Component Value Date   NA 142 07/07/2020   K 4.3 07/07/2020   CO2 28 07/07/2020   GLUCOSE 90 07/07/2020   BUN 19 07/07/2020   CREATININE 0.78 07/07/2020   BILITOT 0.6 07/07/2020   ALKPHOS 79 07/13/2016   AST 20 07/07/2020   ALT 19 07/07/2020   PROT 6.4 07/07/2020   ALBUMIN 4.2 07/13/2016   CALCIUM 9.6 07/07/2020   Lab Results  Component Value Date   CHOL 227 (H) 07/07/2020   Lab Results  Component Value Date   HDL 70 07/07/2020   Lab Results  Component Value Date   LDLCALC 139 (H) 07/07/2020   Lab Results  Component Value Date   TRIG 81 07/07/2020   Lab Results  Component Value Date   CHOLHDL 3.2 07/07/2020   No results found for: HGBA1C    Assessment & Plan:   Problem List Items Addressed This Visit      Cardiovascular and Mediastinum    Essential hypertension - Primary    BP is elevated today.       Aortic atherosclerosis (HCC)    Continue daily statin.  Due to recheck lipids and liver enzymes.      Relevant Orders   COMPLETE METABOLIC PANEL WITH GFR   Lipid Panel w/reflex Direct LDL     Respiratory   COPD (chronic obstructive pulmonary disease) (HCC)    Controlled on current regimen.  Only had to use her rescue medication once in the last 2 months.       Other Visit Diagnoses    Medication management       Relevant Orders   COMPLETE METABOLIC PANEL WITH GFR   Lipid Panel w/reflex Direct LDL      No orders of the defined types were placed in this encounter.   Follow-up: No follow-ups on file.    Beatrice Lecher, MD

## 2020-09-21 NOTE — Assessment & Plan Note (Signed)
BP is elevated today;

## 2020-10-28 DIAGNOSIS — H35371 Puckering of macula, right eye: Secondary | ICD-10-CM | POA: Diagnosis not present

## 2020-10-28 DIAGNOSIS — H353231 Exudative age-related macular degeneration, bilateral, with active choroidal neovascularization: Secondary | ICD-10-CM | POA: Diagnosis not present

## 2020-10-28 DIAGNOSIS — H43813 Vitreous degeneration, bilateral: Secondary | ICD-10-CM | POA: Diagnosis not present

## 2020-12-14 ENCOUNTER — Telehealth: Payer: Self-pay | Admitting: Family Medicine

## 2020-12-14 NOTE — Chronic Care Management (AMB) (Signed)
  Chronic Care Management   Outreach Note  12/14/2020 Name: [name] MRN: [id number] DOB: 1932-12-11  Referred by: [name], MD Reason for referral : Chronic Care Management   An unsuccessful telephone outreach was attempted today. The patient was referred to the pharmacist for assistance with care management and care coordination.   Follow Up Plan:   Lauretta Grill Upstream Scheduler

## 2020-12-15 ENCOUNTER — Telehealth: Payer: Self-pay | Admitting: Family Medicine

## 2020-12-15 NOTE — Chronic Care Management (AMB) (Signed)
  Chronic Care Management   Note  12/15/2020 Name: [name] MRN: [id number] DOB: Nov 02, 1932  [name] is a 87 y.o. year old female who is a primary care patient of Metheney, Rene Kocher, MD. I reached out to [name] by phone today in response to a referral sent by [name]'s PCP, [name], MD.   [name] was given information about Chronic Care Management services today including:  1. CCM service includes personalized support from designated clinical staff supervised by her physician, including individualized plan of care and coordination with other care providers 2. 24/7 contact phone numbers for assistance for urgent and routine care needs. 3. Service will only be billed when office clinical staff spend 20 minutes or more in a month to coordinate care. 4. Only one practitioner may furnish and bill the service in a calendar month. 5. The patient may stop CCM services at any time (effective at the end of the month) by phone call to the office staff.   Patient agreed to services and verbal consent obtained.   Follow up plan:   Lauretta Grill Upstream Scheduler

## 2020-12-22 NOTE — Progress Notes (Signed)
Chronic Care Management Pharmacy Note  12/22/2020 Name:  [name] MRN:  [id number] DOB:  05/16/1933  Subjective: [name] is an 88 y.o. year old female who is a primary patient of Metheney, Catherine D, MD.  The CCM team was consulted for assistance with disease management and care coordination needs.    Engaged with patient face to face for initial visit in response to provider referral for pharmacy case management and/or care coordination services.   Consent to Services:  The patient was given information about Chronic Care Management services, agreed to services, and gave verbal consent prior to initiation of services.  Please see initial visit note for detailed documentation.   Patient Care Team: Metheney, Catherine D, MD as PCP - General (Family Medicine) Harper, Amy V, OD (Optometry) Gruber, Hope, MD as Attending Physician (Dermatology) Bates, Dwight, MD as Attending Physician (Otolaryngology) Grosman, Larry W, DC (Chiropractic Medicine) Kline, Keesha J, RPH as Pharmacist (Pharmacist)  Recent office visits: 09/21/20 - Dr. Metheney (PCP): HTN, COPD, aortic atherosclerosis f/u  Recent consult visits: N/A  Hospital visits: None in previous 6 months  Objective:  Lab Results  Component Value Date   CREATININE 0.92 (H) 09/21/2020   CREATININE 0.78 07/07/2020   CREATININE 0.84 01/06/2020    No results found for: HGBA1C Last diabetic Eye exam: No results found for: HMDIABEYEEXA  Last diabetic Foot exam: No results found for: HMDIABFOOTEX      Component Value Date/Time   CHOL 156 09/21/2020 0000   TRIG 69 09/21/2020 0000   HDL 66 09/21/2020 0000   CHOLHDL 2.4 09/21/2020 0000   VLDL 20 07/13/2016 1124   LDLCALC 75 09/21/2020 0000    Hepatic Function Latest Ref Rng & Units 09/21/2020 07/07/2020 07/08/2019  Total Protein 6.1 - 8.1 g/dL 6.5 6.4 6.9  Albumin 3.6 - 5.1 g/dL - - -  AST 10 - 35 U/L 19 20 22  ALT 6 - 29 U/L 18 19 19  Alk Phosphatase 33 - 130 U/L - -  -  Total Bilirubin 0.2 - 1.2 mg/dL 0.9 0.6 0.7    Lab Results  Component Value Date/Time   TSH 1.520 07/01/2014 10:07 AM   TSH 1.341 06/24/2013 09:31 AM    CBC Latest Ref Rng & Units 06/15/2012  WBC 4.0 - 10.5 K/uL 5.8  Hemoglobin 12.0 - 15.0 g/dL 14.4  Hematocrit 36.0 - 46.0 % 43.1  Platelets 150 - 400 K/uL 258    Lab Results  Component Value Date/Time   VD25OH 46 07/02/2018 10:57 AM   VD25OH 51 06/15/2012 11:07 AM     Social History   Tobacco Use  Smoking Status Never Smoker  Smokeless Tobacco Never Used   BP Readings from Last 3 Encounters:  09/21/20 118/65  07/22/20 (!) 168/72  07/07/20 130/62   Pulse Readings from Last 3 Encounters:  09/21/20 62  07/22/20 60  07/07/20 66   Wt Readings from Last 3 Encounters:  09/21/20 128 lb (58.1 kg)  07/22/20 127 lb (57.6 kg)  07/07/20 125 lb (56.7 kg)    Assessment: Review of patient past medical history, allergies, medications, health status, including review of consultants reports, laboratory and other test data, was performed as part of comprehensive evaluation and provision of chronic care management services.   SDOH:  (Social Determinants of Health) assessments and interventions performed:    CCM Care Plan  Allergies  Allergen Reactions  . Erythromycin Swelling  . Penicillins Swelling, Rash and Hives  . Statins Other (See   Comments)    Muscle aches    Medications Reviewed Today    Reviewed by [name], MD (Physician) on 09/21/20 at 1308  Med List Status: <None>  Medication Order Taking? Sig Documenting Provider Last Dose Status Informant  albuterol (PROAIR HFA) 108 (90 Base) MCG/ACT inhaler [id number] Yes inhale 2 puffs by mouth every 6 hours if needed [name], MD Taking Active   amLODipine (NORVASC) 5 MG tablet [id number] Yes Take 1 tablet (5 mg total) by mouth daily. [name], MD Taking Active   atorvastatin (LIPITOR) 10 MG tablet [id number] Yes Take 1 tablet (10 mg  total) by mouth at bedtime. [name], MD Taking Active   B Complex Vitamins (VITAMIN B COMPLEX PO) [id number] Yes Take 1 tablet by mouth every other day. [provider] Taking Active   Calcium Citrate-Vitamin D (CALCIUM CITRATE + D PO) [id number] Yes Take 1 tablet by mouth 2 (two) times daily. [provider] Taking Active   Cholecalciferol (VITAMIN D3) 1000 units CAPS [id number] Yes Take 1 capsule by mouth daily. [provider] Taking Active   losartan (COZAAR) 100 MG tablet [id number] Yes Take 1 tablet (100 mg total) by mouth daily. [name], MD Taking Active   Magnesium 200 MG TABS [id number] Yes Take 4 tablets by mouth daily. [provider] Taking Active Self  Methylsulfonylmethane (MSM) POWD [id number] Yes Take 2 Scoops by mouth 2 (two) times daily as needed. 2 teaspoons BID PRN [provider] Taking Active   Multiple Vitamins-Minerals (PRESERVISION AREDS 2) CAPS [id number] Yes Take 1 tablet by mouth 2 (two) times daily. [provider] Taking Active   NON FORMULARY [id number] Yes Take 3 tablets by mouth daily. curamin for pain [provider] Taking Active Self  umeclidinium-vilanterol (ANORO ELLIPTA) 62.5-25 MCG/INH AEPB [id number] Yes INHALE 1 PUFF BY MOUTH ONCE DAILY [name], MD Taking Active   vitamin C (ASCORBIC ACID) 500 MG tablet [id number] Yes Take 500 mg by mouth daily. [provider] Taking Active           Patient Active Problem List   Diagnosis Date Noted  . Nonexudative age-related macular degeneration of right eye 07/07/2020  . Baker's cyst of knee, left 09/17/2018  . Foreign body of leg, right, sequela 09/17/2018  . Arthritis of carpometacarpal [facility]) joint of right thumb 07/17/2017  . Aortic atherosclerosis ([place]) 03/20/2017  . Insomnia 07/13/2016  . Essential hypertension 03/10/2014  . 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 Vitamin D deficiency 06/15/2012  .  Internal hemorrhoid 06/15/2012  . COPD (chronic obstructive pulmonary disease) ([place]) 06/15/2012  . Palpitations 03/29/2012  . Asthma, exercise induced   . Macular degeneration     Immunization History  Administered Date(s) Administered  . Fluad Quad(high Dose 65+) 06/06/2020  . Influenza Split 05/07/2012  . Influenza, High Dose Seasonal PF 05/28/2013, 05/02/2017, 05/07/2018, 05/18/2019  . Influenza,inj,Quad PF,6+ Mos 04/23/2014, 05/14/2015  . Influenza-Unspecified 05/09/2016  . PFIZER(Purple Top)SARS-COV-2 Vaccination 08/21/2019, 09/11/2019, 06/02/2020  . Pneumococcal Conjugate-13 07/01/2014  . Pneumococcal Polysaccharide-23 08/01/2005  . Td 08/01/2001  . Tdap 08/01/2005, 10/07/2015    Conditions to be addressed/monitored: HTN, HLD and COPD  There are no care plans that you recently modified to display for this patient.   Medication Assistance: None required.  Patient affirms current coverage meets needs.  Patient's preferred pharmacy is:  [id number] [id number]: [phone]:  [phone]  [id number] [place], [place] - [address] [place] [address] Phone: [phone] Fax: [phone]  Uses pill box? Yes Pt endorses 100% compliance  Follow Up:  Patient agrees to Care Plan and Follow-up.  Plan: Telephone follow up appointment with care management team member scheduled for:  3 months  Keesha J Kline     

## 2020-12-23 ENCOUNTER — Ambulatory Visit (INDEPENDENT_AMBULATORY_CARE_PROVIDER_SITE_OTHER): Payer: Medicare HMO | Admitting: Pharmacist

## 2020-12-23 ENCOUNTER — Other Ambulatory Visit: Payer: Self-pay

## 2020-12-23 DIAGNOSIS — I700 Atherosclerosis of aorta: Secondary | ICD-10-CM

## 2020-12-23 DIAGNOSIS — I10 Essential (primary) hypertension: Secondary | ICD-10-CM

## 2020-12-23 DIAGNOSIS — J449 Chronic obstructive pulmonary disease, unspecified: Secondary | ICD-10-CM

## 2020-12-23 NOTE — Patient Instructions (Signed)
Visit Information   PATIENT GOALS:  Goals Addressed   None     Consent to CCM Services: Ms. Biskup was given information about Chronic Care Management services today including:  1. CCM service includes personalized support from designated clinical staff supervised by her physician, including individualized plan of care and coordination with other care providers 2. 24/7 contact phone numbers for assistance for urgent and routine care needs. 3. Service will only be billed when office clinical staff spend 20 minutes or more in a month to coordinate care. 4. Only one practitioner may furnish and bill the service in a calendar month. 5. The patient may stop CCM services at any time (effective at the end of the month) by phone call to the office staff. 6. The patient will be responsible for cost sharing (co-pay) of up to 20% of the service fee (after annual deductible is met).  Patient agreed to services and verbal consent obtained.   The patient verbalized understanding of instructions, educational materials, and care plan provided today and agreed to receive a mailed copy of patient instructions, educational materials, and care plan.   Telephone follow up appointment with care management team member scheduled for: 3 months  [place] CARE PLAN: Patient Care Plan: Medication Management    Problem Identified: HTN, COPD     Long-Range Goal: Disease Progression Prevention   Priority: High  Note:   Current Barriers:  . None at this time  Pharmacist Clinical Goal(s):  Marland Kitchen Over the next 90 days, patient will adhere to prescribed medication regimen as evidenced by fill history, routines, and pillbox through collaboration with PharmD and provider.   Interventions: . 1:1 collaboration with [name], MD regarding development and update of comprehensive plan of care as evidenced by provider attestation and co-signature . Inter-disciplinary care team  collaboration (see longitudinal plan of care) . Comprehensive medication review performed; medication list updated in electronic medical record  Hypertension: . Controlled; current treatment: amlodipine 62m, losartan 1041m;  . Current home readings: SBP 120s, but at night with anxiety/trouble sleeping notes SBP 190s at times. Reinforced calming strategies to manage anxiety & achieve BP control/sleep during the night . Denies hypotensive/hypertensive symptoms . Recommended continue current regimen   Hyperlipidemia: . Controlled; current treatment: atorvastatin 107m  . Recommended continue current regimen;   Chronic Obstructive Pulmonary Disease: . UMarland Kitchencontrolled/controlled; current treatment: Anoro Ellipta, albuterol PRN;  . 0 exacerbations requiring treatment in the last 6 months  . Recommended continue current regimen  Of note, patient described difficulty sleeping. Explained non-pharmacologic options (limiting fluids too close to bedtime, sleep hygiene). Also recommended OTC melatonin 3mg49mfore bedtime to aid circadian rhythm.   Patient Goals/Self-Care Activities . Over the next 90 days, patient will:  take medications as prescribed  Follow Up Plan: Telephone follow up appointment with care management team member scheduled for:  3 months

## 2020-12-30 DIAGNOSIS — H43813 Vitreous degeneration, bilateral: Secondary | ICD-10-CM | POA: Diagnosis not present

## 2020-12-30 DIAGNOSIS — H353231 Exudative age-related macular degeneration, bilateral, with active choroidal neovascularization: Secondary | ICD-10-CM | POA: Diagnosis not present

## 2020-12-30 DIAGNOSIS — H35371 Puckering of macula, right eye: Secondary | ICD-10-CM | POA: Diagnosis not present

## 2020-12-30 DIAGNOSIS — D3132 Benign neoplasm of left choroid: Secondary | ICD-10-CM | POA: Diagnosis not present

## 2021-01-03 ENCOUNTER — Other Ambulatory Visit: Payer: Self-pay | Admitting: Family Medicine

## 2021-01-03 DIAGNOSIS — I700 Atherosclerosis of aorta: Secondary | ICD-10-CM

## 2021-01-05 ENCOUNTER — Other Ambulatory Visit: Payer: Self-pay

## 2021-01-05 ENCOUNTER — Encounter: Payer: Self-pay | Admitting: Family Medicine

## 2021-01-05 ENCOUNTER — Ambulatory Visit (INDEPENDENT_AMBULATORY_CARE_PROVIDER_SITE_OTHER): Payer: Medicare HMO | Admitting: Family Medicine

## 2021-01-05 VITALS — BP 133/65 | HR 59 | Ht 64.0 in | Wt 128.0 lb

## 2021-01-05 DIAGNOSIS — I700 Atherosclerosis of aorta: Secondary | ICD-10-CM

## 2021-01-05 DIAGNOSIS — J449 Chronic obstructive pulmonary disease, unspecified: Secondary | ICD-10-CM

## 2021-01-05 DIAGNOSIS — M545 Low back pain, unspecified: Secondary | ICD-10-CM

## 2021-01-05 DIAGNOSIS — G8929 Other chronic pain: Secondary | ICD-10-CM | POA: Diagnosis not present

## 2021-01-05 DIAGNOSIS — I10 Essential (primary) hypertension: Secondary | ICD-10-CM | POA: Diagnosis not present

## 2021-01-05 NOTE — Assessment & Plan Note (Signed)
Does ok with home stretches, etc.

## 2021-01-05 NOTE — Progress Notes (Signed)
Established Patient Office Visit  Subjective:  Patient ID: [name], female    DOB: [birth year]  Age: 87 y.o. MRN: [id number]  CC:  Chief Complaint  Patient presents with  . Hypertension    HPI [name] presents for 6 mo f/u   Hypertension- Pt denies chest pain, SOB, dizziness, or heart palpitations.  Taking meds as directed w/o problems.  Denies medication side effects.    Follow-up COPD-she is doing well overall using her Anoro daily.  Only has a grabber a butyryl about maybe once a month.  F/U atheroslcerosis -rating her statin well so far without any problems initially she thought it was causing some shortness of breath but I think she had something else going on.  Complaint some of occasional back pain she says she notices more when she is standing for prolonged periods when she is more active it seems better.  She has some exercises that she does at home on her own and that seems to make a big difference.    Past Medical History:  Diagnosis Date  . Asthma, exercise induced   . Difficulty hearing   . History of parathyroid disease   . HTN (hypertension)   . Macular degeneration   . Migraine headache without aura   . OP (osteoporosis)   . Problems related to lack of adequate sleep     Past Surgical History:  Procedure Laterality Date  . ABDOMINAL HYSTERECTOMY     she thinks ovaries removed as well.    Marland Kitchen Anterior acromionectomy  07/31/00   left  . BREAST LUMPECTOMY  1990  . CATARACT EXTRACTION  [id number]  . PARATHYROIDECTOMY  09/06/04   Minimally invasive parathyroid surgery by Dr. Charise Killian in [place], [place].  . repair of torn rotator cuff  07/31/00   left, and 2007  . TONSILLECTOMY      Family History  Problem Relation Age of Onset  . Heart disease Father   . Cancer Mother        tumors on parathyroid, uterine  . Kidney disease Mother     Social History   Socioeconomic History  . Marital status: Married    Spouse name: [name]  . Number of  children: 2  . Years of education: 16  . Highest education level: Bachelor's degree (e.g., BA, AB, BS)  Occupational History  . Occupation: Games developer: RETIRED    Comment: retired  Tobacco Use  . Smoking status: Never Smoker  . Smokeless tobacco: Never Used  Vaping Use  . Vaping Use: Never used  Substance and Sexual Activity  . Alcohol use: No  . Drug use: No  . Sexual activity: Yes    Partners: Male  Other Topics Concern  . Not on file  Social History Narrative   At 4 years of college. No regular exercise. No significant caffeine intake. She does not drive any more due to the eye treatments.   Social Determinants of Health   Financial Resource Strain: Low Risk   . Difficulty of Paying Living Expenses: Not hard at all  Food Insecurity: No Food Insecurity  . Worried About Charity fundraiser in the Last Year: Never true  . Ran Out of Food in the Last Year: Never true  Transportation Needs: No Transportation Needs  . Lack of Transportation (Medical): No  . Lack of Transportation (Non-Medical): No  Physical Activity: Inactive  . Days of Exercise per Week: 0 days  . Minutes  of Exercise per Session: 0 min  Stress: No Stress Concern Present  . Feeling of Stress : Only a little  Social Connections: Socially Integrated  . Frequency of Communication with Friends and Family: More than three times a week  . Frequency of Social Gatherings with Friends and Family: Twice a week  . Attends Religious Services: More than 4 times per year  . Active Member of Clubs or Organizations: Yes  . Attends Archivist Meetings: More than 4 times per year  . Marital Status: Married  Human resources officer Violence: Not At Risk  . Fear of Current or Ex-Partner: No  . Emotionally Abused: No  . Physically Abused: No  . Sexually Abused: No    Outpatient Medications Prior to Visit  Medication Sig Dispense Refill  . albuterol (PROAIR HFA) 108 (90 Base) MCG/ACT inhaler inhale 2  puffs by mouth every 6 hours if needed 25.5 g prn  . amLODipine (NORVASC) 5 MG tablet Take 1 tablet (5 mg total) by mouth daily. 90 tablet 1  . atorvastatin (LIPITOR) 10 MG tablet TAKE 1 TABLET BY MOUTH EVERYDAY AT BEDTIME 90 tablet 1  . B Complex Vitamins (VITAMIN B COMPLEX PO) Take 1 tablet by mouth every other day.    . Calcium Citrate-Vitamin D (CALCIUM CITRATE + D PO) Take 1 tablet by mouth 2 (two) times daily.    . Cholecalciferol (VITAMIN D3) 1000 units CAPS Take 1 capsule by mouth daily.    Marland Kitchen losartan (COZAAR) 100 MG tablet Take 1 tablet (100 mg total) by mouth daily. 90 tablet 1  . Magnesium 200 MG TABS Take 4 tablets by mouth daily.    Marland Kitchen Methylsulfonylmethane (MSM) POWD Take 2 Scoops by mouth 2 (two) times daily as needed. 2 teaspoons BID PRN    . Multiple Vitamins-Minerals (PRESERVISION AREDS 2) CAPS Take 1 tablet by mouth 2 (two) times daily.    . NON FORMULARY Take 3 tablets by mouth daily. curamin for pain    . umeclidinium-vilanterol (ANORO ELLIPTA) 62.5-25 MCG/INH AEPB INHALE 1 PUFF BY MOUTH ONCE DAILY 60 each 5  . vitamin C (ASCORBIC ACID) 500 MG tablet Take 500 mg by mouth daily.     No facility-administered medications prior to visit.    Allergies  Allergen Reactions  . Erythromycin Swelling  . Penicillins Swelling, Rash and Hives  . Statins Other (See Comments)    Muscle aches    ROS Review of Systems    Objective:    Physical Exam Constitutional:      Appearance: She is well-developed.  HENT:     Head: Normocephalic and atraumatic.  Neck:     Comments: No carotid bruits.  Cardiovascular:     Rate and Rhythm: Normal rate and regular rhythm.     Heart sounds: Normal heart sounds.  Pulmonary:     Effort: Pulmonary effort is normal.     Breath sounds: Normal breath sounds.  Skin:    General: Skin is warm and dry.  Neurological:     Mental Status: She is alert and oriented to person, place, and time.  Psychiatric:        Behavior: Behavior normal.      BP 133/65   Pulse (!) 59   Ht 5\' 4"  (1.626 m)   Wt 128 lb (58.1 kg)   SpO2 99%   BMI 21.97 kg/m  Wt Readings from Last 3 Encounters:  01/05/21 128 lb (58.1 kg)  09/21/20 128 lb (58.1 kg)  07/22/20 127  lb (57.6 kg)     Health Maintenance Due  Topic Date Due  . Pneumococcal Vaccine 44-36 Years old (1 of 4 - PCV13) Never done  . Zoster Vaccines- Shingrix (1 of 2) Never done  . COVID-19 Vaccine (4 - Booster for Pfizer series) 09/02/2020    There are no preventive care reminders to display for this patient.  Lab Results  Component Value Date   TSH 1.520 07/01/2014   Lab Results  Component Value Date   WBC 5.8 06/15/2012   HGB 14.4 06/15/2012   HCT 43.1 06/15/2012   MCV 88.5 06/15/2012   PLT 258 06/15/2012   Lab Results  Component Value Date   NA 144 09/21/2020   K 4.3 09/21/2020   CO2 29 09/21/2020   GLUCOSE 90 09/21/2020   BUN 18 09/21/2020   CREATININE 0.92 (H) 09/21/2020   BILITOT 0.9 09/21/2020   ALKPHOS 79 07/13/2016   AST 19 09/21/2020   ALT 18 09/21/2020   PROT 6.5 09/21/2020   ALBUMIN 4.2 07/13/2016   CALCIUM 10.1 09/21/2020   Lab Results  Component Value Date   CHOL 156 09/21/2020   Lab Results  Component Value Date   HDL 66 09/21/2020   Lab Results  Component Value Date   LDLCALC 75 09/21/2020   Lab Results  Component Value Date   TRIG 69 09/21/2020   Lab Results  Component Value Date   CHOLHDL 2.4 09/21/2020   No results found for: HGBA1C    Assessment & Plan:   Problem List Items Addressed This Visit      Cardiovascular and Mediastinum   Essential hypertension - Primary    Well controlled. Continue current regimen. Follow up in  6 mo       Relevant Orders   Lipid Panel w/reflex Direct LDL   COMPLETE METABOLIC PANEL WITH GFR   Aortic atherosclerosis (HCC)    Tolerating statin well. Due to recheck lipids and liver enzymes.      Relevant Orders   Lipid Panel w/reflex Direct LDL   COMPLETE METABOLIC PANEL WITH GFR      Respiratory   COPD (chronic obstructive pulmonary disease) (HCC)    Stable.  No recent changes. Continue current regimen.         Other   Chronic low back pain    Does ok with home stretches, etc.           No orders of the defined types were placed in this encounter.   Follow-up: Return in about 6 months (around 07/07/2021) for Hypertension.    Beatrice Lecher, MD

## 2021-01-05 NOTE — Assessment & Plan Note (Signed)
Well controlled. Continue current regimen. Follow up in  6 mo  

## 2021-01-05 NOTE — Assessment & Plan Note (Signed)
Stable.  No recent changes. Continue current regimen.

## 2021-01-05 NOTE — Assessment & Plan Note (Signed)
Tolerating statin well. Due to recheck lipids and liver enzymes.

## 2021-01-06 LAB — LIPID PANEL W/REFLEX DIRECT LDL
Cholesterol: 161 mg/dL (ref <200)
HDL: 68 mg/dL (ref >=50)
LDL Cholesterol (Calc): 78 mg/dL (calc)
Non-HDL Cholesterol (Calc): 93 mg/dL (calc) (ref <130)
Total CHOL/HDL Ratio: 2.4 (calc) (ref <5.0)
Triglycerides: 72 mg/dL (ref <150)

## 2021-01-06 LAB — COMPLETE METABOLIC PANEL WITH GFR
AG Ratio: 2 (calc) (ref 1.0–2.5)
ALT: 20 U/L (ref 6–29)
AST: 19 U/L (ref 10–35)
Albumin: 4.4 g/dL (ref 3.6–5.1)
Alkaline phosphatase (APISO): 90 U/L (ref 37–153)
BUN: 17 mg/dL (ref 7–25)
CO2: 27 mmol/L (ref 20–32)
Calcium: 10.1 mg/dL (ref 8.6–10.4)
Chloride: 108 mmol/L (ref 98–110)
Creat: 0.85 mg/dL (ref 0.60–0.88)
GFR, Est African American: 72 mL/min/{1.73_m2} (ref >=60)
GFR, Est Non African American: 62 mL/min/{1.73_m2} (ref >=60)
Globulin: 2.2 g/dL (calc) (ref 1.9–3.7)
Glucose, Bld: 88 mg/dL (ref 65–99)
Potassium: 4.4 mmol/L (ref 3.5–5.3)
Sodium: 143 mmol/L (ref 135–146)
Total Bilirubin: 0.7 mg/dL (ref 0.2–1.2)
Total Protein: 6.6 g/dL (ref 6.1–8.1)

## 2021-01-07 NOTE — Progress Notes (Signed)
All labs are normal. 

## 2021-01-23 ENCOUNTER — Other Ambulatory Visit: Payer: Self-pay | Admitting: Family Medicine

## 2021-01-23 DIAGNOSIS — I10 Essential (primary) hypertension: Secondary | ICD-10-CM

## 2021-01-24 ENCOUNTER — Other Ambulatory Visit: Payer: Self-pay | Admitting: Family Medicine

## 2021-01-24 DIAGNOSIS — I10 Essential (primary) hypertension: Secondary | ICD-10-CM

## 2021-01-26 DIAGNOSIS — Z85828 Personal history of other malignant neoplasm of skin: Secondary | ICD-10-CM | POA: Diagnosis not present

## 2021-01-26 DIAGNOSIS — D2272 Melanocytic nevi of left lower limb, including hip: Secondary | ICD-10-CM | POA: Diagnosis not present

## 2021-01-26 DIAGNOSIS — D225 Melanocytic nevi of trunk: Secondary | ICD-10-CM | POA: Diagnosis not present

## 2021-01-26 DIAGNOSIS — L578 Other skin changes due to chronic exposure to nonionizing radiation: Secondary | ICD-10-CM | POA: Diagnosis not present

## 2021-01-26 DIAGNOSIS — L821 Other seborrheic keratosis: Secondary | ICD-10-CM | POA: Diagnosis not present

## 2021-01-26 DIAGNOSIS — R238 Other skin changes: Secondary | ICD-10-CM | POA: Diagnosis not present

## 2021-01-26 DIAGNOSIS — L720 Epidermal cyst: Secondary | ICD-10-CM | POA: Diagnosis not present

## 2021-01-26 DIAGNOSIS — D1721 Benign lipomatous neoplasm of skin and subcutaneous tissue of right arm: Secondary | ICD-10-CM | POA: Diagnosis not present

## 2021-01-26 DIAGNOSIS — Z86018 Personal history of other benign neoplasm: Secondary | ICD-10-CM | POA: Diagnosis not present

## 2021-03-10 DIAGNOSIS — H43813 Vitreous degeneration, bilateral: Secondary | ICD-10-CM | POA: Diagnosis not present

## 2021-03-10 DIAGNOSIS — D3132 Benign neoplasm of left choroid: Secondary | ICD-10-CM | POA: Diagnosis not present

## 2021-03-10 DIAGNOSIS — H35371 Puckering of macula, right eye: Secondary | ICD-10-CM | POA: Diagnosis not present

## 2021-03-10 DIAGNOSIS — H353231 Exudative age-related macular degeneration, bilateral, with active choroidal neovascularization: Secondary | ICD-10-CM | POA: Diagnosis not present

## 2021-03-25 ENCOUNTER — Other Ambulatory Visit: Payer: Self-pay

## 2021-03-25 ENCOUNTER — Ambulatory Visit (INDEPENDENT_AMBULATORY_CARE_PROVIDER_SITE_OTHER): Payer: Medicare HMO | Admitting: Pharmacist

## 2021-03-25 DIAGNOSIS — I700 Atherosclerosis of aorta: Secondary | ICD-10-CM

## 2021-03-25 DIAGNOSIS — J449 Chronic obstructive pulmonary disease, unspecified: Secondary | ICD-10-CM

## 2021-03-25 DIAGNOSIS — I10 Essential (primary) hypertension: Secondary | ICD-10-CM

## 2021-03-25 DIAGNOSIS — F5101 Primary insomnia: Secondary | ICD-10-CM

## 2021-03-25 NOTE — Progress Notes (Signed)
Chronic Care Management Pharmacy Note  03/26/2021 Name:  [name] MRN:  [id number] DOB:  Aug 19, 1932  Summary: addressed HTN, HLD, COPD, insomnia. Previously recommended OTC melatonin for insomnia at our last visit, pt states this is helping.  Recommendations/Changes made from today's visit: none  Plan: f/u with pharmacist in 6 months  Subjective: [name] is an 87 y.o. year old female who is a primary patient of Metheney, Rene Kocher, MD.  The CCM team was consulted for assistance with disease management and care coordination needs.    Engaged with patient by telephone for follow up visit in response to provider referral for pharmacy case management and/or care coordination services.   Consent to Services:  The patient was given information about Chronic Care Management services, agreed to services, and gave verbal consent prior to initiation of services.  Please see initial visit note for detailed documentation.   Patient Care Team: [name], MD as PCP - General (Family Medicine) Renelda Loma, [place] (Optometry) Crista Luria, MD as Attending Physician (Dermatology) Melida Quitter, MD as Attending Physician (Otolaryngology) Toribio Harbour, DC (Chiropractic Medicine) Darius Bump, [facility] as Pharmacist (Pharmacist)  Objective:  Lab Results  Component Value Date   CREATININE 0.85 01/05/2021   CREATININE 0.92 (H) 09/21/2020   CREATININE 0.78 07/07/2020       Component Value Date/Time   CHOL 161 01/05/2021 0000   TRIG 72 01/05/2021 0000   HDL 68 01/05/2021 0000   CHOLHDL 2.4 01/05/2021 0000   VLDL 20 07/13/2016 1124   LDLCALC 78 01/05/2021 0000    Hepatic Function Latest Ref Rng & Units 01/05/2021 09/21/2020 07/07/2020  Total Protein 6.1 - 8.1 g/dL 6.6 6.5 6.4  Albumin 3.6 - 5.1 g/dL - - -  AST 10 - 35 U/L 19 19 20   ALT 6 - 29 U/L 20 18 19   Alk Phosphatase 33 - 130 U/L - - -  Total Bilirubin 0.2 - 1.2 mg/dL 0.7 0.9 0.6    Lab Results  Component  Value Date/Time   TSH 1.520 07/01/2014 10:07 AM   TSH 1.341 06/24/2013 09:31 AM    CBC Latest Ref Rng & Units 06/15/2012  WBC 4.0 - 10.5 K/uL 5.8  Hemoglobin 12.0 - 15.0 g/dL 14.4  Hematocrit 36.0 - 46.0 % 43.1  Platelets 150 - 400 K/uL 258    Lab Results  Component Value Date/Time   VD25OH 46 07/02/2018 10:57 AM   VD25OH 51 06/15/2012 11:07 AM    Social History   Tobacco Use  Smoking Status Never  Smokeless Tobacco Never   BP Readings from Last 3 Encounters:  01/05/21 133/65  09/21/20 118/65  07/22/20 (!) 168/72   Pulse Readings from Last 3 Encounters:  01/05/21 (!) 59  09/21/20 62  07/22/20 60   Wt Readings from Last 3 Encounters:  01/05/21 128 lb (58.1 kg)  09/21/20 128 lb (58.1 kg)  07/22/20 127 lb (57.6 kg)    Assessment: Review of patient past medical history, allergies, medications, health status, including review of consultants reports, laboratory and other test data, was performed as part of comprehensive evaluation and provision of chronic care management services.   SDOH:  (Social Determinants of Health) assessments and interventions performed:    CCM Care Plan  Allergies  Allergen Reactions   Erythromycin Swelling   Penicillins Swelling, Rash and Hives   Statins Other (See Comments)    Muscle aches    Medications Reviewed Today     Reviewed  by Darius Bump, [place] (Pharmacist) on 03/25/21 at 1312  Med List Status: <None>   Medication Order Taking? Sig Documenting Provider Last Dose Status Informant  albuterol (PROAIR HFA) 108 (90 Base) MCG/ACT inhaler [id number] Yes inhale 2 puffs by mouth every 6 hours if needed [name], MD Taking Active   amLODipine (NORVASC) 5 MG tablet [id number] Yes TAKE 1 TABLET BY MOUTH EVERY DAY [name], MD Taking Active   atorvastatin (LIPITOR) 10 MG tablet [id number] Yes TAKE 1 TABLET BY MOUTH EVERYDAY AT BEDTIME [name], MD Taking Active   B Complex Vitamins (VITAMIN B COMPLEX  PO) [id number] Yes Take 1 tablet by mouth every other day. [provider] Taking Active   Calcium Citrate-Vitamin D (CALCIUM CITRATE + D PO) [id number] Yes Take 1 tablet by mouth 2 (two) times daily. [provider] Taking Active   Cholecalciferol (VITAMIN D3) 1000 units CAPS [id number] Yes Take 1 capsule by mouth daily. [provider] Taking Active   losartan (COZAAR) 100 MG tablet [id number] Yes TAKE 1 TABLET BY MOUTH EVERY DAY [name], MD Taking Active   Magnesium 200 MG TABS [id number] Yes Take 4 tablets by mouth daily. [provider] Taking Active Self  melatonin 5 MG TABS [id number] Yes Take 5 mg by mouth. [provider] Taking Active   Methylsulfonylmethane (MSM) POWD [id number] Yes Take 2 Scoops by mouth 2 (two) times daily as needed. 2 teaspoons BID PRN [provider] Taking Active   Multiple Vitamins-Minerals (PRESERVISION AREDS 2) CAPS [id number] Yes Take 1 tablet by mouth 2 (two) times daily. [provider] Taking Active   NON FORMULARY [id number] Yes Take 3 tablets by mouth daily. curamin for pain [provider] Taking Active Self  umeclidinium-vilanterol (ANORO ELLIPTA) 62.5-25 MCG/INH AEPB [id number] Yes INHALE 1 PUFF BY MOUTH ONCE DAILY [name], MD Taking Active   vitamin C (ASCORBIC ACID) 500 MG tablet [id number] Yes Take 500 mg by mouth daily. [provider] Taking Active             Patient Active Problem List   Diagnosis Date Noted   Chronic low back pain 01/05/2021   Nonexudative age-related macular degeneration of right eye 07/07/2020   Baker's cyst of knee, left 09/17/2018   Foreign body of leg, right, sequela 09/17/2018   Arthritis of [facility]) joint of right thumb 07/17/2017   Aortic atherosclerosis ([place]) 03/20/2017   Insomnia 07/13/2016   Essential hypertension 03/10/2014   Squamous cell carcinoma of hand 06/16/2012   Vitamin D deficiency 06/15/2012    Internal hemorrhoid 06/15/2012   COPD (chronic obstructive pulmonary disease) ([place]) 06/15/2012   Palpitations 03/29/2012   Asthma, exercise induced    Macular degeneration     Immunization History  Administered Date(s) Administered   Fluad Quad(high Dose 65+) 06/06/2020   Influenza Split 05/07/2012   Influenza, High Dose Seasonal PF 05/28/2013, 05/02/2017, 05/07/2018, 05/18/2019   Influenza,inj,Quad PF,6+ Mos 04/23/2014, 05/14/2015   Influenza-Unspecified 05/09/2016   PFIZER(Purple Top)SARS-COV-2 Vaccination 08/21/2019, 09/11/2019, 06/02/2020   Pneumococcal Conjugate-13 07/01/2014   Pneumococcal Polysaccharide-23 08/01/2005   Td 08/01/2001   Tdap 08/01/2005, 10/07/2015    Conditions to be addressed/monitored: HTN, HLD, COPD, and insomnia  There are no care plans that you recently modified to display for this patient.   Medication Assistance: None required.  Patient affirms current coverage meets needs.  Patient's preferred pharmacy is:  [id number] [place] [place] [address]  [place] [address] Phone: [phone] Fax: [phone]  [id number] [address]: [phone]: [phone] Uses pill box? Yes Pt endorses 100% compliance  Follow Up:  Patient agrees to Care Plan and Follow-up.  Plan: Telephone follow up appointment with care management team member scheduled for:  6 months  KDarius Bump

## 2021-03-26 NOTE — Patient Instructions (Signed)
Visit Information  PATIENT GOALS:  Goals Addressed             This Visit's Progress    Medication Management       Patient Goals/Self-Care Activities Over the next 180 days, patient will:  take medications as prescribed   Follow Up Plan: Telephone follow up appointment with care management team member scheduled for:  6 months        Patient verbalizes understanding of instructions provided today and agrees to view in [place].   Telephone follow up appointment with care management team member scheduled for: 6 months   [name]

## 2021-05-19 DIAGNOSIS — D3132 Benign neoplasm of left choroid: Secondary | ICD-10-CM | POA: Diagnosis not present

## 2021-05-19 DIAGNOSIS — H43813 Vitreous degeneration, bilateral: Secondary | ICD-10-CM | POA: Diagnosis not present

## 2021-05-19 DIAGNOSIS — H35371 Puckering of macula, right eye: Secondary | ICD-10-CM | POA: Diagnosis not present

## 2021-05-19 DIAGNOSIS — H353231 Exudative age-related macular degeneration, bilateral, with active choroidal neovascularization: Secondary | ICD-10-CM | POA: Diagnosis not present

## 2021-06-23 ENCOUNTER — Other Ambulatory Visit: Payer: Self-pay | Admitting: Family Medicine

## 2021-06-23 DIAGNOSIS — J449 Chronic obstructive pulmonary disease, unspecified: Secondary | ICD-10-CM

## 2021-06-26 ENCOUNTER — Other Ambulatory Visit: Payer: Self-pay | Admitting: Family Medicine

## 2021-06-26 DIAGNOSIS — I700 Atherosclerosis of aorta: Secondary | ICD-10-CM

## 2021-07-08 ENCOUNTER — Ambulatory Visit (INDEPENDENT_AMBULATORY_CARE_PROVIDER_SITE_OTHER): Payer: Medicare HMO | Admitting: Family Medicine

## 2021-07-08 ENCOUNTER — Encounter: Payer: Self-pay | Admitting: Family Medicine

## 2021-07-08 ENCOUNTER — Other Ambulatory Visit: Payer: Self-pay

## 2021-07-08 VITALS — BP 128/67 | HR 69 | Temp 98.2°F | Resp 16 | Ht 63.0 in | Wt 127.0 lb

## 2021-07-08 DIAGNOSIS — J45990 Exercise induced bronchospasm: Secondary | ICD-10-CM | POA: Diagnosis not present

## 2021-07-08 DIAGNOSIS — J449 Chronic obstructive pulmonary disease, unspecified: Secondary | ICD-10-CM | POA: Diagnosis not present

## 2021-07-08 DIAGNOSIS — H353110 Nonexudative age-related macular degeneration, right eye, stage unspecified: Secondary | ICD-10-CM

## 2021-07-08 DIAGNOSIS — I10 Essential (primary) hypertension: Secondary | ICD-10-CM

## 2021-07-08 DIAGNOSIS — Z780 Asymptomatic menopausal state: Secondary | ICD-10-CM | POA: Diagnosis not present

## 2021-07-08 MED ORDER — LOSARTAN POTASSIUM 100 MG PO TABS: 100.0000 mg | ORAL_TABLET | Freq: Every day | ORAL | 1 refills | Status: DC

## 2021-07-08 NOTE — Assessment & Plan Note (Signed)
Able on current regimen she says she is really only had to use her albuterol about 4 times which is fantastic.

## 2021-07-08 NOTE — Progress Notes (Signed)
Established Patient Office Visit  Subjective:  Patient ID: [name], female    DOB: Apr 29, 1933  Age: 87 y.o. MRN: [id number]  CC:  Chief Complaint  Patient presents with   Hypertension    Follow up      HPI [name] presents for   Hypertension- Pt denies chest pain, SOB, dizziness, or heart palpitations.  Taking meds as directed w/o problems.  Denies medication side effects.  Brought in home blood pressure log some of the blood pressures look fantastic at 121/71 but she did have a couple elevated blood pressures in the 150s and low 160s.  But most of them do look good.  F/U COPD/Asthma - she is on Anoro Ellipta and albuterol PRN.    Hyperlipidemia - tolerating stating well with no myalgias or significant side effects. Last LDL under 100.   Lab Results  Component Value Date   CHOL 161 01/05/2021   HDL 68 01/05/2021   LDLCALC 78 01/05/2021   TRIG 72 01/05/2021   CHOLHDL 2.4 01/05/2021   Macular degeneration-she still been getting regular injections on her eyes she says the biggest frustration is when she cannot see to do something like follow a recipe.  She says it really gets her down when that happens and she will usually just try to do a different activity to try to get her mind off of it.  But otherwise she feels like she is able to do all of her ADLs and spend time with family she says she tries to stay active all day.    She says they were cleaning out their garage recently and she is had a little bit of more back soreness and right shoulder discomfort but she usually just uses heat, and over-the-counter rubs and a pulsating device and usually after few days it feels better.   Past Medical History:  Diagnosis Date   Asthma, exercise induced    Difficulty hearing    History of parathyroid disease    HTN (hypertension)    Macular degeneration    Migraine headache without aura    OP (osteoporosis)    Problems related to lack of adequate sleep     Past  Surgical History:  Procedure Laterality Date   ABDOMINAL HYSTERECTOMY     she thinks ovaries removed as well.     Anterior acromionectomy  07/31/00   left   BREAST LUMPECTOMY  1990   CATARACT EXTRACTION  [id number]   PARATHYROIDECTOMY  09/06/04   Minimally invasive parathyroid surgery by Dr. Charise Killian in [place], [place].   repair of torn rotator cuff  07/31/00   left, and 2007   TONSILLECTOMY      Family History  Problem Relation Age of Onset   Heart disease Father    Cancer Mother        tumors on parathyroid, uterine   Kidney disease Mother     Social History   Socioeconomic History   Marital status: Married    Spouse name: [name]   Number of children: 2   Years of education: 16   Highest education level: Bachelor's degree (e.g., BA, AB, BS)  Occupational History   Occupation: Games developer: RETIRED    Comment: retired  Tobacco Use   Smoking status: Never   Smokeless tobacco: Never  Vaping Use   Vaping Use: Never used  Substance and Sexual Activity   Alcohol use: No   Drug use: No   Sexual activity:  Yes    Partners: Male  Other Topics Concern   Not on file  Social History Narrative   At 4 years of college. No regular exercise. No significant caffeine intake. She does not drive any more due to the eye treatments.   Social Determinants of Health   Financial Resource Strain: Low Risk    Difficulty of Paying Living Expenses: Not hard at all  Food Insecurity: No Food Insecurity   Worried About Charity fundraiser in the Last Year: Never true   [place] in the Last Year: Never true  Transportation Needs: No Transportation Needs   Lack of Transportation (Medical): No   Lack of Transportation (Non-Medical): No  Physical Activity: Inactive   Days of Exercise per Week: 0 days   Minutes of Exercise per Session: 0 min  Stress: No Stress Concern Present   Feeling of Stress : Only a little  Social Connections: Engineer, building services of  Communication with Friends and Family: More than three times a week   Frequency of Social Gatherings with Friends and Family: Twice a week   Attends Religious Services: More than 4 times per year   Active Member of [employer] or Organizations: Yes   Attends Music therapist: More than 4 times per year   Marital Status: Married  Human resources officer Violence: Not At Risk   Fear of Current or Ex-Partner: No   Emotionally Abused: No   Physically Abused: No   Sexually Abused: No    Outpatient Medications Prior to Visit  Medication Sig Dispense Refill   albuterol (PROAIR HFA) 108 (90 Base) MCG/ACT inhaler inhale 2 puffs by mouth every 6 hours if needed 25.5 g prn   amLODipine (NORVASC) 5 MG tablet TAKE 1 TABLET BY MOUTH EVERY DAY 90 tablet 1   ANORO ELLIPTA 62.5-25 MCG/ACT AEPB INHALE 1 PUFF BY MOUTH EVERY DAY 60 each 5   atorvastatin (LIPITOR) 10 MG tablet TAKE 1 TABLET BY MOUTH EVERYDAY AT BEDTIME 90 tablet 1   B Complex Vitamins (VITAMIN B COMPLEX PO) Take 1 tablet by mouth every other day.     Calcium Citrate-Vitamin D (CALCIUM CITRATE + D PO) Take 1 tablet by mouth 2 (two) times daily.     Cholecalciferol (VITAMIN D3) 1000 units CAPS Take 1 capsule by mouth 3 (three) times daily.     Magnesium 200 MG TABS Take 4 tablets by mouth daily.     melatonin 5 MG TABS Take 5 mg by mouth.     Methylsulfonylmethane (MSM) POWD Take 2 Scoops by mouth 2 (two) times daily as needed. 2 teaspoons BID PRN     Multiple Vitamins-Minerals (PRESERVISION AREDS 2) CAPS Take 1 tablet by mouth 2 (two) times daily.     NON FORMULARY Take 3 tablets by mouth daily. curamin for pain     vitamin C (ASCORBIC ACID) 500 MG tablet Take 500 mg by mouth daily.     losartan (COZAAR) 100 MG tablet TAKE 1 TABLET BY MOUTH EVERY DAY 90 tablet 1   No facility-administered medications prior to visit.    Allergies  Allergen Reactions   Erythromycin Swelling   Penicillins Swelling, Rash and Hives   Statins Other (See  Comments)    Muscle aches    ROS Review of Systems    Objective:    Physical Exam Constitutional:      Appearance: Normal appearance. She is well-developed.  HENT:     Head: Normocephalic and atraumatic.  Cardiovascular:     Rate and Rhythm: Normal rate and regular rhythm.     Heart sounds: Normal heart sounds.  Pulmonary:     Effort: Pulmonary effort is normal.     Breath sounds: Normal breath sounds.  Skin:    General: Skin is warm and dry.  Neurological:     Mental Status: She is alert and oriented to person, place, and time.  Psychiatric:        Behavior: Behavior normal.    BP 128/67 Comment: home bp today  Pulse 69   Temp 98.2 F (36.8 C)   Resp 16   Ht 5\' 3"  (1.6 m)   Wt 127 lb (57.6 kg)   SpO2 99%   BMI 22.50 kg/m  Wt Readings from Last 3 Encounters:  07/08/21 127 lb (57.6 kg)  01/05/21 128 lb (58.1 kg)  09/21/20 128 lb (58.1 kg)     There are no preventive care reminders to display for this patient.   There are no preventive care reminders to display for this patient.  Lab Results  Component Value Date   TSH 1.520 07/01/2014   Lab Results  Component Value Date   WBC 5.8 06/15/2012   HGB 14.4 06/15/2012   HCT 43.1 06/15/2012   MCV 88.5 06/15/2012   PLT 258 06/15/2012   Lab Results  Component Value Date   NA 143 01/05/2021   K 4.4 01/05/2021   CO2 27 01/05/2021   GLUCOSE 88 01/05/2021   BUN 17 01/05/2021   CREATININE 0.85 01/05/2021   BILITOT 0.7 01/05/2021   ALKPHOS 79 07/13/2016   AST 19 01/05/2021   ALT 20 01/05/2021   PROT 6.6 01/05/2021   ALBUMIN 4.2 07/13/2016   CALCIUM 10.1 01/05/2021   Lab Results  Component Value Date   CHOL 161 01/05/2021   Lab Results  Component Value Date   HDL 68 01/05/2021   Lab Results  Component Value Date   LDLCALC 78 01/05/2021   Lab Results  Component Value Date   TRIG 72 01/05/2021   Lab Results  Component Value Date   CHOLHDL 2.4 01/05/2021   No results found for: HGBA1C     Assessment & Plan:   Problem List Items Addressed This Visit       Cardiovascular and Mediastinum   Essential hypertension - Primary    Pressures are little elevated today.  But some of her home blood pressures do look good she says usually will jump up if she is feeling little stressed.      Relevant Medications   losartan (COZAAR) 100 MG tablet   Other Relevant Orders   BASIC METABOLIC PANEL WITH GFR   CBC     Respiratory   COPD (chronic obstructive pulmonary disease) ([place])    Able on current regimen she says she is really only had to use her albuterol about 4 times which is fantastic.      Relevant Orders   BASIC METABOLIC PANEL WITH GFR   CBC   Asthma, exercise induced     Other   Nonexudative age-related macular degeneration of right eye    Still seeing her eye doctor and receiving therapeutic injections regularly.      Other Visit Diagnoses     Post-menopausal       Relevant Orders   DG Bone Density       Still declines shingles vaccine today.  Order DEXA scan.  Meds ordered this encounter  Medications   losartan (COZAAR)  100 MG tablet    Sig: Take 1 tablet (100 mg total) by mouth daily.    Dispense:  90 tablet    Refill:  1     Follow-up: Return in about 6 months (around 01/06/2022) for Hypertension and labs .    Beatrice Lecher, MD

## 2021-07-08 NOTE — Assessment & Plan Note (Signed)
Still seeing her eye doctor and receiving therapeutic injections regularly.

## 2021-07-08 NOTE — Assessment & Plan Note (Signed)
Pressures are little elevated today.  But some of her home blood pressures do look good she says usually will jump up if she is feeling little stressed.

## 2021-07-09 LAB — BASIC METABOLIC PANEL WITH GFR
BUN: 19 mg/dL (ref 7–25)
CO2: 27 mmol/L (ref 20–32)
Calcium: 10.1 mg/dL (ref 8.6–10.4)
Chloride: 105 mmol/L (ref 98–110)
Creat: 0.79 mg/dL (ref 0.60–0.95)
Glucose, Bld: 81 mg/dL (ref 65–99)
Potassium: 4.4 mmol/L (ref 3.5–5.3)
Sodium: 142 mmol/L (ref 135–146)
eGFR: 73 mL/min/{1.73_m2} (ref >=60)

## 2021-07-09 LAB — SPECIMEN COMPROMISED

## 2021-07-09 LAB — CBC
HCT: 46.4 % — ABNORMAL HIGH (ref 35.0–45.0)
Hemoglobin: 15.2 g/dL (ref 11.7–15.5)
MCH: 29.8 pg (ref 27.0–33.0)
MCHC: 32.8 g/dL (ref 32.0–36.0)
MCV: 91 fL (ref 80.0–100.0)
MPV: 11.1 fL (ref 7.5–12.5)
Platelets: 214 10*3/uL (ref 140–400)
RBC: 5.1 10*6/uL (ref 3.80–5.10)
RDW: 12.5 % (ref 11.0–15.0)
WBC: 7.6 10*3/uL (ref 3.8–10.8)

## 2021-07-09 NOTE — Progress Notes (Signed)
Your lab work is within acceptable range and there are no concerning findings.   ?

## 2021-07-21 ENCOUNTER — Other Ambulatory Visit: Payer: Self-pay

## 2021-07-21 ENCOUNTER — Encounter: Payer: Self-pay | Admitting: Family Medicine

## 2021-07-21 ENCOUNTER — Ambulatory Visit (INDEPENDENT_AMBULATORY_CARE_PROVIDER_SITE_OTHER): Payer: Medicare HMO

## 2021-07-21 ENCOUNTER — Other Ambulatory Visit: Payer: Self-pay | Admitting: Family Medicine

## 2021-07-21 DIAGNOSIS — M810 Age-related osteoporosis without current pathological fracture: Secondary | ICD-10-CM | POA: Diagnosis not present

## 2021-07-21 DIAGNOSIS — Z780 Asymptomatic menopausal state: Secondary | ICD-10-CM | POA: Diagnosis not present

## 2021-07-21 DIAGNOSIS — I10 Essential (primary) hypertension: Secondary | ICD-10-CM

## 2021-07-21 NOTE — Progress Notes (Signed)
Your bone density shows you have osteoporosis:   The current recommendation for osteoporosis treatment includes:   #1 calcium-total of 1200 mg of calcium daily.  If you eat a very calcium rich diet you may be able to obtain that without a supplement.  If not, then I recommend calcium 500 mg twice a day.  There are several products over-the-counter such as Caltrate D and Viactiv chews which are great options that contain calcium and vitamin D. #2 vitamin D-recommend 800 international units daily. #3 exercise-recommend 30 minutes of weightbearing exercise 3 days a week.  Resistance training ,such as doing bands and light weights, can be particularly helpful. #4 medication-if you are not currently on a bone builder, also called a bisphosphonate, then this has been shown to be very helpful in maintaining bone strength, preventing further thinning of the bones, and reducing your risk for fractures.  I would highly recommend that you consider starting 1 of these medications.  If you are okay with that then please let us know and we will send one to your pharmacy.  If you would like to discuss further we are happy to make an appointment for you so that we can go over options for treatment.

## 2021-07-27 ENCOUNTER — Other Ambulatory Visit: Payer: Self-pay | Admitting: Family Medicine

## 2021-07-27 DIAGNOSIS — J45990 Exercise induced bronchospasm: Secondary | ICD-10-CM

## 2021-07-27 DIAGNOSIS — J449 Chronic obstructive pulmonary disease, unspecified: Secondary | ICD-10-CM

## 2021-07-28 ENCOUNTER — Other Ambulatory Visit: Payer: Self-pay

## 2021-07-28 DIAGNOSIS — J449 Chronic obstructive pulmonary disease, unspecified: Secondary | ICD-10-CM

## 2021-07-28 DIAGNOSIS — H353231 Exudative age-related macular degeneration, bilateral, with active choroidal neovascularization: Secondary | ICD-10-CM | POA: Diagnosis not present

## 2021-07-28 DIAGNOSIS — H35371 Puckering of macula, right eye: Secondary | ICD-10-CM | POA: Diagnosis not present

## 2021-07-28 DIAGNOSIS — D3132 Benign neoplasm of left choroid: Secondary | ICD-10-CM | POA: Diagnosis not present

## 2021-07-28 DIAGNOSIS — H43813 Vitreous degeneration, bilateral: Secondary | ICD-10-CM | POA: Diagnosis not present

## 2021-07-28 DIAGNOSIS — J45990 Exercise induced bronchospasm: Secondary | ICD-10-CM

## 2021-07-28 MED ORDER — ALBUTEROL SULFATE HFA 108 (90 BASE) MCG/ACT IN AERS: INHALATION_SPRAY | RESPIRATORY_TRACT | 99 refills | Status: DC

## 2021-07-28 NOTE — Addendum Note (Signed)
Addended by: Izora Gala on: 07/28/2021 11:01 AM   Modules accepted: Orders

## 2021-07-30 DIAGNOSIS — H524 Presbyopia: Secondary | ICD-10-CM | POA: Diagnosis not present

## 2021-09-08 ENCOUNTER — Ambulatory Visit (INDEPENDENT_AMBULATORY_CARE_PROVIDER_SITE_OTHER): Payer: Medicare HMO | Admitting: Family Medicine

## 2021-09-08 ENCOUNTER — Other Ambulatory Visit: Payer: Self-pay

## 2021-09-08 DIAGNOSIS — Z0000 Encounter for general adult medical examination without abnormal findings: Secondary | ICD-10-CM | POA: Diagnosis not present

## 2021-09-08 NOTE — Progress Notes (Signed)
MEDICARE ANNUAL WELLNESS VISIT  09/08/2021  Telephone Visit Disclaimer This Medicare AWV was conducted by telephone due to national recommendations for restrictions regarding the COVID-19 Pandemic (e.g. social distancing).  I verified, using two identifiers, that I am speaking with [name] or their authorized healthcare agent. I discussed the limitations, risks, security, and privacy concerns of performing an evaluation and management service by telephone and the potential availability of an in-person appointment in the future. The patient expressed understanding and agreed to proceed.  Location of Patient: Home Location of Provider (nurse):  In the office.  Subjective:    [name] is a 88 y.o. female patient of Metheney, Rene Kocher, MD who had a Medicare Annual Wellness Visit today via telephone. [name] is Retired and lives with their spouse. she has 2 children. she reports that she is socially active and does interact with friends/family regularly. she is minimally physically active and enjoys sewing and playing the piano.  Patient Care Team: [name], MD as PCP - General (Family Medicine) Renelda Loma, [place] (Optometry) Crista Luria, MD as Attending Physician (Dermatology) Melida Quitter, MD as Attending Physician (Otolaryngology) Toribio Harbour, DC (Chiropractic Medicine) Darius Bump, [facility] as Pharmacist (Pharmacist)  Advanced Directives 09/08/2021 07/22/2020 07/22/2019 07/18/2018 07/17/2017 07/01/2014 01/08/2014  Does Patient Have a Medical Advance Directive? Yes Yes Yes Yes Yes Yes Patient has advance directive, copy not in chart  Type of Advance Directive Living will;Healthcare Power of [place];Living will Newborn;Living will [place];Living will [place];Living will Living will Living will  Does patient want to make changes to medical advance directive? No - Patient  declined No - Patient declined No - Patient declined No - Patient declined - - -  Copy of [place] in Chart? Yes - validated most recent copy scanned in chart (See row information) Yes - validated most recent copy scanned in chart (See row information) No - copy requested No - copy requested No - copy requested No - copy requested [facility] Utilization Over the Past 12 Months: # of hospitalizations or ER visits: 0 # of surgeries: 0  Review of Systems    Patient reports that her overall health is unchanged compared to last year.  History obtained from chart review and the patient  Patient Reported Readings (BP, Pulse, CBG, Weight, etc) none  Pain Assessment Pain : No/denies pain     Current Medications & Allergies (verified) Allergies as of 09/08/2021       Reactions   Erythromycin Swelling   Penicillins Swelling, Rash, Hives   Statins Other (See Comments)   Muscle aches        Medication List        Accurate as of September 08, 2021 10:27 AM. If you have any questions, ask your nurse or doctor.          albuterol 108 (90 Base) MCG/ACT inhaler Commonly known as: VENTOLIN HFA INHALE 2 PUFFS BY MOUTH EVERY 6 HOURS IF NEEDED.   amLODipine 5 MG tablet Commonly known as: NORVASC TAKE 1 TABLET BY MOUTH EVERY DAY   Anoro Ellipta 62.5-25 MCG/ACT Aepb Generic drug: umeclidinium-vilanterol INHALE 1 PUFF BY MOUTH EVERY DAY   atorvastatin 10 MG tablet Commonly known as: LIPITOR TAKE 1 TABLET BY MOUTH EVERYDAY AT BEDTIME   azelastine 0.05 % ophthalmic solution Commonly known as: OPTIVAR Apply 1 drop to eye 4 (four)  times daily. Uses it as needed.   CALCIUM CITRATE + D PO Take 1 tablet by mouth 2 (two) times daily.   losartan 100 MG tablet Commonly known as: COZAAR Take 1 tablet (100 mg total) by mouth daily.   Magnesium 200 MG Tabs Take 4 tablets by mouth daily.   melatonin 5 MG Tabs Take 5 mg by mouth.   MSM Powd Take 2 Scoops by  mouth 2 (two) times daily as needed. 2 teaspoons BID PRN   NON FORMULARY Take 3 tablets by mouth daily. curamin for pain   PreserVision AREDS 2 Caps Take 1 tablet by mouth 2 (two) times daily.   VITAMIN B COMPLEX PO Take 1 tablet by mouth every other day.   vitamin C 500 MG tablet Commonly known as: ASCORBIC ACID Take 500 mg by mouth daily.   Vitamin D3 25 MCG (1000 UT) Caps Take 1 capsule by mouth 3 (three) times daily.        History (reviewed): Past Medical History:  Diagnosis Date   Asthma, exercise induced    Difficulty hearing    History of parathyroid disease    HTN (hypertension)    Macular degeneration    Migraine headache without aura    OP (osteoporosis)    Problems related to lack of adequate sleep    Past Surgical History:  Procedure Laterality Date   ABDOMINAL HYSTERECTOMY     she thinks ovaries removed as well.     Anterior acromionectomy  07/31/00   left   BREAST LUMPECTOMY  1990   CATARACT EXTRACTION  [id number]   PARATHYROIDECTOMY  09/06/04   Minimally invasive parathyroid surgery by Dr. Charise Killian in [place], [place].   repair of torn rotator cuff  07/31/00   left, and 2007   TONSILLECTOMY     Family History  Problem Relation Age of Onset   Heart disease Father    Cancer Mother        tumors on parathyroid, uterine   Kidney disease Mother    Social History   Socioeconomic History   Marital status: Married    Spouse name: [name]   Number of children: 2   Years of education: 16   Highest education level: Bachelor's degree (e.g., BA, AB, BS)  Occupational History   Occupation: Games developer: RETIRED    Comment: retired  Tobacco Use   Smoking status: Never   Smokeless tobacco: Never  Vaping Use   Vaping Use: Never used  Substance and Sexual Activity   Alcohol use: No   Drug use: No   Sexual activity: Yes    Partners: Male  Other Topics Concern   Not on file  Social History Narrative   Lives with her husband. She is  getting shots in her eyes which causes her not to be able to do many things she enjoyed doing before. She enjoys sewing and playing the piano.   Social Determinants of Health   Financial Resource Strain: Low Risk    Difficulty of Paying Living Expenses: Not hard at all  Food Insecurity: No Food Insecurity   Worried About Charity fundraiser in the Last Year: Never true   [place] in the Last Year: Never true  Transportation Needs: No Transportation Needs   Lack of Transportation (Medical): No   Lack of Transportation (Non-Medical): No  Physical Activity: Inactive   Days of Exercise per Week: 0 days   Minutes of Exercise per Session:  0 min  Stress: Stress Concern Present   Feeling of Stress : To some extent  Social Connections: Moderately Integrated   Frequency of Communication with Friends and Family: More than three times a week   Frequency of Social Gatherings with Friends and Family: More than three times a week   Attends Religious Services: More than 4 times per year   Active Member of [employer] or Organizations: No   Attends Archivist Meetings: Never   Marital Status: Married    Activities of Daily Living In your present state of health, do you have any difficulty performing the following activities: 09/08/2021  Hearing? N  Vision? Y  Comment due to macular degeneration  Difficulty concentrating or making decisions? Y  Comment the stress of her eyes she feels like she looses concentrating.  Walking or climbing stairs? N  Dressing or bathing? N  Doing errands, shopping? Y  Comment she is not able to drive due to her macular degeneration.  Preparing Food and eating ? N  Using the Toilet? N  In the past six months, have you accidently leaked urine? Y  Comment usually at night.  Do you have problems with loss of bowel control? N  Managing your Medications? N  Managing your Finances? Y  Comment due to her vision; so her husband handles that.  Housekeeping or  managing your Housekeeping? Y  Comment due to her vision; so her husband handles that.  Some recent data might be hidden    Patient Education/ Literacy How often do you need to have someone help you when you read instructions, pamphlets, or other written materials from your doctor or pharmacy?: 1 - Never What is the last grade level you completed in school?: Bachelor's degree  Exercise Current Exercise Habits: The patient does not participate in regular exercise at present, Exercise limited by: None identified  Diet Patient reports consuming 3 meals a day and 2 snack(s) a day Patient reports that her primary diet is: Regular Patient reports that she does have regular access to food.   Depression Screen PHQ 2/9 Scores 09/08/2021 07/08/2021 07/22/2020 07/07/2020 07/22/2019 07/08/2019 01/01/2019  PHQ - 2 Score 0 0 1 1 1  0 0  PHQ- 9 Score - - - - - - -     Fall Risk Fall Risk  09/08/2021 07/08/2021 01/05/2021 07/22/2020 07/22/2019  Falls in the past year? 0 0 0 0 0  Number falls in past yr: 0 0 0 0 0  Injury with Fall? 0 0 0 0 0  Risk for fall due to : No Fall Risks - No Fall Risks Impaired vision -  Follow up Falls evaluation completed Falls prevention discussed;Falls evaluation completed Falls prevention discussed;Falls evaluation completed Falls evaluation completed;Education provided;Falls prevention discussed Falls prevention discussed  Comment - - - - -     Objective:  [name] seemed alert and oriented and she participated appropriately during our telephone visit.  Blood Pressure Weight BMI  BP Readings from Last 3 Encounters:  07/08/21 128/67  01/05/21 133/65  09/21/20 118/65   Wt Readings from Last 3 Encounters:  07/08/21 127 lb (57.6 kg)  01/05/21 128 lb (58.1 kg)  09/21/20 128 lb (58.1 kg)   BMI Readings from Last 1 Encounters:  07/08/21 22.50 kg/m    *Unable to obtain current vital signs, weight, and BMI due to telephone visit type  Hearing/Vision  Jeris did not  seem to have difficulty with hearing/understanding during the telephone conversation Reports that  she has had a formal eye exam by an eye care professional within the past year Reports that she has not had a formal hearing evaluation within the past year *Unable to fully assess hearing and vision during telephone visit type  Cognitive Function: 6CIT Screen 09/08/2021 07/22/2020 07/22/2019 07/18/2018 07/17/2017  What Year? 0 points 0 points 0 points 0 points 0 points  What month? 0 points 0 points 0 points 0 points 0 points  What time? 0 points 0 points 0 points 0 points 0 points  Count back from 20 0 points 0 points 0 points 0 points 0 points  Months in reverse 0 points 0 points 0 points 2 points 0 points  Repeat phrase 0 points 0 points 2 points 2 points 0 points  Total Score 0 0 2 4 0   (Normal:0-7, Significant for Dysfunction: >8)  Normal Cognitive Function Screening: Yes   Immunization & Health Maintenance Record Immunization History  Administered Date(s) Administered   Fluad Quad(high Dose 65+) 06/06/2020   Influenza Split 05/07/2012   Influenza, High Dose Seasonal PF 05/28/2013, 05/02/2017, 05/07/2018, 05/18/2019, 05/31/2021   Influenza,inj,Quad PF,6+ Mos 04/23/2014, 05/14/2015   Influenza-Unspecified 05/09/2016   PFIZER(Purple Top)SARS-COV-2 Vaccination 08/21/2019, 09/11/2019, 06/02/2020   Pneumococcal Conjugate-13 07/01/2014   Pneumococcal Polysaccharide-23 08/01/2005   Td 08/01/2001   Tdap 08/01/2005, 10/07/2015    Health Maintenance  Topic Date Due   COVID-19 Vaccine (4 - Booster for [place] series) 09/24/2021 (Originally 07/28/2020)   Zoster Vaccines- Shingrix (1 of 2) 10/06/2021 (Originally 11/02/1951)   DEXA SCAN  07/22/2023   TETANUS/TDAP  10/06/2025   Pneumonia Vaccine 61+ Years old  Completed   INFLUENZA VACCINE  Completed   HPV VACCINES  Aged Out       Assessment  This is a routine wellness examination for [name].  Health Maintenance: Due or  Overdue There are no preventive care reminders to display for this patient.   [name] does not need a referral for Community Assistance: Care Management:   no Social Work:    no Prescription Assistance:  no Nutrition/Diabetes Education:  no   Plan:  Personalized Goals  Goals Addressed               This Visit's Progress     Patient Stated (pt-stated)        09/08/2021 AWV Goal: Exercise for General Health  Patient will verbalize understanding of the benefits of increased physical activity: Exercising regularly is important. It will improve your overall fitness, flexibility, and endurance. Regular exercise also will improve your overall health. It can help you control your weight, reduce stress, and improve your bone density. Over the next year, patient will increase physical activity as tolerated with a goal of at least 150 minutes of moderate physical activity per week.  You can tell that you are exercising at a moderate intensity if your heart starts beating faster and you start breathing faster but can still hold a conversation. Moderate-intensity exercise ideas include: Walking 1 mile (1.6 km) in about 15 minutes Biking Hiking Golfing Dancing Water aerobics Patient will verbalize understanding of everyday activities that increase physical activity by providing examples like the following: Yard work, such as: Sales promotion account executive Gardening Washing windows or floors Patient will be able to explain general safety guidelines for exercising:  Before you start a new exercise program, talk with your health care provider. Do not exercise so  much that you hurt yourself, feel dizzy, or get very short of breath. Wear comfortable clothes and wear shoes with good support. Drink plenty of water while you exercise to prevent dehydration or heat stroke. Work out until your breathing and your  heartbeat get faster.        Personalized Health Maintenance & Screening Recommendations  Shingrix vaccine  Lung Cancer Screening Recommended: no (Low Dose CT Chest recommended if Age 52-80 years, 30 pack-year currently smoking OR have quit w/in past 15 years) Hepatitis C Screening recommended: no HIV Screening recommended: no  Advanced Directives: Written information was not prepared per patient's request.  Referrals & Orders No orders of the defined types were placed in this encounter.   Follow-up Plan Follow-up with [name], MD as planned Schedule your Shingrix vaccine at your pharmacy. AVS printed and mailed to the patient.    I have personally reviewed and noted the following in the patients chart:   Medical and social history Use of alcohol, tobacco or illicit drugs  Current medications and supplements Functional ability and status Nutritional status Physical activity Advanced directives List of other physicians Hospitalizations, surgeries, and ER visits in previous 12 months Vitals Screenings to include cognitive, depression, and falls Referrals and appointments  In addition, I have reviewed and discussed with [name] certain preventive protocols, quality metrics, and best practice recommendations. A written personalized care plan for preventive services as well as general preventive health recommendations is available and can be mailed to the patient at her request.      Tinnie Gens, RN  09/08/2021

## 2021-09-08 NOTE — Patient Instructions (Addendum)
[place] Maintenance Summary and Written Plan of Care  [name] ,  Thank you for allowing me to perform your Medicare Annual Wellness Visit and for your ongoing commitment to your health.   Health Maintenance & Immunization History Health Maintenance  Topic Date Due   COVID-19 Vaccine (4 - Booster for Pfizer series) 09/24/2021 (Originally 07/28/2020)   Zoster Vaccines- Shingrix (1 of 2) 10/06/2021 (Originally 11/02/1951)   DEXA SCAN  07/22/2023   TETANUS/TDAP  10/06/2025   Pneumonia Vaccine 65+ Years old  Completed   INFLUENZA VACCINE  Completed   HPV VACCINES  Aged Out   Immunization History  Administered Date(s) Administered   Fluad Quad(high Dose 65+) 06/06/2020   Influenza Split 05/07/2012   Influenza, High Dose Seasonal PF 05/28/2013, 05/02/2017, 05/07/2018, 05/18/2019, 05/31/2021   Influenza,inj,Quad PF,6+ Mos 04/23/2014, 05/14/2015   Influenza-Unspecified 05/09/2016   PFIZER(Purple Top)SARS-COV-2 Vaccination 08/21/2019, 09/11/2019, 06/02/2020   Pneumococcal Conjugate-13 07/01/2014   Pneumococcal Polysaccharide-23 08/01/2005   Td 08/01/2001   Tdap 08/01/2005, 10/07/2015    These are the patient goals that we discussed:  Goals Addressed               This Visit's Progress     Patient Stated (pt-stated)        09/08/2021 AWV Goal: Exercise for General Health  Patient will verbalize understanding of the benefits of increased physical activity: Exercising regularly is important. It will improve your overall fitness, flexibility, and endurance. Regular exercise also will improve your overall health. It can help you control your weight, reduce stress, and improve your bone density. Over the next year, patient will increase physical activity as tolerated with a goal of at least 150 minutes of moderate physical activity per week.  You can tell that you are exercising at a moderate intensity if your heart starts beating faster and you start  breathing faster but can still hold a conversation. Moderate-intensity exercise ideas include: Walking 1 mile (1.6 km) in about 15 minutes Biking Hiking Golfing Dancing Water aerobics Patient will verbalize understanding of everyday activities that increase physical activity by providing examples like the following: Yard work, such as: Sales promotion account executive Gardening Washing windows or floors Patient will be able to explain general safety guidelines for exercising:  Before you start a new exercise program, talk with your health care provider. Do not exercise so much that you hurt yourself, feel dizzy, or get very short of breath. Wear comfortable clothes and wear shoes with good support. Drink plenty of water while you exercise to prevent dehydration or heat stroke. Work out until your breathing and your heartbeat get faster.          This is a list of Health Maintenance Items that are overdue or due now: Shingrix vaccine  Orders/Referrals Placed Today: No orders of the defined types were placed in this encounter.  (Contact our referral department at [phone] if you have not spoken with someone about your referral appointment within the next 5 days)    Follow-up Plan Follow-up with [name], MD as planned Schedule your Shingrix vaccine at your pharmacy. AVS printed and mailed to the patient.       Health Maintenance, Female Adopting a healthy lifestyle and getting preventive care are important in promoting health and wellness. Ask your health care provider about: The right schedule for you to have regular tests and exams. Things you can  do on your own to prevent diseases and keep yourself healthy. What should I know about diet, weight, and exercise? Eat a healthy diet  Eat a diet that includes plenty of vegetables, fruits, low-fat dairy products, and lean protein. Do not  eat a lot of foods that are high in solid fats, added sugars, or sodium. Maintain a healthy weight Body mass index (BMI) is used to identify weight problems. It estimates body fat based on height and weight. Your health care provider can help determine your BMI and help you achieve or maintain a healthy weight. Get regular exercise Get regular exercise. This is one of the most important things you can do for your health. Most adults should: Exercise for at least 150 minutes each week. The exercise should increase your heart rate and make you sweat (moderate-intensity exercise). Do strengthening exercises at least twice a week. This is in addition to the moderate-intensity exercise. Spend less time sitting. Even light physical activity can be beneficial. Watch cholesterol and blood lipids Have your blood tested for lipids and cholesterol at 86 years of age, then have this test every 5 years. Have your cholesterol levels checked more often if: Your lipid or cholesterol levels are high. You are older than 86 years of age. You are at high risk for heart disease. What should I know about cancer screening? Depending on your health history and family history, you may need to have cancer screening at various ages. This may include screening for: Breast cancer. Cervical cancer. Colorectal cancer. Skin cancer. Lung cancer. What should I know about heart disease, diabetes, and high blood pressure? Blood pressure and heart disease High blood pressure causes heart disease and increases the risk of stroke. This is more likely to develop in people who have high blood pressure readings or are overweight. Have your blood pressure checked: Every 3-5 years if you are 8-61 years of age. Every year if you are 60 years old or older. Diabetes Have regular diabetes screenings. This checks your fasting blood sugar level. Have the screening done: Once every three years after age 60 if you are at a normal weight  and have a low risk for diabetes. More often and at a younger age if you are overweight or have a high risk for diabetes. What should I know about preventing infection? Hepatitis B If you have a higher risk for hepatitis B, you should be screened for this virus. Talk with your health care provider to find out if you are at risk for hepatitis B infection. Hepatitis C Testing is recommended for: Everyone born from [birth year] through 1963. Anyone with known risk factors for hepatitis C. Sexually transmitted infections (STIs) Get screened for STIs, including gonorrhea and chlamydia, if: You are sexually active and are younger than 86 years of age. You are older than 86 years of age and your health care provider tells you that you are at risk for this type of infection. Your sexual activity has changed since you were last screened, and you are at increased risk for chlamydia or gonorrhea. Ask your health care provider if you are at risk. Ask your health care provider about whether you are at high risk for HIV. Your health care provider may recommend a prescription medicine to help prevent HIV infection. If you choose to take medicine to prevent HIV, you should first get tested for HIV. You should then be tested every 3 months for as long as you are taking the medicine. Pregnancy If you  are about to stop having your period (premenopausal) and you may become pregnant, seek counseling before you get pregnant. Take 400 to 800 micrograms (mcg) of folic acid every day if you become pregnant. Ask for birth control (contraception) if you want to prevent pregnancy. Osteoporosis and menopause Osteoporosis is a disease in which the bones lose minerals and strength with aging. This can result in bone fractures. If you are 9 years old or older, or if you are at risk for osteoporosis and fractures, ask your health care provider if you should: Be screened for bone loss. Take a calcium or vitamin D supplement to lower  your risk of fractures. Be given hormone replacement therapy (HRT) to treat symptoms of menopause. Follow these instructions at home: Alcohol use Do not drink alcohol if: Your health care provider tells you not to drink. You are pregnant, may be pregnant, or are planning to become pregnant. If you drink alcohol: Limit how much you have to: 0-1 drink a day. Know how much alcohol is in your drink. In the [place] one drink equals one 12 oz bottle of beer (355 mL), one 5 oz glass of wine (148 mL), or one 1 oz glass of hard liquor (44 mL). Lifestyle Do not use any products that contain nicotine or tobacco. These products include cigarettes, chewing tobacco, and vaping devices, such as e-cigarettes. If you need help quitting, ask your health care provider. Do not use street drugs. Do not share needles. Ask your health care provider for help if you need support or information about quitting drugs. General instructions Schedule regular health, dental, and eye exams. Stay current with your vaccines. Tell your health care provider if: You often feel depressed. You have ever been abused or do not feel safe at home. Summary Adopting a healthy lifestyle and getting preventive care are important in promoting health and wellness. Follow your health care provider's instructions about healthy diet, exercising, and getting tested or screened for diseases. Follow your health care provider's instructions on monitoring your cholesterol and blood pressure. This information is not intended to replace advice given to you by your health care provider. Make sure you discuss any questions you have with your health care provider. Document Revised: 12/07/2020 Document Reviewed: 12/07/2020 Elsevier Patient Education  [place].

## 2021-09-09 ENCOUNTER — Other Ambulatory Visit: Payer: Self-pay

## 2021-09-09 ENCOUNTER — Ambulatory Visit (INDEPENDENT_AMBULATORY_CARE_PROVIDER_SITE_OTHER): Payer: Medicare HMO | Admitting: Pharmacist

## 2021-09-09 DIAGNOSIS — I700 Atherosclerosis of aorta: Secondary | ICD-10-CM

## 2021-09-09 DIAGNOSIS — I10 Essential (primary) hypertension: Secondary | ICD-10-CM

## 2021-09-09 DIAGNOSIS — J449 Chronic obstructive pulmonary disease, unspecified: Secondary | ICD-10-CM

## 2021-09-09 NOTE — Progress Notes (Signed)
Chronic Care Management Pharmacy Note  09/09/2021 Name:  AUSTYNN PRIDMORE MRN:  [id number] DOB:  04-Jul-1933  Summary: addressed HTN, HLD, COPD, insomnia. Previously recommended OTC melatonin for insomnia, answered more OTC questions related to melatonin and pain relief herbal/alternative products.   Patient describes home BP readings as follows:  137/65, 126/78, 135/65, 125/71  Recommendations/Changes made from today's visit: none  Plan: f/u with pharmacist in 6 months  Subjective: BANESA TRISTAN is an 88 y.o. year old female who is a primary patient of Metheney, Rene Kocher, MD.  The CCM team was consulted for assistance with disease management and care coordination needs.    Engaged with patient by telephone for follow up visit in response to provider referral for pharmacy case management and/or care coordination services.   Consent to Services:  The patient was given information about Chronic Care Management services, agreed to services, and gave verbal consent prior to initiation of services.  Please see initial visit note for detailed documentation.   Patient Care Team: [name], MD as PCP - General (Family Medicine) Renelda Loma, [place] (Optometry) Crista Luria, MD as Attending Physician (Dermatology) Melida Quitter, MD as Attending Physician (Otolaryngology) Toribio Harbour, DC (Chiropractic Medicine) Darius Bump, [facility] as Pharmacist (Pharmacist)  Objective:  Lab Results  Component Value Date   CREATININE 0.79 07/08/2021   CREATININE 0.85 01/05/2021   CREATININE 0.92 (H) 09/21/2020       Component Value Date/Time   CHOL 161 01/05/2021 0000   TRIG 72 01/05/2021 0000   HDL 68 01/05/2021 0000   CHOLHDL 2.4 01/05/2021 0000   VLDL 20 07/13/2016 1124   LDLCALC 78 01/05/2021 0000    Hepatic Function Latest Ref Rng & Units 01/05/2021 09/21/2020 07/07/2020  Total Protein 6.1 - 8.1 g/dL 6.6 6.5 6.4  Albumin 3.6 - 5.1 g/dL - - -  AST 10 - 35 U/L 19 19 20   ALT 6 - 29  U/L 20 18 19   Alk Phosphatase 33 - 130 U/L - - -  Total Bilirubin 0.2 - 1.2 mg/dL 0.7 0.9 0.6    Lab Results  Component Value Date/Time   TSH 1.520 07/01/2014 10:07 AM   TSH 1.341 06/24/2013 09:31 AM    CBC Latest Ref Rng & Units 07/08/2021 06/15/2012  WBC 3.8 - 10.8 Thousand/uL 7.6 5.8  Hemoglobin 11.7 - 15.5 g/dL 15.2 14.4  Hematocrit 35.0 - 45.0 % 46.4(H) 43.1  Platelets 140 - 400 Thousand/uL 214 258    Lab Results  Component Value Date/Time   VD25OH 46 07/02/2018 10:57 AM   VD25OH 51 06/15/2012 11:07 AM    Social History   Tobacco Use  Smoking Status Never  Smokeless Tobacco Never   BP Readings from Last 3 Encounters:  07/08/21 128/67  01/05/21 133/65  09/21/20 118/65   Pulse Readings from Last 3 Encounters:  07/08/21 69  01/05/21 (!) 59  09/21/20 62   Wt Readings from Last 3 Encounters:  07/08/21 127 lb (57.6 kg)  01/05/21 128 lb (58.1 kg)  09/21/20 128 lb (58.1 kg)    Assessment: Review of patient past medical history, allergies, medications, health status, including review of consultants reports, laboratory and other test data, was performed as part of comprehensive evaluation and provision of chronic care management services.   SDOH:  (Social Determinants of Health) assessments and interventions performed:    CCM Care Plan  Allergies  Allergen Reactions   Erythromycin Swelling   Penicillins Swelling, Rash and Hives  Statins Other (See Comments)    Muscle aches    Medications Reviewed Today     Reviewed by Darius Bump, [facility] (Pharmacist) on 09/09/21 at [phone]  Med List Status: <None>   Medication Order Taking? Sig Documenting Provider Last Dose Status Informant  albuterol (VENTOLIN HFA) 108 (90 Base) MCG/ACT inhaler [id number] Yes INHALE 2 PUFFS BY MOUTH EVERY 6 HOURS IF NEEDED. [name], MD Taking Active   amLODipine [facility]) 5 MG tablet [id number] Yes TAKE 1 TABLET BY MOUTH EVERY DAY [name], MD Taking Active    [facility] ELLIPTA 62.5-25 MCG/ACT AEPB [id number] Yes INHALE 1 PUFF BY MOUTH EVERY DAY [name], MD Taking Active   atorvastatin (LIPITOR) 10 MG tablet [id number] Yes TAKE 1 TABLET BY MOUTH EVERYDAY AT BEDTIME [name], MD Taking Active   azelastine (OPTIVAR) 0.05 % ophthalmic solution [id number] Yes Apply 1 drop to eye 4 (four) times daily. Uses it as needed. [provider] Taking Active   B Complex Vitamins (VITAMIN B COMPLEX PO) [id number] Yes Take 1 tablet by mouth every other day. [provider] Taking Active   Calcium Citrate-Vitamin D (CALCIUM CITRATE + D PO) [id number] Yes Take 1 tablet by mouth 2 (two) times daily. [provider] Taking Active   Cholecalciferol (VITAMIN D3) 1000 units CAPS [id number] Yes Take 1 capsule by mouth 3 (three) times daily. [provider] Taking Active   losartan (COZAAR) 100 MG tablet [id number] Yes Take 1 tablet (100 mg total) by mouth daily. [name], MD Taking Active   Magnesium 200 MG TABS [id number] Yes Take 4 tablets by mouth daily. [provider] Taking Active Self  melatonin 5 MG TABS [id number] Yes Take 5 mg by mouth. [provider] Taking Active   Multiple Vitamins-Minerals (PRESERVISION AREDS 2) CAPS [id number] Yes Take 1 tablet by mouth 2 (two) times daily. [provider] Taking Active   OVER THE COUNTER MEDICATION [id number] Yes Use as directed 2 Scoops in the mouth or throat 2 (two) times daily as needed. Joint Vibrance: chondroitin, MSM, glucosamine and others. 2 tsp twice daily as needed if pain/aches flare up. [provider] Taking Active   vitamin C (ASCORBIC ACID) 500 MG tablet [id number] Yes Take 500 mg by mouth daily. 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   Vitamin D deficiency 06/15/2012   Internal hemorrhoid 06/15/2012   COPD (chronic obstructive pulmonary disease) ([place]) 06/15/2012   Palpitations 03/29/2012   Asthma, exercise induced    Macular degeneration     Immunization History  Administered Date(s) Administered   Fluad Quad(high Dose 65+) 06/06/2020   Influenza Split 05/07/2012   Influenza, High Dose Seasonal PF 05/28/2013, 05/02/2017, 05/07/2018, 05/18/2019, 05/31/2021   Influenza,inj,Quad PF,6+ Mos 04/23/2014, 05/14/2015   Influenza-Unspecified 05/09/2016   PFIZER(Purple Top)SARS-COV-2 Vaccination 08/21/2019, 09/11/2019, 06/02/2020   Pneumococcal Conjugate-13 07/01/2014   Pneumococcal Polysaccharide-23 08/01/2005   Td 08/01/2001   Tdap 08/01/2005, 10/07/2015  Conditions to be addressed/monitored: HTN, HLD, COPD, and insomnia  Care Plan : Medication Management  Updates made by Darius Bump, [place] since 09/09/2021 12:00 AM     Problem: HTN, COPD      Long-Range Goal: Disease Progression Prevention   Priority: High  Note:   Current Barriers:  None at this time   Pharmacist Clinical Goal(s):  Over the next 180 days, patient will adhere to prescribed medication regimen as evidenced by fill history, routines, and pillbox through collaboration with PharmD and provider.    Interventions: 1:1 collaboration with [name], MD regarding development and update of comprehensive plan of care as evidenced by provider attestation and co-signature Inter-disciplinary care team collaboration (see longitudinal plan of care) Comprehensive medication review performed; medication list updated in electronic  medical record   Hypertension:            Controlled; current treatment: amlodipine 93m, losartan 1055m             Current home readings: 137/65, 126/78, 135/65, 125/71            Denies hypotensive/hypertensive symptoms            Recommended continue current regimen    Hyperlipidemia:            Controlled; current treatment: atorvastatin 1038m             Recommended continue current regimen;    Chronic Obstructive Pulmonary Disease:            Controlled; current treatment: Anoro Ellipta, albuterol PRN;             0 exacerbations requiring treatment in the last 6 months             Recommended continue current regimen  Insomnia:             Controlled; current treatment: melatonin 5mg74m           Recommended continue current regimen, advised that patient can take additional dose (total = 10mg79mimum) if needed on particularly troublesome nights.   Patient Goals/Self-Care Activities Over the next 180 days, patient will:  take medications as prescribed   Follow Up Plan: Telephone follow up appointment with care management team member scheduled for:  6 months     Medication Assistance: None required.  Patient affirms current coverage meets needs.  Patient's preferred pharmacy is:  [id number] [address] [address]: [phone] [facility][id number] [phone] box? Yes Pt endorses 100% compliance  Follow Up:  Patient agrees to Care Plan and Follow-up.  Plan: Telephone follow up appointment with care management team member scheduled for:  6 months  Ahonesty Woodfin Larinda ButteryD Clinical Pharmacist [phone]

## 2021-09-09 NOTE — Patient Instructions (Signed)
Visit Information  Thank you for taking time to visit with me today. Please don't hesitate to contact me if I can be of assistance to you before our next scheduled telephone appointment.  Following are the goals we discussed today:  Patient Goals/Self-Care Activities Over the next 180 days, patient will:  take medications as prescribed   Follow Up Plan: Telephone follow up appointment with care management team member scheduled for:  6 months  Please call the care guide team at [phone] if you need to cancel or reschedule your appointment.    Patient verbalizes understanding of instructions and care plan provided today and agrees to view in [place]. Active MyChart status confirmed with patient.    Darius Bump

## 2021-09-24 DIAGNOSIS — Z0100 Encounter for examination of eyes and vision without abnormal findings: Secondary | ICD-10-CM | POA: Diagnosis not present

## 2021-09-28 DIAGNOSIS — I10 Essential (primary) hypertension: Secondary | ICD-10-CM | POA: Diagnosis not present

## 2021-09-28 DIAGNOSIS — J449 Chronic obstructive pulmonary disease, unspecified: Secondary | ICD-10-CM

## 2021-10-06 DIAGNOSIS — H35371 Puckering of macula, right eye: Secondary | ICD-10-CM | POA: Diagnosis not present

## 2021-10-06 DIAGNOSIS — D3132 Benign neoplasm of left choroid: Secondary | ICD-10-CM | POA: Diagnosis not present

## 2021-10-06 DIAGNOSIS — H353231 Exudative age-related macular degeneration, bilateral, with active choroidal neovascularization: Secondary | ICD-10-CM | POA: Diagnosis not present

## 2021-10-06 DIAGNOSIS — H43813 Vitreous degeneration, bilateral: Secondary | ICD-10-CM | POA: Diagnosis not present

## 2021-12-15 DIAGNOSIS — D3132 Benign neoplasm of left choroid: Secondary | ICD-10-CM | POA: Diagnosis not present

## 2021-12-15 DIAGNOSIS — H43813 Vitreous degeneration, bilateral: Secondary | ICD-10-CM | POA: Diagnosis not present

## 2021-12-15 DIAGNOSIS — H353231 Exudative age-related macular degeneration, bilateral, with active choroidal neovascularization: Secondary | ICD-10-CM | POA: Diagnosis not present

## 2021-12-15 DIAGNOSIS — H35371 Puckering of macula, right eye: Secondary | ICD-10-CM | POA: Diagnosis not present

## 2021-12-20 ENCOUNTER — Other Ambulatory Visit: Payer: Self-pay | Admitting: Family Medicine

## 2021-12-20 DIAGNOSIS — I700 Atherosclerosis of aorta: Secondary | ICD-10-CM

## 2021-12-24 ENCOUNTER — Other Ambulatory Visit: Payer: Self-pay | Admitting: Family Medicine

## 2021-12-24 DIAGNOSIS — I10 Essential (primary) hypertension: Secondary | ICD-10-CM

## 2021-12-24 DIAGNOSIS — J449 Chronic obstructive pulmonary disease, unspecified: Secondary | ICD-10-CM

## 2022-01-06 ENCOUNTER — Encounter: Payer: Self-pay | Admitting: Family Medicine

## 2022-01-06 ENCOUNTER — Ambulatory Visit (INDEPENDENT_AMBULATORY_CARE_PROVIDER_SITE_OTHER): Payer: Medicare HMO | Admitting: Family Medicine

## 2022-01-06 VITALS — BP 166/74 | HR 64 | Resp 16 | Ht 63.0 in | Wt 127.0 lb

## 2022-01-06 DIAGNOSIS — J449 Chronic obstructive pulmonary disease, unspecified: Secondary | ICD-10-CM | POA: Diagnosis not present

## 2022-01-06 DIAGNOSIS — I10 Essential (primary) hypertension: Secondary | ICD-10-CM | POA: Diagnosis not present

## 2022-01-06 DIAGNOSIS — I700 Atherosclerosis of aorta: Secondary | ICD-10-CM | POA: Diagnosis not present

## 2022-01-06 DIAGNOSIS — R413 Other amnesia: Secondary | ICD-10-CM

## 2022-01-06 DIAGNOSIS — Z23 Encounter for immunization: Secondary | ICD-10-CM | POA: Diagnosis not present

## 2022-01-06 DIAGNOSIS — H3530 Unspecified macular degeneration: Secondary | ICD-10-CM

## 2022-01-06 NOTE — Assessment & Plan Note (Signed)
Blood pressure here today is elevated but reports getting good blood pressures at home that is been her pattern over the several years that have known her.  We will continue to monitor carefully encouraged her to just continue to check it once or twice a week.

## 2022-01-06 NOTE — Assessment & Plan Note (Signed)
Fortunately she has had significant vision loss and this has made it difficult for her to read which she really enjoys.  And she is no longer able to drive it also affected her socially as it makes it more difficult to recognize people unless they are extremely close to her.  But she still engaging with her friends at church.  And her husband who is here today is very supportive.

## 2022-01-06 NOTE — Progress Notes (Signed)
Established Patient Office Visit  Subjective   Patient ID: [name], female    DOB: 1933-05-01  Age: 88 y.o. MRN: [id number]  Chief Complaint  Patient presents with   Hypertension    Follow up. Fasting labs today    Memory Loss    Patient states she has been having trouble with memory for 6 months.     HPI  Hypertension- Pt denies chest pain, SOB, dizziness, or heart palpitations.  Taking meds as directed w/o problems.  Denies medication side effects.  She reports that her home blood pressures usually look good especially if she sits for about 5 or 10 minutes the usually will run in the 120s or 130s.  Memory problems -she did want to discuss that she has been noticing some more memory issues she feels like it may have started around the time that she started her statin and she did read online that that could cause some memory issues.  She no longer does the finances for the house her husband has taken over that.  But several years ago she was actually helping her mother with her finances.  She no longer drives but that is because of her vision loss from her macular degeneration.  Unfortunately still is still having to receive injections about every 10 weeks for that.  She has been a little bit more forgetful with names and sometimes even faces though she feels like she contributes that a lot to the vision changes.  She admits to just feeling a little bit more down and depressed in part because of the vision.  It is really limited her ability to drive and its also limit her and her ability to read which she normally really enjoys.  But says she does not want to take any medication for mood.  F/U COPD - No recent changes.      ROS    Objective:     BP (!) 166/74   Pulse 64   Resp 16   Ht '5\' 3"'$  (1.6 m)   Wt 127 lb (57.6 kg)   SpO2 98%   BMI 22.50 kg/m    Physical Exam Vitals and nursing note reviewed.  Constitutional:      Appearance: She is well-developed.  HENT:      Head: Normocephalic and atraumatic.  Cardiovascular:     Rate and Rhythm: Normal rate and regular rhythm.     Heart sounds: Normal heart sounds.  Pulmonary:     Effort: Pulmonary effort is normal.     Breath sounds: Normal breath sounds.  Skin:    General: Skin is warm and dry.  Neurological:     Mental Status: She is alert and oriented to person, place, and time.  Psychiatric:        Behavior: Behavior normal.      No results found for any visits on 01/06/22.    The ASCVD Risk score (Arnett DK, et al., 2019) failed to calculate for the following reasons:   The 2019 ASCVD risk score is only valid for ages 41 to 94    Assessment & Plan:   Problem List Items Addressed This Visit       Cardiovascular and Mediastinum   Essential hypertension - Primary    Blood pressure here today is elevated but reports getting good blood pressures at home that is been her pattern over the several years that have known her.  We will continue to monitor carefully encouraged her to just  continue to check it once or twice a week.      Relevant Orders   CBC   COMPLETE METABOLIC PANEL WITH GFR   Lipid Panel w/reflex Direct LDL   Aortic atherosclerosis ([place])    She is on a statin for her lipids and atherosclerosis.  But she is concerned that it may be contributing to some memory issues.  So we did discuss holding the statin for 1 month to see if she notices any significant improvement.  And if she does not then I want her to restart the statin.  And if she does notice improvement then to please let me know and we may try an alternative statin.        Respiratory   COPD (chronic obstructive pulmonary disease) ([place])     Other   Memory impairment    01/06/2022: MOCA score of 21/30 today.  She lost points for clock draw, attention, and delayed recall.  Reviewed results with her and her husband today.  Discussed options.  She feels like her statin could be contributing so I think would be perfectly  reasonable to hold the statin for a month to see if she notices a difference.  If not then we discussed moving forward with medication to help stabilize her memory and slow the decline such as Aricept etc.  She is open to it but does want to try holding the statin for a month first.       Macular degeneration    Fortunately she has had significant vision loss and this has made it difficult for her to read which she really enjoys.  And she is no longer able to drive it also affected her socially as it makes it more difficult to recognize people unless they are extremely close to her.  But she still engaging with her friends at church.  And her husband who is here today is very supportive.      Other Visit Diagnoses     Need for pneumococcal 20-valent conjugate vaccination       Relevant Orders   Pneumococcal conjugate vaccine 20-valent (Prevnar 20) (Completed)       Return in about 3 months (around 04/08/2022) for memory.    Beatrice Lecher, MD

## 2022-01-06 NOTE — Assessment & Plan Note (Signed)
She is on a statin for her lipids and atherosclerosis.  But she is concerned that it may be contributing to some memory issues.  So we did discuss holding the statin for 1 month to see if she notices any significant improvement.  And if she does not then I want her to restart the statin.  And if she does notice improvement then to please let me know and we may try an alternative statin.

## 2022-01-06 NOTE — Assessment & Plan Note (Signed)
01/06/2022: MOCA score of 21/30 today.  She lost points for clock draw, attention, and delayed recall.  Reviewed results with her and her husband today.  Discussed options.  She feels like her statin could be contributing so I think would be perfectly reasonable to hold the statin for a month to see if she notices a difference.  If not then we discussed moving forward with medication to help stabilize her memory and slow the decline such as Aricept etc.  She is open to it but does want to try holding the statin for a month first.

## 2022-01-07 LAB — COMPLETE METABOLIC PANEL WITH GFR
AG Ratio: 2 (calc) (ref 1.0–2.5)
ALT: 20 U/L (ref 6–29)
AST: 24 U/L (ref 10–35)
Albumin: 4.5 g/dL (ref 3.6–5.1)
Alkaline phosphatase (APISO): 107 U/L (ref 37–153)
BUN: 17 mg/dL (ref 7–25)
CO2: 24 mmol/L (ref 20–32)
Calcium: 10.7 mg/dL — ABNORMAL HIGH (ref 8.6–10.4)
Chloride: 107 mmol/L (ref 98–110)
Creat: 0.85 mg/dL (ref 0.60–0.95)
Globulin: 2.3 g/dL (calc) (ref 1.9–3.7)
Glucose, Bld: 95 mg/dL (ref 65–99)
Potassium: 4.7 mmol/L (ref 3.5–5.3)
Sodium: 144 mmol/L (ref 135–146)
Total Bilirubin: 0.8 mg/dL (ref 0.2–1.2)
Total Protein: 6.8 g/dL (ref 6.1–8.1)
eGFR: 66 mL/min/{1.73_m2} (ref >=60)

## 2022-01-07 LAB — CBC
HCT: 46.9 % — ABNORMAL HIGH (ref 35.0–45.0)
Hemoglobin: 15.4 g/dL (ref 11.7–15.5)
MCH: 30.1 pg (ref 27.0–33.0)
MCHC: 32.8 g/dL (ref 32.0–36.0)
MCV: 91.8 fL (ref 80.0–100.0)
MPV: 11.3 fL (ref 7.5–12.5)
Platelets: 221 10*3/uL (ref 140–400)
RBC: 5.11 10*6/uL — ABNORMAL HIGH (ref 3.80–5.10)
RDW: 12.5 % (ref 11.0–15.0)
WBC: 6.4 10*3/uL (ref 3.8–10.8)

## 2022-01-07 LAB — LIPID PANEL W/REFLEX DIRECT LDL
Cholesterol: 164 mg/dL (ref <200)
HDL: 79 mg/dL (ref >=50)
LDL Cholesterol (Calc): 70 mg/dL (calc)
Non-HDL Cholesterol (Calc): 85 mg/dL (calc) (ref <130)
Total CHOL/HDL Ratio: 2.1 (calc) (ref <5.0)
Triglycerides: 70 mg/dL (ref <150)

## 2022-01-07 NOTE — Progress Notes (Signed)
Hi Regana, blood count is normal.  Metabolic panel is normal.  Cholesterol looks good.

## 2022-01-13 DIAGNOSIS — L570 Actinic keratosis: Secondary | ICD-10-CM | POA: Diagnosis not present

## 2022-01-13 DIAGNOSIS — L720 Epidermal cyst: Secondary | ICD-10-CM | POA: Diagnosis not present

## 2022-01-13 DIAGNOSIS — D2272 Melanocytic nevi of left lower limb, including hip: Secondary | ICD-10-CM | POA: Diagnosis not present

## 2022-01-13 DIAGNOSIS — L578 Other skin changes due to chronic exposure to nonionizing radiation: Secondary | ICD-10-CM | POA: Diagnosis not present

## 2022-01-13 DIAGNOSIS — L821 Other seborrheic keratosis: Secondary | ICD-10-CM | POA: Diagnosis not present

## 2022-01-13 DIAGNOSIS — D225 Melanocytic nevi of trunk: Secondary | ICD-10-CM | POA: Diagnosis not present

## 2022-01-13 DIAGNOSIS — Z85828 Personal history of other malignant neoplasm of skin: Secondary | ICD-10-CM | POA: Diagnosis not present

## 2022-01-13 DIAGNOSIS — R238 Other skin changes: Secondary | ICD-10-CM | POA: Diagnosis not present

## 2022-01-13 DIAGNOSIS — D1721 Benign lipomatous neoplasm of skin and subcutaneous tissue of right arm: Secondary | ICD-10-CM | POA: Diagnosis not present

## 2022-01-13 DIAGNOSIS — Z86018 Personal history of other benign neoplasm: Secondary | ICD-10-CM | POA: Diagnosis not present

## 2022-01-13 DIAGNOSIS — D2261 Melanocytic nevi of right upper limb, including shoulder: Secondary | ICD-10-CM | POA: Diagnosis not present

## 2022-01-16 ENCOUNTER — Other Ambulatory Visit: Payer: Self-pay | Admitting: Family Medicine

## 2022-01-16 DIAGNOSIS — J449 Chronic obstructive pulmonary disease, unspecified: Secondary | ICD-10-CM

## 2022-02-09 ENCOUNTER — Ambulatory Visit (INDEPENDENT_AMBULATORY_CARE_PROVIDER_SITE_OTHER): Payer: Medicare HMO | Admitting: Sports Medicine

## 2022-02-09 ENCOUNTER — Ambulatory Visit (INDEPENDENT_AMBULATORY_CARE_PROVIDER_SITE_OTHER): Payer: Medicare HMO

## 2022-02-09 DIAGNOSIS — M7122 Synovial cyst of popliteal space [Baker], left knee: Secondary | ICD-10-CM

## 2022-02-09 NOTE — Assessment & Plan Note (Signed)
This is a very pleasant 88 year old female, she has left knee osteoarthritis with a Baker's cyst, last treated about 3 years ago. Having a recurrence of pain in her knee and swelling in the back of the knee. Today we did an aspiration and injection of her knee joint as well as an aspiration and injection of her Baker's cyst. Return to see me in 6 weeks

## 2022-02-09 NOTE — Progress Notes (Signed)
    Procedures performed today:    Procedure: Real-time Ultrasound Guided aspiration/injection of left knee Device: Samsung HS60  Verbal informed consent obtained.  Time-out conducted.  Noted no overlying erythema, induration, or other signs of local infection.  Skin prepped in a sterile fashion.  Local anesthesia: Topical Ethyl chloride.  With sterile technique and under real time ultrasound guidance: I aspirated approximately 20 mils of clear, straw-colored fluid, syringe switched and 1 cc Kenalog 40, 2 cc lidocaine, 2 cc bupivacaine injected easily Completed without difficulty  Advised to call if fevers/chills, erythema, induration, drainage, or persistent bleeding.  Images permanently stored and available for review in PACS.  Impression: Technically successful ultrasound guided aspiration/injection.  Procedure: Real-time Ultrasound Guided aspiration/injection of left knee bakers cyst Device: Samsung HS60  Verbal informed consent obtained.  Time-out conducted.  Noted no overlying erythema, induration, or other signs of local infection.  Skin prepped in a sterile fashion.  Local anesthesia: Topical Ethyl chloride.  With sterile technique and under real time ultrasound guidance: I aspirated approximately 10 mils of clear, straw-colored fluid, syringe switched and 1 cc Kenalog 40, 1 cc lidocaine injected easily Completed without difficulty  Advised to call if fevers/chills, erythema, induration, drainage, or persistent bleeding.  Images permanently stored and available for review in PACS.  Impression: Technically successful ultrasound guided aspiration/injection.  Independent interpretation of notes and tests performed by another provider:   None.  Brief History, Exam, Impression, and Recommendations:    Baker's cyst of knee, left This is a very pleasant 88 year old female, she has left knee osteoarthritis with a Baker's cyst, last treated about 3 years ago. Having a recurrence  of pain in her knee and swelling in the back of the knee. Today we did an aspiration and injection of her knee joint as well as an aspiration and injection of her Baker's cyst. Return to see me in 6 weeks  Chronic process with exacerbation and pharmacologic intervention  ____________________________________________ Gwen Her. Dianah Field, M.D., ABFM., CAQSM., AME. Primary Care and Sports Medicine [facility]  Adjunct Professor of [employer]  Risk manager

## 2022-02-10 ENCOUNTER — Other Ambulatory Visit: Payer: Self-pay | Admitting: Family Medicine

## 2022-02-10 DIAGNOSIS — I10 Essential (primary) hypertension: Secondary | ICD-10-CM

## 2022-02-23 DIAGNOSIS — H353222 Exudative age-related macular degeneration, left eye, with inactive choroidal neovascularization: Secondary | ICD-10-CM | POA: Diagnosis not present

## 2022-02-23 DIAGNOSIS — D3132 Benign neoplasm of left choroid: Secondary | ICD-10-CM | POA: Diagnosis not present

## 2022-02-23 DIAGNOSIS — H35371 Puckering of macula, right eye: Secondary | ICD-10-CM | POA: Diagnosis not present

## 2022-02-23 DIAGNOSIS — H353211 Exudative age-related macular degeneration, right eye, with active choroidal neovascularization: Secondary | ICD-10-CM | POA: Diagnosis not present

## 2022-03-24 ENCOUNTER — Telehealth: Payer: Medicare HMO

## 2022-03-28 ENCOUNTER — Ambulatory Visit (INDEPENDENT_AMBULATORY_CARE_PROVIDER_SITE_OTHER): Payer: Medicare HMO | Admitting: Pharmacist

## 2022-03-28 DIAGNOSIS — J449 Chronic obstructive pulmonary disease, unspecified: Secondary | ICD-10-CM

## 2022-03-28 DIAGNOSIS — I10 Essential (primary) hypertension: Secondary | ICD-10-CM

## 2022-03-28 DIAGNOSIS — I700 Atherosclerosis of aorta: Secondary | ICD-10-CM

## 2022-03-28 NOTE — Progress Notes (Signed)
Chronic Care Management Pharmacy Note  03/28/2022 Name:  BRIANNIA Young MRN:  [id number] DOB:  Oct 17, 1932  Summary: addressed HTN, HLD, COPD, insomnia. Reviewed medications, answered OTC questions related to supplements.   Patient has fallen out of habit in checking BP at home.  She stopped her statin (in collaboration with PCP) related to concerns around memory impairment.  Recommendations/Changes made from today's visit:  - No changes, counseled patient on statin, cholesterol goals, and feel it is appropriate given age/current cholesterol to trial a period without medication - Provided counseling and education regarding medication for memory impairment, if she decides she wants to try this in the future - Encouraged patient to resume checking BP at home.   Plan: f/u with pharmacist in 3 months  Subjective: [name] is an 88 y.o. year old female who is a primary patient of Metheney, Rene Kocher, MD.  The CCM team was consulted for assistance with disease management and care coordination needs.    Engaged with patient by telephone for follow up visit in response to provider referral for pharmacy case management and/or care coordination services.   Consent to Services:  The patient was given information about Chronic Care Management services, agreed to services, and gave verbal consent prior to initiation of services.  Please see initial visit note for detailed documentation.   Patient Care Team: [name], MD as PCP - General (Family Medicine) Renelda Loma, [place] (Optometry) Crista Luria, MD as Attending Physician (Dermatology) Melida Quitter, MD as Attending Physician (Otolaryngology) Toribio Harbour, DC (Chiropractic Medicine) Darius Bump, [facility] as Pharmacist (Pharmacist)  Objective:  Lab Results  Component Value Date   CREATININE 0.85 01/06/2022   CREATININE 0.79 07/08/2021   CREATININE 0.85 01/05/2021       Component Value Date/Time   CHOL 164  01/06/2022 0000   TRIG 70 01/06/2022 0000   HDL 79 01/06/2022 0000   CHOLHDL 2.1 01/06/2022 0000   VLDL 20 07/13/2016 1124   LDLCALC 70 01/06/2022 0000       Latest Ref Rng & Units 01/06/2022   12:00 AM 01/05/2021   12:00 AM 09/21/2020   12:00 AM  Hepatic Function  Total Protein 6.1 - 8.1 g/dL 6.8  6.6  6.5   AST 10 - 35 U/L '24  19  19   '$ ALT 6 - 29 U/L '20  20  18   '$ Total Bilirubin 0.2 - 1.2 mg/dL 0.8  0.7  0.9     Lab Results  Component Value Date/Time   TSH 1.520 07/01/2014 10:07 AM   TSH 1.341 06/24/2013 09:31 AM       Latest Ref Rng & Units 01/06/2022   12:00 AM 07/08/2021   12:00 AM 06/15/2012   11:07 AM  CBC  WBC 3.8 - 10.8 Thousand/uL 6.4  7.6  5.8   Hemoglobin 11.7 - 15.5 g/dL 15.4  15.2  14.4   Hematocrit 35.0 - 45.0 % 46.9  46.4  43.1   Platelets 140 - 400 Thousand/uL 221  214  258     Lab Results  Component Value Date/Time   VD25OH 46 07/02/2018 10:57 AM   VD25OH 51 06/15/2012 11:07 AM    Social History   Tobacco Use  Smoking Status Never  Smokeless Tobacco Never   BP Readings from Last 3 Encounters:  01/06/22 (!) 166/74  07/08/21 128/67  01/05/21 133/65   Pulse Readings from Last 3 Encounters:  01/06/22 64  07/08/21 69  01/05/21 (!) 59  Wt Readings from Last 3 Encounters:  01/06/22 127 lb (57.6 kg)  07/08/21 127 lb (57.6 kg)  01/05/21 128 lb (58.1 kg)    Assessment: Review of patient past medical history, allergies, medications, health status, including review of consultants reports, laboratory and other test data, was performed as part of comprehensive evaluation and provision of chronic care management services.   SDOH:  (Social Determinants of Health) assessments and interventions performed:    CCM Care Plan  Allergies  Allergen Reactions   Erythromycin Swelling   Penicillins Swelling, Rash and Hives   Statins Other (See Comments)    Muscle aches    Medications Reviewed Today     Reviewed by Darius Bump, [facility] (Pharmacist) on  03/28/22 at 1034  Med List Status: <None>   Medication Order Taking? Sig Documenting Provider Last Dose Status Informant  albuterol (VENTOLIN HFA) 108 (90 Base) MCG/ACT inhaler [id number] Yes INHALE 2 PUFFS BY MOUTH EVERY 6 HOURS IF NEEDED. [name], MD Taking Active   amLODipine [facility]) 5 MG tablet [id number] Yes TAKE 1 TABLET BY MOUTH EVERY DAY [name], MD Taking Active   [facility] ELLIPTA 62.5-25 MCG/ACT AEPB [id number] Yes INHALE 1 PUFF BY MOUTH EVERY DAY [name], MD Taking Active   atorvastatin (LIPITOR) 10 MG tablet [id number] No TAKE 1 TABLET BY MOUTH EVERYDAY AT BEDTIME  Patient not taking: Reported on 03/28/2022   [name], MD Not Taking Active   azelastine (OPTIVAR) 0.05 % ophthalmic solution [id number] Yes Apply 1 drop to eye 4 (four) times daily. Uses it as needed. [provider] Taking Active   B Complex Vitamins (VITAMIN B COMPLEX PO) [id number] Yes Take 1 tablet by mouth daily. [provider] Taking Active   Calcium Citrate-Vitamin D (CALCIUM CITRATE + D PO) [id number] Yes Take 1 tablet by mouth 2 (two) times daily. [provider] Taking Active   Cholecalciferol (VITAMIN D3) 1000 units CAPS [id number] Yes Take 1 capsule by mouth 3 (three) times daily. [provider] Taking Active   losartan (COZAAR) 100 MG tablet [id number] Yes TAKE 1 TABLET BY MOUTH EVERY DAY [name], MD Taking Active   Magnesium 200 MG TABS [id number] Yes Take 3 tablets by mouth daily. 2 capsules in the morning and 1 capsule in the afternoon [provider] Taking Active Self  melatonin 5 MG TABS [id number] No Take 3 mg by mouth.  Patient not taking: Reported on 03/28/2022   [provider] Not Taking Active   Misc Natural Products (ADV TURMERIC CURCUMIN COMPLEX PO) [id number] Yes Take 1 tablet by mouth in the morning, at noon, and at bedtime. [provider] Taking Active   Multiple  Vitamins-Minerals (PRESERVISION AREDS 2) CAPS [id number] Yes Take 1 tablet by mouth 2 (two) times daily. [provider] Taking Active   OVER THE [place] [id number] No Use as directed 2 Scoops in the mouth or throat 2 (two) times daily as needed. Joint Vibrance: chondroitin, MSM, glucosamine and others. 2 tsp twice daily as needed if pain/aches flare up.  Patient not taking: Reported on 03/28/2022   [provider] Not Taking Active   vitamin C (ASCORBIC ACID) 500 MG tablet [id number] Yes Take 500 mg by mouth daily. [provider] Taking Active             Patient Active Problem List   Diagnosis Date Noted   Memory impairment 01/06/2022   Osteoporosis 07/21/2021   Chronic low back pain 01/05/2021  Nonexudative age-related macular degeneration of right eye 07/07/2020   Baker's cyst of knee, left 09/17/2018   Foreign body of leg, right, sequela 09/17/2018   Arthritis of carpometacarpal [facility]) joint of right thumb 07/17/2017   Aortic atherosclerosis ([place]) 03/20/2017   Insomnia 07/13/2016   Essential hypertension 03/10/2014   Squamous cell carcinoma of hand 06/16/2012   Vitamin D deficiency 06/15/2012   Internal hemorrhoid 06/15/2012   COPD (chronic obstructive pulmonary disease) ([place]) 06/15/2012   Palpitations 03/29/2012   Asthma, exercise induced    Macular degeneration     Immunization History  Administered Date(s) Administered   Fluad Quad(high Dose 65+) 06/06/2020   Influenza Split 05/07/2012   Influenza, High Dose Seasonal PF 05/28/2013, 05/02/2017, 05/07/2018, 05/18/2019, 05/31/2021   Influenza,inj,Quad PF,6+ Mos 04/23/2014, 05/14/2015   Influenza-Unspecified 05/09/2016   PFIZER(Purple Top)SARS-COV-2 Vaccination 08/21/2019, 09/11/2019, 06/02/2020   PNEUMOCOCCAL CONJUGATE-20 01/06/2022   Pneumococcal Conjugate-13 07/01/2014   Pneumococcal Polysaccharide-23 08/01/2005   Td 08/01/2001   Tdap 08/01/2005, 10/07/2015   Zoster Recombinat  (Shingrix) 09/05/2021, 03/11/2022    Conditions to be addressed/monitored: HTN, HLD, COPD, and insomnia  Care Plan : Medication Management  Updates made by Darius Bump, [place] since 03/28/2022 12:00 AM     Problem: HTN, COPD      Long-Range Goal: Disease Progression Prevention   Priority: High  Note:   Current Barriers:  None at this time   Pharmacist Clinical Goal(s):  Over the next 90 days, patient will adhere to prescribed medication regimen as evidenced by fill history, routines, and pillbox through collaboration with PharmD and provider.    Interventions: 1:1 collaboration with [name], MD regarding development and update of comprehensive plan of care as evidenced by provider attestation and co-signature Inter-disciplinary care team collaboration (see longitudinal plan of care) Comprehensive medication review performed; medication list updated in electronic medical record   Hypertension:            Controlled; current treatment: amlodipine '5mg'$ , losartan '100mg'$ ;             Current home readings: not currently checking            Denies hypotensive/hypertensive symptoms            Recommended continue current regimen    Hyperlipidemia:            Controlled; current treatment: atorvastatin '10mg'$ ,; paused for now due to concerns of memory impairment            Recommended continue current regimen;    Chronic Obstructive Pulmonary Disease:            Controlled; current treatment: Anoro Ellipta, albuterol PRN;             0 exacerbations requiring treatment in the last 6 months             Recommended continue current regimen  Insomnia:             Controlled; current treatment: melatonin '5mg'$ ;             Recommended continue current regimen, advised that patient can take additional dose (total = '10mg'$  maximum) if needed on particularly troublesome nights.   Patient Goals/Self-Care Activities Over the next 90 days, patient will:  take medications as  prescribed, check blood pressure 1-2x per week at home, document, and provide at future discussions   Follow Up Plan: Telephone follow up appointment with care management team member scheduled for:  3 months  Medication Assistance: None required.  Patient affirms current coverage meets needs.  Patient's preferred pharmacy is:  [id number] [place]Phone: [phone]: [phone] [id number] [address]: [phone]: [phone] pill box? Yes Pt endorses 100% compliance  Follow Up:  Patient agrees to Care Plan and Follow-up.  Plan: Telephone follow up appointment with care management team member scheduled for:  6 months  KeLarinda ButteryPharmD Clinical Pharmacist CoSh[facility]Or[phone]

## 2022-03-28 NOTE — Patient Instructions (Signed)
Visit Information  Thank you for taking time to visit with me today. Please don't hesitate to contact me if I can be of assistance to you before our next scheduled telephone appointment.  Following are the goals we discussed today:   Patient Goals/Self-Care Activities Over the next 90 days, patient will:  take medications as prescribed, check blood pressure 1-2x per week at home, document, and provide at future discussions   Follow Up Plan: Telephone follow up appointment with care management team member scheduled for:  3 months  Please call the care guide team at [phone] if you need to cancel or reschedule your appointment.    Patient verbalizes understanding of instructions and care plan provided today and agrees to view in [place]. Active MyChart status and patient understanding of how to access instructions and care plan via MyChart confirmed with patient.     [name]

## 2022-03-31 DIAGNOSIS — I10 Essential (primary) hypertension: Secondary | ICD-10-CM | POA: Diagnosis not present

## 2022-03-31 DIAGNOSIS — J449 Chronic obstructive pulmonary disease, unspecified: Secondary | ICD-10-CM | POA: Diagnosis not present

## 2022-03-31 DIAGNOSIS — E785 Hyperlipidemia, unspecified: Secondary | ICD-10-CM

## 2022-04-07 ENCOUNTER — Telehealth: Payer: Self-pay | Admitting: Family Medicine

## 2022-04-07 NOTE — Telephone Encounter (Signed)
Patient called and asked if her and her husband need to come in December for a physical and follow up blood work. Please advise Thank you

## 2022-04-07 NOTE — Telephone Encounter (Signed)
Yes, he will need 86-monthfollow-up scheduled in December for hypertension

## 2022-04-12 ENCOUNTER — Other Ambulatory Visit: Payer: Self-pay | Admitting: Family Medicine

## 2022-04-12 DIAGNOSIS — I10 Essential (primary) hypertension: Secondary | ICD-10-CM

## 2022-04-21 ENCOUNTER — Ambulatory Visit (INDEPENDENT_AMBULATORY_CARE_PROVIDER_SITE_OTHER): Payer: Medicare HMO | Admitting: Family Medicine

## 2022-04-21 ENCOUNTER — Encounter: Payer: Self-pay | Admitting: Family Medicine

## 2022-04-21 VITALS — BP 144/65 | HR 67 | Ht 63.0 in | Wt 128.0 lb

## 2022-04-21 DIAGNOSIS — R413 Other amnesia: Secondary | ICD-10-CM

## 2022-04-21 DIAGNOSIS — I700 Atherosclerosis of aorta: Secondary | ICD-10-CM | POA: Diagnosis not present

## 2022-04-21 DIAGNOSIS — I10 Essential (primary) hypertension: Secondary | ICD-10-CM | POA: Diagnosis not present

## 2022-04-21 MED ORDER — ATORVASTATIN CALCIUM 10 MG PO TABS: 10.0000 mg | ORAL_TABLET | Freq: Every day | ORAL | 3 refills | Status: DC

## 2022-04-21 NOTE — Progress Notes (Addendum)
Established Patient Office Visit  Subjective   Patient ID: [name], female    DOB: 1932-12-23  Age: 88 y.o. MRN: [id number]  Chief Complaint  Patient presents with   Follow-up    memory     HPI  Today for follow-up of memory impairment-when I last saw her in June her MoCA score was 21 out of 30.  She was very concerned that her statin was impacting her memory so we felt like it was reasonable to hold the medication for couple months and see if she noticed improvement.  She declines retesting today.  She said she really did not notice a big difference off of the statin.  She said right now losing her sight to macular degeneration has really changed her rolled over the last 2 years.  She is not able to drive she is not able to enjoy reading.  She has tried to get out in the yard a lot because that is where she really finds peace and [place].  She likes listening to music but does not like TV.  She says she is just to the point where she cannot face another new diagnosis is just depressing and not positive and so she really does not want to be reevaluated today.  Hypertension- Pt denies chest pain, SOB, dizziness, or heart palpitations.  Taking meds as directed w/o problems.  Denies medication side effects.  She did bring in a home blood pressure log.  Does occasionally she will wake up in the middle the night and feel "antsy" and when she checks her blood pressure is high she is worried that her blood pressure is waking her up in the middle the night.     ROS    Objective:     BP (!) 144/65   Pulse 67   Ht '5\' 3"'$  (1.6 m)   Wt 128 lb (58.1 kg)   SpO2 97%   BMI 22.67 kg/m    Physical Exam Vitals and nursing note reviewed.  Constitutional:      Appearance: She is well-developed.  HENT:     Head: Normocephalic and atraumatic.  Cardiovascular:     Rate and Rhythm: Normal rate and regular rhythm.     Heart sounds: Normal heart sounds.  Pulmonary:     Effort: Pulmonary  effort is normal.     Breath sounds: Normal breath sounds.  Skin:    General: Skin is warm and dry.  Neurological:     Mental Status: She is alert and oriented to person, place, and time.  Psychiatric:        Behavior: Behavior normal.      No results found for any visits on 04/21/22.    The ASCVD Risk score (Arnett DK, et al., 2019) failed to calculate for the following reasons:   The 2019 ASCVD risk score is only valid for ages 37 to 8    Assessment & Plan:   Problem List Items Addressed This Visit       Cardiovascular and Mediastinum   Essential hypertension    Repeat blood pressure much better in the 140s she has always had a history of whitecoat.  She did bring in her home log.  Blood pressures though are quite variable.  Most of them are in the 120s and 130s but she does have a few greater than 140 but she also has a couple less than 115.      Relevant Medications   atorvastatin (LIPITOR) 10 MG  tablet   Aortic atherosclerosis ([place])    Since were not going to retest today we discussed going ahead and restarting her statin for her aortic atherosclerosis.  She says she is willing to try it again.      Relevant Medications   atorvastatin (LIPITOR) 10 MG tablet     Other   Memory impairment - Primary    She reports that she really does not want to be reevaluated today she just feels like it is depressing and negative and just does not want to deal with that.  I definitely understand where she is coming from but we did discuss that at some point we need to reevaluate to see if things are changing or if it is getting worse and if at that point there is something we can do to help it or reduce the decline then that would be important for [place] to intervene at that point I do think she understands.  Right now though she is not interested in medication.       Return in about 6 months (around 10/20/2022) for Hypertension.    Beatrice Lecher, MD

## 2022-04-21 NOTE — Assessment & Plan Note (Signed)
Since were not going to retest today we discussed going ahead and restarting her statin for her aortic atherosclerosis.  She says she is willing to try it again.

## 2022-04-22 NOTE — Assessment & Plan Note (Signed)
She reports that she really does not want to be reevaluated today she just feels like it is depressing and negative and just does not want to deal with that.  I definitely understand where she is coming from but we did discuss that at some point we need to reevaluate to see if things are changing or if it is getting worse and if at that point there is something we can do to help it or reduce the decline then that would be important for [place] to intervene at that point I do think she understands.  Right now though she is not interested in medication.

## 2022-04-22 NOTE — Assessment & Plan Note (Addendum)
Repeat blood pressure much better in the 140s she has always had a history of whitecoat.  She did bring in her home log.  Blood pressures though are quite variable.  Most of them are in the 120s and 130s but she does have a few greater than 140 but she also has a couple less than 115.

## 2022-05-04 DIAGNOSIS — H35371 Puckering of macula, right eye: Secondary | ICD-10-CM | POA: Diagnosis not present

## 2022-05-04 DIAGNOSIS — D3132 Benign neoplasm of left choroid: Secondary | ICD-10-CM | POA: Diagnosis not present

## 2022-05-04 DIAGNOSIS — H353231 Exudative age-related macular degeneration, bilateral, with active choroidal neovascularization: Secondary | ICD-10-CM | POA: Diagnosis not present

## 2022-05-04 DIAGNOSIS — H43813 Vitreous degeneration, bilateral: Secondary | ICD-10-CM | POA: Diagnosis not present

## 2022-05-31 ENCOUNTER — Other Ambulatory Visit: Payer: Self-pay | Admitting: Family Medicine

## 2022-05-31 DIAGNOSIS — I10 Essential (primary) hypertension: Secondary | ICD-10-CM

## 2022-06-13 ENCOUNTER — Ambulatory Visit (INDEPENDENT_AMBULATORY_CARE_PROVIDER_SITE_OTHER): Payer: Medicare HMO | Admitting: Pharmacist

## 2022-06-13 DIAGNOSIS — I10 Essential (primary) hypertension: Secondary | ICD-10-CM

## 2022-06-13 DIAGNOSIS — I700 Atherosclerosis of aorta: Secondary | ICD-10-CM

## 2022-06-13 NOTE — Progress Notes (Unsigned)
Chronic Care Management Pharmacy Note  06/13/2022 Name:  [name] MRN:  [id number] DOB:  1933-02-12  Summary: addressed HTN, HLD, COPD, insomnia. Reviewed medications, answered OTC questions related to supplements.  BP readings at home:  128/73 130/76  112/67 132/68 175/92 ->right after gone to bed, 12:30am 119/73 152/81, came down 131/73 151/73, came down 135/59, 124/66  HR readings at home: 61, 70, 60  Recommendations/Changes made from today's visit:  - No changes to medication - Provided counseling and education regarding nonpharmacologic insomnia measures - Encouraged patient to stop checking BP late at night if it results in more stress, it is reassuring that after her coping techniques, she gets the blood pressure back down.  Plan: f/u with pharmacist PRN   Subjective: [name] is an 88 y.o. year old female who is a primary patient of Metheney, Rene Kocher, MD.  The CCM team was consulted for assistance with disease management and care coordination needs.    Engaged with patient by telephone for follow up visit in response to provider referral for pharmacy case management and/or care coordination services.   Consent to Services:  The patient was given information about Chronic Care Management services, agreed to services, and gave verbal consent prior to initiation of services.  Please see initial visit note for detailed documentation.   Patient Care Team: [name], MD as PCP - General (Family Medicine) Renelda Loma, [place] (Optometry) Crista Luria, MD as Attending Physician (Dermatology) Melida Quitter, MD as Attending Physician (Otolaryngology) Toribio Harbour, DC (Chiropractic Medicine) Darius Bump, [facility] as Pharmacist (Pharmacist)  Objective:  Lab Results  Component Value Date   CREATININE 0.85 01/06/2022   CREATININE 0.79 07/08/2021   CREATININE 0.85 01/05/2021       Component Value Date/Time   CHOL 164 01/06/2022 0000   TRIG 70  01/06/2022 0000   HDL 79 01/06/2022 0000   CHOLHDL 2.1 01/06/2022 0000   VLDL 20 07/13/2016 1124   LDLCALC 70 01/06/2022 0000       Latest Ref Rng & Units 01/06/2022   12:00 AM 01/05/2021   12:00 AM 09/21/2020   12:00 AM  Hepatic Function  Total Protein 6.1 - 8.1 g/dL 6.8  6.6  6.5   AST 10 - 35 U/L '24  19  19   '$ ALT 6 - 29 U/L '20  20  18   '$ Total Bilirubin 0.2 - 1.2 mg/dL 0.8  0.7  0.9     Lab Results  Component Value Date/Time   TSH 1.520 07/01/2014 10:07 AM   TSH 1.341 06/24/2013 09:31 AM       Latest Ref Rng & Units 01/06/2022   12:00 AM 07/08/2021   12:00 AM 06/15/2012   11:07 AM  CBC  WBC 3.8 - 10.8 Thousand/uL 6.4  7.6  5.8   Hemoglobin 11.7 - 15.5 g/dL 15.4  15.2  14.4   Hematocrit 35.0 - 45.0 % 46.9  46.4  43.1   Platelets 140 - 400 Thousand/uL 221  214  258     Lab Results  Component Value Date/Time   VD25OH 46 07/02/2018 10:57 AM   VD25OH 51 06/15/2012 11:07 AM    Social History   Tobacco Use  Smoking Status Never  Smokeless Tobacco Never   BP Readings from Last 3 Encounters:  04/21/22 (!) 144/65  01/06/22 (!) 166/74  07/08/21 128/67   Pulse Readings from Last 3 Encounters:  04/21/22 67  01/06/22 64  07/08/21 69  Wt Readings from Last 3 Encounters:  04/21/22 128 lb (58.1 kg)  01/06/22 127 lb (57.6 kg)  07/08/21 127 lb (57.6 kg)    Assessment: Review of patient past medical history, allergies, medications, health status, including review of consultants reports, laboratory and other test data, was performed as part of comprehensive evaluation and provision of chronic care management services.   SDOH:  (Social Determinants of Health) assessments and interventions performed:  SDOH Interventions    Flowsheet Row Office Visit from 09/08/2021 in [place] Office Visit from 07/22/2020 in [place] Office Visit from 07/17/2017 in [place] Interventions Intervention Not Indicated Intervention Not Indicated --  Housing Interventions Intervention Not Indicated Intervention Not Indicated --  Transportation Interventions Intervention Not Indicated Intervention Not Indicated --  Depression Interventions/Treatment  -- -- Medication  Financial Strain Interventions Intervention Not Indicated Intervention Not Indicated --  Physical Activity Interventions Intervention Not Indicated Intervention Not Indicated --  Stress Interventions Other (Comment)  [due to her macular degeneration.] Intervention Not Indicated --  Social Connections Interventions Intervention Not Indicated Intervention Not Indicated --       CCM Care Plan  Allergies  Allergen Reactions   Erythromycin Swelling   Penicillins Swelling, Rash and Hives   Statins Other (See Comments)    Muscle aches    Medications Reviewed Today     Reviewed by Teddy Spike, CMA (Certified Medical Assistant) on 04/21/22 at 1412  Med List Status: <None>   Medication Order Taking? Sig Documenting Provider Last Dose Status Informant  albuterol (VENTOLIN HFA) 108 (90 Base) MCG/ACT inhaler [id number] Yes INHALE 2 PUFFS BY MOUTH EVERY 6 HOURS IF NEEDED. [name], MD Taking Active   amLODipine [facility]) 5 MG tablet [id number] Yes TAKE 1 TABLET BY MOUTH EVERY DAY [name], MD Taking Active   [facility] ELLIPTA 62.5-25 MCG/ACT AEPB [id number] Yes INHALE 1 PUFF BY MOUTH EVERY DAY [name], MD Taking Active   atorvastatin (LIPITOR) 10 MG tablet [id number] No TAKE 1 TABLET BY MOUTH EVERYDAY AT BEDTIME  Patient not taking: Reported on 03/28/2022   [name], MD Not Taking Active   azelastine (OPTIVAR) 0.05 % ophthalmic solution [id number] Yes Apply 1 drop to eye 4 (four) times daily. Uses it as needed. [provider] Taking Active   B Complex Vitamins (VITAMIN B COMPLEX PO) [id number] Yes Take 1 tablet by  mouth daily. [provider] Taking Active   Calcium Citrate-Vitamin D (CALCIUM CITRATE + D PO) [id number] Yes Take 1 tablet by mouth 2 (two) times daily. [provider] Taking Active   Cholecalciferol (VITAMIN D3) 1000 units CAPS [id number] Yes Take 1 capsule by mouth 3 (three) times daily. [provider] Taking Active   losartan (COZAAR) 100 MG tablet [id number] Yes TAKE 1 TABLET BY MOUTH EVERY DAY [name], MD Taking Active   Magnesium 200 MG TABS [id number] Yes Take 3 tablets by mouth daily. 2 capsules in the morning and 1 capsule in the afternoon [provider] Taking Active Self  Multiple Vitamins-Minerals (PRESERVISION AREDS 2) CAPS [id number] Yes Take 1 tablet by mouth 2 (two) times daily. [provider] Taking Active   vitamin C (ASCORBIC ACID) 500 MG tablet [id number] Yes Take 500 mg by mouth daily. 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   Vitamin D deficiency 06/15/2012   Internal hemorrhoid 06/15/2012   COPD (chronic obstructive pulmonary disease) ([place]) 06/15/2012   Palpitations 03/29/2012   Asthma, exercise induced    Macular degeneration     Immunization History  Administered Date(s) Administered   Fluad Quad(high Dose 65+) 06/06/2020, 04/25/2022   Influenza Split 05/07/2012   Influenza, High Dose Seasonal PF 05/28/2013, 05/02/2017, 05/07/2018, 05/18/2019, 05/31/2021   Influenza,inj,Quad  PF,6+ Mos 04/23/2014, 05/14/2015   Influenza-Unspecified 05/09/2016   PFIZER Comirnaty(Gray Top)Covid-19 Tri-Sucrose Vaccine 04/29/2022   PFIZER(Purple Top)SARS-COV-2 Vaccination 08/21/2019, 09/11/2019, 06/02/2020   PNEUMOCOCCAL CONJUGATE-20 01/06/2022   Pneumococcal Conjugate-13 07/01/2014   Pneumococcal Polysaccharide-23 08/01/2005   Td 08/01/2001   Tdap 08/01/2005, 10/07/2015   Zoster Recombinat (Shingrix) 09/05/2021, 03/11/2022    Conditions to be addressed/monitored: HTN, HLD, COPD, and insomnia  There are no care plans that you recently modified to display for this patient.     Medication Assistance: None required.  Patient affirms current coverage meets needs.  Patient's [facility] pharmacy [facility]:  [id number] [place] [id number]: [phone]: [phone] [id number] [address]: [phone]: [phone] pill box? Yes Pt endorses 100% compliance  Follow Up:  Patient agrees to Care Plan and Follow-up.  Plan: The patient has been provided with contact information for the care management team and has been advised to call with any health related questions or concerns.   KeLarinda ButteryPharmD Clinical Pharmacist CoMount [phone]

## 2022-06-20 ENCOUNTER — Telehealth: Payer: Medicare HMO

## 2022-07-13 DIAGNOSIS — H43813 Vitreous degeneration, bilateral: Secondary | ICD-10-CM | POA: Diagnosis not present

## 2022-07-13 DIAGNOSIS — D3132 Benign neoplasm of left choroid: Secondary | ICD-10-CM | POA: Diagnosis not present

## 2022-07-13 DIAGNOSIS — H353231 Exudative age-related macular degeneration, bilateral, with active choroidal neovascularization: Secondary | ICD-10-CM | POA: Diagnosis not present

## 2022-08-03 ENCOUNTER — Other Ambulatory Visit: Payer: Self-pay | Admitting: Family Medicine

## 2022-08-03 DIAGNOSIS — J449 Chronic obstructive pulmonary disease, unspecified: Secondary | ICD-10-CM

## 2022-08-23 ENCOUNTER — Encounter: Payer: Self-pay | Admitting: Sports Medicine

## 2022-08-23 ENCOUNTER — Ambulatory Visit (INDEPENDENT_AMBULATORY_CARE_PROVIDER_SITE_OTHER): Payer: Medicare HMO | Admitting: Sports Medicine

## 2022-08-23 ENCOUNTER — Ambulatory Visit (INDEPENDENT_AMBULATORY_CARE_PROVIDER_SITE_OTHER): Payer: Medicare HMO

## 2022-08-23 VITALS — BP 171/83 | HR 73 | Wt 127.0 lb

## 2022-08-23 DIAGNOSIS — M7122 Synovial cyst of popliteal space [Baker], left knee: Secondary | ICD-10-CM

## 2022-08-23 MED ORDER — TRIAMCINOLONE ACETONIDE 40 MG/ML IJ SUSP
80.0000 mg | Freq: Once | INTRAMUSCULAR | Status: AC
  Administered 2022-08-23: 80 mg via INTRAMUSCULAR

## 2022-08-23 NOTE — Progress Notes (Signed)
    Procedures performed today:    Procedure: Real-time Ultrasound Guided injection of left knee Device: Samsung HS60  Verbal informed consent obtained.  Time-out conducted.  Noted no overlying erythema, induration, or other signs of local infection.  Skin prepped in a sterile fashion.  Local anesthesia: Topical Ethyl chloride.  With sterile technique and under real time ultrasound guidance: Noted mild effusion, 1 cc Kenalog 40, 2 cc lidocaine, 2 cc bupivacaine injected easily Completed without difficulty  Advised to call if fevers/chills, erythema, induration, drainage, or persistent bleeding.  Images permanently stored and available for review in PACS.  Impression: Technically successful ultrasound guided injection.   Procedure: Real-time Ultrasound Guided aspiration/injection of left knee bakers cyst Device: Samsung HS60  Verbal informed consent obtained.  Time-out conducted.  Noted no overlying erythema, induration, or other signs of local infection.  Skin prepped in a sterile fashion.  Local anesthesia: Topical Ethyl chloride.  With sterile technique and under real time ultrasound guidance: I aspirated approximately 10 mils of clear, straw-colored fluid, syringe switched and 1 cc Kenalog 40, 1 cc lidocaine injected easily Completed without difficulty  Advised to call if fevers/chills, erythema, induration, drainage, or persistent bleeding.  Images permanently stored and available for review in PACS.  Impression: Technically successful ultrasound guided aspiration/injection.  Independent interpretation of notes and tests performed by another provider:   None.  Brief History, Exam, Impression, and Recommendations:    Baker's cyst of knee, left Very pleasant 89 year old female, known left knee osteoarthritis with Baker's cyst, last treated July 2023 with an aspiration and injection of the knee and the Baker's cyst. Did really well until recently, now having increasing pain  with loss of motion. Repeat aspiration and injection of the Baker's cyst in the knee joint. Adding home conditioning, declines formal physical therapy. Return to see me as needed.    ____________________________________________ Gwen Her. Dianah Field, M.D., ABFM., CAQSM., AME. Primary Care and Sports Medicine [facility]  Adjunct Professor of [employer]  Risk manager

## 2022-08-23 NOTE — Assessment & Plan Note (Signed)
Very pleasant 89 year old female, known left knee osteoarthritis with Baker's cyst, last treated July 2023 with an aspiration and injection of the knee and the Baker's cyst. Did really well until recently, now having increasing pain with loss of motion. Repeat aspiration and injection of the Baker's cyst in the knee joint. Adding home conditioning, declines formal physical therapy. Return to see me as needed.

## 2022-09-12 ENCOUNTER — Ambulatory Visit (INDEPENDENT_AMBULATORY_CARE_PROVIDER_SITE_OTHER): Payer: Medicare HMO | Admitting: Family Medicine

## 2022-09-12 VITALS — BP 129/68

## 2022-09-12 DIAGNOSIS — Z0000 Encounter for general adult medical examination without abnormal findings: Secondary | ICD-10-CM | POA: Diagnosis not present

## 2022-09-12 NOTE — Patient Instructions (Signed)
[name] Maintenance Summary and Written Plan of Care  [name] ,  Thank you for allowing me to perform your Medicare Annual Wellness Visit and for your ongoing commitment to your health.   Health Maintenance & Immunization History Health Maintenance  Topic Date Due   COVID-19 Vaccine (5 - 2023-24 season) 09/28/2022 (Originally 06/24/2022)   DEXA SCAN  07/22/2023   Medicare Annual Wellness (AWV)  09/13/2023   DTaP/Tdap/Td (4 - Td or Tdap) 10/06/2025   Pneumonia Vaccine 31+ Years old  Completed   INFLUENZA VACCINE  Completed   Zoster Vaccines- Shingrix  Completed   HPV VACCINES  Aged Out   Immunization History  Administered Date(s) Administered   Fluad Quad(high Dose 65+) 06/06/2020, 04/25/2022   Influenza Split 05/07/2012   Influenza, High Dose Seasonal PF 05/28/2013, 05/02/2017, 05/07/2018, 05/18/2019, 05/31/2021   Influenza,inj,Quad PF,6+ Mos 04/23/2014, 05/14/2015   Influenza-Unspecified 05/09/2016   PFIZER Comirnaty(Gray Top)Covid-19 Tri-Sucrose Vaccine 04/29/2022   PFIZER(Purple Top)SARS-COV-2 Vaccination 08/21/2019, 09/11/2019, 06/02/2020   PNEUMOCOCCAL CONJUGATE-20 01/06/2022   Pneumococcal Conjugate-13 07/01/2014   Pneumococcal Polysaccharide-23 08/01/2005   Td 08/01/2001   Tdap 08/01/2005, 10/07/2015   Zoster Recombinat (Shingrix) 09/05/2021, 03/11/2022    These are the patient goals that we discussed:  Goals Addressed               This Visit's Progress     Patient Stated (pt-stated)        Patient stated that she would like to be able to maintain a healthy lifestyle.         This is a list of Health Maintenance Items that are overdue or due now: Bone densitometry screening- Due in December    Orders/Referrals Placed Today: No orders of the defined types were placed in this encounter.  (Contact our referral department at [phone] if you have not spoken with someone about your referral appointment within the  next 5 days)    Follow-up Plan Follow-up with [name], MD as planned Medicare wellness visit in one year. Patient will access AVS on my chart.      Health Maintenance, Female Adopting a healthy lifestyle and getting preventive care are important in promoting health and wellness. Ask your health care provider about: The right schedule for you to have regular tests and exams. Things you can do on your own to prevent diseases and keep yourself healthy. What should I know about diet, weight, and exercise? Eat a healthy diet  Eat a diet that includes plenty of vegetables, fruits, low-fat dairy products, and lean protein. Do not eat a lot of foods that are high in solid fats, added sugars, or sodium. Maintain a healthy weight Body mass index (BMI) is used to identify weight problems. It estimates body fat based on height and weight. Your health care provider can help determine your BMI and help you achieve or maintain a healthy weight. Get regular exercise Get regular exercise. This is one of the most important things you can do for your health. Most adults should: Exercise for at least 150 minutes each week. The exercise should increase your heart rate and make you sweat (moderate-intensity exercise). Do strengthening exercises at least twice a week. This is in addition to the moderate-intensity exercise. Spend less time sitting. Even light physical activity can be beneficial. Watch cholesterol and blood lipids Have your blood tested for lipids and cholesterol at 87 years of age, then have this test every 5 years. Have your cholesterol levels checked  more often if: Your lipid or cholesterol levels are high. You are older than 87 years of age. You are at high risk for heart disease. What should I know about cancer screening? Depending on your health history and family history, you may need to have cancer screening at various ages. This may include screening for: Breast  cancer. Cervical cancer. Colorectal cancer. Skin cancer. Lung cancer. What should I know about heart disease, diabetes, and high blood pressure? Blood pressure and heart disease High blood pressure causes heart disease and increases the risk of stroke. This is more likely to develop in people who have high blood pressure readings or are overweight. Have your blood pressure checked: Every 3-5 years if you are 75-29 years of age. Every year if you are 10 years old or older. Diabetes Have regular diabetes screenings. This checks your fasting blood sugar level. Have the screening done: Once every three years after age 42 if you are at a normal weight and have a low risk for diabetes. More often and at a younger age if you are overweight or have a high risk for diabetes. What should I know about preventing infection? Hepatitis B If you have a higher risk for hepatitis B, you should be screened for this virus. Talk with your health care provider to find out if you are at risk for hepatitis B infection. Hepatitis C Testing is recommended for: Everyone born from [birth year] through 1963. Anyone with known risk factors for hepatitis C. Sexually transmitted infections (STIs) Get screened for STIs, including gonorrhea and chlamydia, if: You are sexually active and are younger than 87 years of age. You are older than 87 years of age and your health care provider tells you that you are at risk for this type of infection. Your sexual activity has changed since you were last screened, and you are at increased risk for chlamydia or gonorrhea. Ask your health care provider if you are at risk. Ask your health care provider about whether you are at high risk for HIV. Your health care provider may recommend a prescription medicine to help prevent HIV infection. If you choose to take medicine to prevent HIV, you should first get tested for HIV. You should then be tested every 3 months for as long as you are taking  the medicine. Pregnancy If you are about to stop having your period (premenopausal) and you may become pregnant, seek counseling before you get pregnant. Take 400 to 800 micrograms (mcg) of folic acid every day if you become pregnant. Ask for birth control (contraception) if you want to prevent pregnancy. Osteoporosis and menopause Osteoporosis is a disease in which the bones lose minerals and strength with aging. This can result in bone fractures. If you are 37 years old or older, or if you are at risk for osteoporosis and fractures, ask your health care provider if you should: Be screened for bone loss. Take a calcium or vitamin D supplement to lower your risk of fractures. Be given hormone replacement therapy (HRT) to treat symptoms of menopause. Follow these instructions at home: Alcohol use Do not drink alcohol if: Your health care provider tells you not to drink. You are pregnant, may be pregnant, or are planning to become pregnant. If you drink alcohol: Limit how much you have to: 0-1 drink a day. Know how much alcohol is in your drink. In the [place] one drink equals one 12 oz bottle of beer (355 mL), one 5 oz glass of wine (148  mL), or one 1 oz glass of hard liquor (44 mL). Lifestyle Do not use any products that contain nicotine or tobacco. These products include cigarettes, chewing tobacco, and vaping devices, such as e-cigarettes. If you need help quitting, ask your health care provider. Do not use street drugs. Do not share needles. Ask your health care provider for help if you need support or information about quitting drugs. General instructions Schedule regular health, dental, and eye exams. Stay current with your vaccines. Tell your health care provider if: You often feel depressed. You have ever been abused or do not feel safe at home. Summary Adopting a healthy lifestyle and getting preventive care are important in promoting health and wellness. Follow your health  care provider's instructions about healthy diet, exercising, and getting tested or screened for diseases. Follow your health care provider's instructions on monitoring your cholesterol and blood pressure. This information is not intended to replace advice given to you by your health care provider. Make sure you discuss any questions you have with your health care provider. Document Revised: 12/07/2020 Document Reviewed: 12/07/2020 Elsevier Patient Education  [place].

## 2022-09-12 NOTE — Progress Notes (Signed)
MEDICARE ANNUAL WELLNESS VISIT  09/12/2022  Telephone Visit Disclaimer This Medicare AWV was conducted by telephone due to national recommendations for restrictions regarding the COVID-19 Pandemic (e.g. social distancing).  I verified, using two identifiers, that I am speaking with [name] or their authorized healthcare agent. I discussed the limitations, risks, security, and privacy concerns of performing an evaluation and management service by telephone and the potential availability of an in-person appointment in the future. The patient expressed understanding and agreed to proceed.  Location of Patient: Home Location of Provider (nurse):  In the office  Subjective:    [name] is a 89 y.o. female patient of Metheney, Rene Kocher, MD who had a Medicare Annual Wellness Visit today via telephone. [name] is Retired and lives with their spouse. she has 2 children. she reports that she is socially active and does interact with friends/family regularly. she is minimally physically active and enjoys playing the piano and cooking.  Patient Care Team: [name], MD as PCP - General (Family Medicine) Renelda Loma, [place] (Optometry) Crista Luria, MD as Attending Physician (Dermatology) Melida Quitter, MD as Attending Physician (Otolaryngology) Toribio Harbour, DC (Chiropractic Medicine) Darius Bump, [facility] as Pharmacist (Pharmacist)     09/12/2022    9:16 AM 09/08/2021   10:08 AM 07/22/2020    9:38 AM 07/22/2019    9:42 AM 07/18/2018    9:54 AM 07/17/2017    9:29 AM 07/01/2014    8:58 AM  Advanced Directives  Does Patient Have a Medical Advance Directive? Yes Yes Yes Yes Yes Yes Yes  Type of Advance Directive Living will;Healthcare Power of Attorney Living will;Healthcare Power of [place];Living will [place];Living will [place];Living will [place];Living will Living will  Does  patient want to make changes to medical advance directive? No - Patient declined No - Patient declined No - Patient declined No - Patient declined No - Patient declined    Copy of [place] in Chart? Yes - validated most recent copy scanned in chart (See row information) Yes - validated most recent copy scanned in chart (See row information) Yes - validated most recent copy scanned in chart (See row information) No - copy requested No - copy requested No - copy requested No - copy requested    Hospital Utilization Over the Past 12 Months: # of hospitalizations or ER visits: 0 # of surgeries: 0  Review of Systems    Patient reports that her overall health is better compared to last year.  History obtained from chart review and the patient  Patient Reported Readings (BP, Pulse, CBG, Weight, etc) 129/68- BP patient report   Pain Assessment Pain : No/denies pain     Current Medications & Allergies (verified) Allergies as of 09/12/2022       Reactions   Erythromycin Swelling   Penicillins Swelling, Rash, Hives        Medication List        Accurate as of September 12, 2022  9:35 AM. If you have any questions, ask your nurse or doctor.          albuterol 108 (90 Base) MCG/ACT inhaler Commonly known as: VENTOLIN HFA INHALE 2 PUFFS BY MOUTH EVERY 6 HOURS IF NEEDED.   amLODipine 5 MG tablet Commonly known as: NORVASC TAKE 1 TABLET BY MOUTH EVERY DAY   Anoro Ellipta 62.5-25 MCG/ACT Aepb Generic drug: umeclidinium-vilanterol  INHALE 1 PUFF BY MOUTH EVERY DAY   ascorbic acid 500 MG tablet Commonly known as: VITAMIN C Take 500 mg by mouth daily.   atorvastatin 10 MG tablet Commonly known as: LIPITOR Take 1 tablet (10 mg total) by mouth at bedtime.   azelastine 0.05 % ophthalmic solution Commonly known as: OPTIVAR Apply 1 drop to eye 4 (four) times daily. Uses it as needed.   CALCIUM CITRATE + D PO Take 1 tablet by mouth 2 (two) times daily.    losartan 100 MG tablet Commonly known as: COZAAR TAKE 1 TABLET BY MOUTH EVERY DAY   Magnesium 200 MG Tabs Take 3 tablets by mouth daily. 2 capsules in the morning and 1 capsule in the afternoon   PreserVision AREDS 2 Caps Take 1 tablet by mouth 2 (two) times daily.   VITAMIN B COMPLEX PO Take 1 tablet by mouth daily.   Vitamin D3 25 MCG (1000 UT) Caps Take 1 capsule by mouth 3 (three) times daily.        History (reviewed): Past Medical History:  Diagnosis Date   Asthma, exercise induced    Difficulty hearing    History of parathyroid disease    HTN (hypertension)    Macular degeneration    Migraine headache without aura    OP (osteoporosis)    Problems related to lack of adequate sleep    Past Surgical History:  Procedure Laterality Date   ABDOMINAL HYSTERECTOMY     she thinks ovaries removed as well.     Anterior acromionectomy  07/31/00   left   BREAST LUMPECTOMY  1990   CATARACT EXTRACTION  [id number]   PARATHYROIDECTOMY  09/06/04   Minimally invasive parathyroid surgery by Dr. Charise Killian in [place], [place].   repair of torn rotator cuff  07/31/00   left, and 2007   TONSILLECTOMY     Family History  Problem Relation Age of Onset   Heart disease Father    Cancer Mother        tumors on parathyroid, uterine   Kidney disease Mother    Social History   Socioeconomic History   Marital status: Married    Spouse name: [name]   Number of children: 2   Years of education: 16   Highest education level: Bachelor's degree (e.g., BA, AB, BS)  Occupational History   Occupation: Games developer: RETIRED    Comment: retired  Tobacco Use   Smoking status: Never   Smokeless tobacco: Never  Vaping Use   Vaping Use: Never used  Substance and Sexual Activity   Alcohol use: No   Drug use: No   Sexual activity: Yes    Partners: Male  Other Topics Concern   Not on file  Social History Narrative   Lives with her husband. She is getting shots in her  eyes which causes her not to be able to do many things she enjoyed doing before. She uses a magnifying light as needed. She enjoys cooking and playing the piano.   Social Determinants of Health   Financial Resource Strain: Low Risk  (09/12/2022)   Overall Financial Resource Strain (CARDIA)    Difficulty of Paying Living Expenses: Not hard at all  Food Insecurity: No Food Insecurity (09/12/2022)   Hunger Vital Sign    Worried About Running Out of Food in the Last Year: Never true    Ran Out of Food in the Last Year: Never true  Transportation Needs: No Transportation Needs (09/12/2022)  PRAPARE - Hydrologist (Medical): No    Lack of Transportation (Non-Medical): No  Physical Activity: Inactive (09/12/2022)   Exercise Vital Sign    Days of Exercise per Week: 0 days    Minutes of Exercise per Session: 0 min  Stress: Stress Concern Present (09/12/2022)   [place]    Feeling of Stress : To some extent  Social Connections: Socially Integrated (09/12/2022)   Social Connection and Isolation Panel [NHANES]    Frequency of Communication with Friends and Family: More than three times a week    Frequency of Social Gatherings with Friends and Family: Twice a week    Attends Religious Services: More than 4 times per year    Active Member of [employer] or Organizations: Yes    Attends Archivist Meetings: More than 4 times per year    Marital Status: Married    Activities of Daily Living    09/12/2022    9:20 AM  In your present state of health, do you have any difficulty performing the following activities:  Hearing? 0  Vision? 1  Comment currently under eye treatment and takes shots in her eyes  Difficulty concentrating or making decisions? 1  Comment some difficulty with name recall.  Walking or climbing stairs? 0  Dressing or bathing? 0  Doing errands, shopping? 1  Comment does not drive  due to her visio  Preparing Food and eating ? N  Using the Toilet? N  In the past six months, have you accidently leaked urine? Y  Comment some  Do you have problems with loss of bowel control? N  Managing your Medications? N  Managing your Finances? N  Housekeeping or managing your Housekeeping? N    Patient Education/ Literacy How often do you need to have someone help you when you read instructions, pamphlets, or other written materials from your doctor or pharmacy?: 1 - Never What is the last grade level you completed in school?: bachelor's degree  Exercise Current Exercise Habits: The patient does not participate in regular exercise at present, Exercise limited by: Other - see comments (vision problems)  Diet Patient reports consuming 3 meals a day and 1 snack(s) a day Patient reports that her primary diet is: Regular Patient reports that she does have regular access to food.   Depression Screen    09/12/2022    9:16 AM 09/08/2021   10:13 AM 07/08/2021   11:02 AM 07/22/2020    9:53 AM 07/07/2020    9:21 AM 07/22/2019    9:43 AM 07/08/2019   10:44 AM  PHQ 2/9 Scores  PHQ - 2 Score 1 0 0 1 1 1 $ 0     Fall Risk    09/12/2022    9:20 AM 01/06/2022    8:59 AM 09/08/2021   10:12 AM 07/08/2021   11:03 AM 01/05/2021    9:46 AM  Fall Risk   Falls in the past year? 0 0 0 0 0  Number falls in past yr: 0 0 0 0 0  Injury with Fall? 0 0 0 0 0  Risk for fall due to : No Fall Risks No Fall Risks No Fall Risks  No Fall Risks  Follow up Falls evaluation completed Falls prevention discussed;Falls evaluation completed Falls evaluation completed Falls prevention discussed;Falls evaluation completed Falls prevention discussed;Falls evaluation completed     Objective:  [name] seemed alert and oriented and  she participated appropriately during our telephone visit.  Blood Pressure Weight BMI  BP Readings from Last 3 Encounters:  09/12/22 129/68  08/23/22 (!) 171/83  04/21/22 (!)  144/65   Wt Readings from Last 3 Encounters:  08/23/22 127 lb (57.6 kg)  04/21/22 128 lb (58.1 kg)  01/06/22 127 lb (57.6 kg)   BMI Readings from Last 1 Encounters:  08/23/22 22.50 kg/m    *Unable to obtain current vital signs, weight, and BMI due to telephone visit type  Hearing/Vision  Pamelia did not seem to have difficulty with hearing/understanding during the telephone conversation Reports that she has had a formal eye exam by an eye care professional within the past year Reports that she has not had a formal hearing evaluation within the past year *Unable to fully assess hearing and vision during telephone visit type  Cognitive Function:    09/12/2022    9:28 AM 09/08/2021   10:22 AM 07/22/2020    9:44 AM 07/22/2019    9:51 AM 07/18/2018   10:06 AM  6CIT Screen  What Year? 0 points 0 points 0 points 0 points 0 points  What month? 0 points 0 points 0 points 0 points 0 points  What time? 0 points 0 points 0 points 0 points 0 points  Count back from 20 0 points 0 points 0 points 0 points 0 points  Months in reverse 2 points 0 points 0 points 0 points 2 points  Repeat phrase 4 points 0 points 0 points 2 points 2 points  Total Score 6 points 0 points 0 points 2 points 4 points   (Normal:0-7, Significant for Dysfunction: >8)  Normal Cognitive Function Screening: Yes   Immunization & Health Maintenance Record Immunization History  Administered Date(s) Administered   Fluad Quad(high Dose 65+) 06/06/2020, 04/25/2022   Influenza Split 05/07/2012   Influenza, High Dose Seasonal PF 05/28/2013, 05/02/2017, 05/07/2018, 05/18/2019, 05/31/2021   Influenza,inj,Quad PF,6+ Mos 04/23/2014, 05/14/2015   Influenza-Unspecified 05/09/2016   PFIZER Comirnaty(Gray Top)Covid-19 Tri-Sucrose Vaccine 04/29/2022   PFIZER(Purple Top)SARS-COV-2 Vaccination 08/21/2019, 09/11/2019, 06/02/2020   PNEUMOCOCCAL CONJUGATE-20 01/06/2022   Pneumococcal Conjugate-13 07/01/2014   Pneumococcal Polysaccharide-23  08/01/2005   Td 08/01/2001   Tdap 08/01/2005, 10/07/2015   Zoster Recombinat (Shingrix) 09/05/2021, 03/11/2022    Health Maintenance  Topic Date Due   COVID-19 Vaccine (5 - 2023-24 season) 09/28/2022 (Originally 06/24/2022)   DEXA SCAN  07/22/2023   Medicare Annual Wellness (AWV)  09/13/2023   DTaP/Tdap/Td (4 - Td or Tdap) 10/06/2025   Pneumonia Vaccine 43+ Years old  Completed   INFLUENZA VACCINE  Completed   Zoster Vaccines- Shingrix  Completed   HPV VACCINES  Aged Out       Assessment  This is a routine wellness examination for [name].  Health Maintenance: Due or Overdue There are no preventive care reminders to display for this patient.   [name] does not need a referral for Community Assistance: Care Management:   no Social Work:    no Prescription Assistance:  no Nutrition/Diabetes Education:  no   Plan:  Personalized Goals  Goals Addressed               This Visit's Progress     Patient Stated (pt-stated)        Patient stated that she would like to be able to maintain a healthy lifestyle.       Personalized Health Maintenance & Screening Recommendations  Bone densitometry screening- Due in December  Lung  Cancer Screening Recommended: no (Low Dose CT Chest recommended if Age 38-80 years, 30 pack-year currently smoking OR have quit w/in past 15 years) Hepatitis C Screening recommended: no HIV Screening recommended: no  Advanced Directives: Written information was not prepared per patient's request.  Referrals & Orders No orders of the defined types were placed in this encounter.   Follow-up Plan Follow-up with [name], MD as planned Medicare wellness visit in one year. Patient will access AVS on my chart.   I have personally reviewed and noted the following in the patient's chart:   Medical and social history Use of alcohol, tobacco or illicit drugs  Current medications and supplements Functional ability and  status Nutritional status Physical activity Advanced directives List of other physicians Hospitalizations, surgeries, and ER visits in previous 12 months Vitals Screenings to include cognitive, depression, and falls Referrals and appointments  In addition, I have reviewed and discussed with [name] certain preventive protocols, quality metrics, and best practice recommendations. A written personalized care plan for preventive services as well as general preventive health recommendations is available and can be mailed to the patient at her request.      Tinnie Gens, RN BSN  09/12/2022

## 2022-09-21 DIAGNOSIS — H43813 Vitreous degeneration, bilateral: Secondary | ICD-10-CM | POA: Diagnosis not present

## 2022-09-21 DIAGNOSIS — D3132 Benign neoplasm of left choroid: Secondary | ICD-10-CM | POA: Diagnosis not present

## 2022-09-21 DIAGNOSIS — H353231 Exudative age-related macular degeneration, bilateral, with active choroidal neovascularization: Secondary | ICD-10-CM | POA: Diagnosis not present

## 2022-10-08 ENCOUNTER — Other Ambulatory Visit: Payer: Self-pay | Admitting: Family Medicine

## 2022-10-08 DIAGNOSIS — I10 Essential (primary) hypertension: Secondary | ICD-10-CM

## 2022-10-20 ENCOUNTER — Ambulatory Visit (INDEPENDENT_AMBULATORY_CARE_PROVIDER_SITE_OTHER): Payer: Medicare HMO | Admitting: Family Medicine

## 2022-10-20 ENCOUNTER — Encounter: Payer: Self-pay | Admitting: Family Medicine

## 2022-10-20 VITALS — BP 138/76 | HR 74 | Ht 63.0 in | Wt 127.0 lb

## 2022-10-20 DIAGNOSIS — I10 Essential (primary) hypertension: Secondary | ICD-10-CM | POA: Diagnosis not present

## 2022-10-20 DIAGNOSIS — I700 Atherosclerosis of aorta: Secondary | ICD-10-CM | POA: Diagnosis not present

## 2022-10-20 DIAGNOSIS — J449 Chronic obstructive pulmonary disease, unspecified: Secondary | ICD-10-CM | POA: Diagnosis not present

## 2022-10-20 DIAGNOSIS — H353230 Exudative age-related macular degeneration, bilateral, stage unspecified: Secondary | ICD-10-CM

## 2022-10-20 NOTE — Patient Instructions (Addendum)
You have had the new Pneumonia vaccine.  You are up to date.   Check with your insurance to see if there may be a better covered inhaler instead of Anoro.  Happy to change the prescription if needed.

## 2022-10-20 NOTE — Progress Notes (Signed)
   Established Patient Office Visit  Subjective   Patient ID: [name], female    DOB: July 07, 1933  Age: 89 y.o. MRN: [id number]  Chief Complaint  Patient presents with   Hypertension    HPI Hypertension- Pt denies chest pain, SOB, dizziness, or heart palpitations.  Taking meds as directed w/o problems.  Denies medication side effects.  Brought in home log of blood pressures.  Overall they look great except she did have 1 high 1 at 187/86 on March 17.  Otherwise they typically run in the 120s and 130s.  Aortic atherosclerosis- no chest pain.   Reports that overall she is sleeping much better.  She came off of the melatonin and switch to a different brand of magnesium called Macrotec and feels like that is been helping.  COPD-she takes her Anoro daily but it is quite expensive and last year she ended up in her Medicare gap and it was over $100 for 3 months.  She uses her rescue inhaler once or twice a month and says she usually gets quick relief.  Most of the time she gets a flare with increased activity or walking.  Still getting regular eye injections about every 10 weeks.    ROS    Objective:     BP 138/76   Pulse 74   Ht 5\' 3"  (1.6 m)   Wt 127 lb (57.6 kg)   SpO2 98%   BMI 22.50 kg/m    Physical Exam Vitals and nursing note reviewed.  Constitutional:      Appearance: She is well-developed.  HENT:     Head: Normocephalic and atraumatic.  Cardiovascular:     Rate and Rhythm: Normal rate and regular rhythm.     Heart sounds: Normal heart sounds.  Pulmonary:     Effort: Pulmonary effort is normal.     Breath sounds: Normal breath sounds.  Skin:    General: Skin is warm and dry.  Neurological:     Mental Status: She is alert and oriented to person, place, and time.  Psychiatric:        Behavior: Behavior normal.      No results found for any visits on 10/20/22.    The ASCVD Risk score (Arnett DK, et al., 2019) failed to calculate for the following  reasons:   The 2019 ASCVD risk score is only valid for ages 69 to 71    Assessment & Plan:   Problem List Items Addressed This Visit       Cardiovascular and Mediastinum   Essential hypertension - Primary   Relevant Orders   COMPLETE METABOLIC PANEL WITH GFR   Aortic atherosclerosis (HCC)    Stable, continue daily statin.       Relevant Orders   COMPLETE METABOLIC PANEL WITH GFR     Respiratory   COPD (chronic obstructive pulmonary disease) ([place])    Well controlled on Anoro, she would like to consider reducing her medication. Discussed option of antimuscarinic daily.  Continue PRN albuterol.    Check with your insurance to see if there may be a better covered inhaler instead of Anoro.  Happy to change the prescription if needed.        Other   Macular degeneration    Continues with montly injections        Return in about 4 months (around 02/19/2023) for BP and Memory evaluation. Beatrice Lecher, MD

## 2022-10-20 NOTE — Assessment & Plan Note (Signed)
Continues with montly injections

## 2022-10-20 NOTE — Assessment & Plan Note (Signed)
Stable, continue daily statin.

## 2022-10-20 NOTE — Assessment & Plan Note (Signed)
Well controlled on Anoro, she would like to consider reducing her medication. Discussed option of antimuscarinic daily.  Continue PRN albuterol.    Check with your insurance to see if there may be a better covered inhaler instead of Anoro.  Happy to change the prescription if needed.

## 2022-10-21 LAB — COMPLETE METABOLIC PANEL WITH GFR
AG Ratio: 2.2 (calc) (ref 1.0–2.5)
ALT: 22 U/L (ref 6–29)
AST: 21 U/L (ref 10–35)
Albumin: 4.4 g/dL (ref 3.6–5.1)
Alkaline phosphatase (APISO): 106 U/L (ref 37–153)
BUN: 23 mg/dL (ref 7–25)
CO2: 27 mmol/L (ref 20–32)
Calcium: 10.1 mg/dL (ref 8.6–10.4)
Chloride: 106 mmol/L (ref 98–110)
Creat: 0.76 mg/dL (ref 0.60–0.95)
Globulin: 2 g/dL (calc) (ref 1.9–3.7)
Glucose, Bld: 102 mg/dL — ABNORMAL HIGH (ref 65–99)
Potassium: 4.2 mmol/L (ref 3.5–5.3)
Sodium: 142 mmol/L (ref 135–146)
Total Bilirubin: 0.6 mg/dL (ref 0.2–1.2)
Total Protein: 6.4 g/dL (ref 6.1–8.1)
eGFR: 76 mL/min/{1.73_m2} (ref >=60)

## 2022-10-21 NOTE — Progress Notes (Signed)
Your lab work is within acceptable range and there are no concerning findings.   ?

## 2022-11-23 DIAGNOSIS — H353231 Exudative age-related macular degeneration, bilateral, with active choroidal neovascularization: Secondary | ICD-10-CM | POA: Diagnosis not present

## 2022-11-23 DIAGNOSIS — H43813 Vitreous degeneration, bilateral: Secondary | ICD-10-CM | POA: Diagnosis not present

## 2022-11-23 DIAGNOSIS — D3132 Benign neoplasm of left choroid: Secondary | ICD-10-CM | POA: Diagnosis not present

## 2022-12-19 DIAGNOSIS — H524 Presbyopia: Secondary | ICD-10-CM | POA: Diagnosis not present

## 2023-01-03 ENCOUNTER — Other Ambulatory Visit: Payer: Self-pay | Admitting: Family Medicine

## 2023-01-03 DIAGNOSIS — I10 Essential (primary) hypertension: Secondary | ICD-10-CM

## 2023-01-06 DIAGNOSIS — Z86018 Personal history of other benign neoplasm: Secondary | ICD-10-CM | POA: Diagnosis not present

## 2023-01-06 DIAGNOSIS — D2261 Melanocytic nevi of right upper limb, including shoulder: Secondary | ICD-10-CM | POA: Diagnosis not present

## 2023-01-06 DIAGNOSIS — R238 Other skin changes: Secondary | ICD-10-CM | POA: Diagnosis not present

## 2023-01-06 DIAGNOSIS — L578 Other skin changes due to chronic exposure to nonionizing radiation: Secondary | ICD-10-CM | POA: Diagnosis not present

## 2023-01-06 DIAGNOSIS — D2272 Melanocytic nevi of left lower limb, including hip: Secondary | ICD-10-CM | POA: Diagnosis not present

## 2023-01-06 DIAGNOSIS — L570 Actinic keratosis: Secondary | ICD-10-CM | POA: Diagnosis not present

## 2023-01-06 DIAGNOSIS — Z85828 Personal history of other malignant neoplasm of skin: Secondary | ICD-10-CM | POA: Diagnosis not present

## 2023-01-06 DIAGNOSIS — L821 Other seborrheic keratosis: Secondary | ICD-10-CM | POA: Diagnosis not present

## 2023-01-06 DIAGNOSIS — D225 Melanocytic nevi of trunk: Secondary | ICD-10-CM | POA: Diagnosis not present

## 2023-01-06 DIAGNOSIS — L720 Epidermal cyst: Secondary | ICD-10-CM | POA: Diagnosis not present

## 2023-01-25 DIAGNOSIS — H353231 Exudative age-related macular degeneration, bilateral, with active choroidal neovascularization: Secondary | ICD-10-CM | POA: Diagnosis not present

## 2023-01-25 DIAGNOSIS — D3132 Benign neoplasm of left choroid: Secondary | ICD-10-CM | POA: Diagnosis not present

## 2023-01-25 DIAGNOSIS — H43813 Vitreous degeneration, bilateral: Secondary | ICD-10-CM | POA: Diagnosis not present

## 2023-01-30 ENCOUNTER — Other Ambulatory Visit: Payer: Self-pay | Admitting: Family Medicine

## 2023-01-30 DIAGNOSIS — J449 Chronic obstructive pulmonary disease, unspecified: Secondary | ICD-10-CM

## 2023-01-31 ENCOUNTER — Other Ambulatory Visit: Payer: Self-pay | Admitting: Family Medicine

## 2023-01-31 DIAGNOSIS — I10 Essential (primary) hypertension: Secondary | ICD-10-CM

## 2023-02-20 ENCOUNTER — Other Ambulatory Visit: Payer: Self-pay | Admitting: Family Medicine

## 2023-02-20 ENCOUNTER — Encounter: Payer: Self-pay | Admitting: Family Medicine

## 2023-02-20 ENCOUNTER — Ambulatory Visit (INDEPENDENT_AMBULATORY_CARE_PROVIDER_SITE_OTHER): Payer: Medicare HMO | Admitting: Family Medicine

## 2023-02-20 VITALS — BP 139/65 | HR 64 | Ht 63.0 in | Wt 125.0 lb

## 2023-02-20 DIAGNOSIS — I700 Atherosclerosis of aorta: Secondary | ICD-10-CM | POA: Diagnosis not present

## 2023-02-20 DIAGNOSIS — D508 Other iron deficiency anemias: Secondary | ICD-10-CM | POA: Diagnosis not present

## 2023-02-20 DIAGNOSIS — J449 Chronic obstructive pulmonary disease, unspecified: Secondary | ICD-10-CM | POA: Diagnosis not present

## 2023-02-20 DIAGNOSIS — I10 Essential (primary) hypertension: Secondary | ICD-10-CM

## 2023-02-20 DIAGNOSIS — R413 Other amnesia: Secondary | ICD-10-CM | POA: Diagnosis not present

## 2023-02-20 MED ORDER — UMECLIDINIUM BROMIDE 62.5 MCG/ACT IN AEPB: 1.0000 | INHALATION_SPRAY | Freq: Every day | RESPIRATORY_TRACT | 1 refills | Status: DC

## 2023-02-20 MED ORDER — BEVESPI AEROSPHERE 9-4.8 MCG/ACT IN AERO
2.0000 | INHALATION_SPRAY | Freq: Every day | RESPIRATORY_TRACT | 11 refills | Status: DC
Start: 2023-02-20 — End: 2023-02-23

## 2023-02-20 NOTE — Assessment & Plan Note (Signed)
Blood pressures at goal.  Continue to follow.  She does have whitecoat hypertension as well.

## 2023-02-20 NOTE — Patient Instructions (Signed)
Let us know if the new inhaler is not cheaper than the current 1.

## 2023-02-20 NOTE — Assessment & Plan Note (Signed)
Continue daily Lipitor.

## 2023-02-20 NOTE — Assessment & Plan Note (Signed)
We performed a little bit better on today's MoCA than her prior last year.  Do not have a great explanation for the change.  She does seem to be managing her mood a little better which certainly could be contributing.  Will continue to monitor yearly.

## 2023-02-20 NOTE — Progress Notes (Addendum)
Established Patient Office Visit  Subjective   Patient ID: [name], female    DOB: 10/28/1932  Age: 89 y.o. MRN: [id number]  Chief Complaint  Patient presents with   Memory Loss   Hypertension    HPI  F/U memory impairment -she is here today for follow-up memory testing.  Currently on any medication but we have been monitoring her.  Her last MoCA score in 2023 was 21 out of 30.  Hypertension- Pt denies chest pain, SOB, dizziness, or heart palpitations.  Taking meds as directed w/o problems.  Denies medication side effects. Brought in home log.  Pressures look fantastic just a couple in the 150s but most of them are all well-controlled.  She does have whitecoat hypertension.  Still dealing with some increased anxiety.  She says right now she really does not have any major stressors so she just tries to take it in stride when she starts to feel anxious about things like traveling to the mountains etc.  She has good support in her husband.   So has some nights where she does not sleep well and she will just get up and try to walk around a little bit until she feels more tired.  She is not taking any over-the-counter's for sleep most nights she does okay.  Usually its nights where she is worried or stressed about something.  ROS    Objective:     BP 139/65   Pulse 64   Ht 5\' 3"  (1.6 m)   Wt 125 lb (56.7 kg)   SpO2 98%   BMI 22.14 kg/m    Physical Exam Constitutional:      Appearance: She is well-developed.  HENT:     Head: Normocephalic and atraumatic.  Cardiovascular:     Rate and Rhythm: Normal rate and regular rhythm.     Heart sounds: Normal heart sounds.  Pulmonary:     Effort: Pulmonary effort is normal.     Breath sounds: Normal breath sounds.  Skin:    General: Skin is warm and dry.  Neurological:     Mental Status: She is alert and oriented to person, place, and time.  Psychiatric:        Behavior: Behavior normal.      No results found for any  visits on 02/20/23.    The ASCVD Risk score (Arnett DK, et al., 2019) failed to calculate for the following reasons:   The 2019 ASCVD risk score is only valid for ages 44 to 23    Assessment & Plan:   Problem List Items Addressed This Visit       Cardiovascular and Mediastinum   Essential hypertension - Primary    Blood pressures at goal.  Continue to follow.  She does have whitecoat hypertension as well.      Relevant Orders   CMP14+EGFR   Lipid panel   CBC   Aortic atherosclerosis (HCC)    Continue daily Lipitor.      Relevant Orders   CMP14+EGFR   Lipid panel   CBC     Respiratory   COPD (chronic obstructive pulmonary disease) (HCC)    The prescription for Bevespi since the Anoro is costing her over $100 a month.  If it is not any cheaper then please let us know.      Relevant Medications   Glycopyrrolate-Formoterol (BEVESPI AEROSPHERE) 9-4.8 MCG/ACT AERO   Other Relevant Orders   CMP14+EGFR   CBC     Other  Memory impairment    We performed a little bit better on today's MoCA than her prior last year.  Do not have a great explanation for the change.  She does seem to be managing her mood a little better which certainly could be contributing.  Will continue to monitor yearly.      Relevant Orders   CMP14+EGFR   Lipid panel   CBC    Return in about 6 months (around 08/23/2023) for Hypertension.     I spent 40 minutes on the day of the encounter to include pre-visit record review, face-to-face time with the patient and post visit ordering of test.  Nani Gasser, MD

## 2023-02-20 NOTE — Assessment & Plan Note (Signed)
The prescription for Bevespi since the Anoro is costing her over $100 a month.  If it is not any cheaper then please let us know.

## 2023-02-21 ENCOUNTER — Other Ambulatory Visit: Payer: Self-pay | Admitting: Family Medicine

## 2023-02-21 DIAGNOSIS — R413 Other amnesia: Secondary | ICD-10-CM | POA: Diagnosis not present

## 2023-02-21 DIAGNOSIS — J449 Chronic obstructive pulmonary disease, unspecified: Secondary | ICD-10-CM | POA: Diagnosis not present

## 2023-02-21 DIAGNOSIS — I700 Atherosclerosis of aorta: Secondary | ICD-10-CM | POA: Diagnosis not present

## 2023-02-21 DIAGNOSIS — I10 Essential (primary) hypertension: Secondary | ICD-10-CM | POA: Diagnosis not present

## 2023-02-21 LAB — CMP14+EGFR
ALT: 18 IU/L (ref 0–32)
AST: 20 IU/L (ref 0–40)
Albumin: 4.2 g/dL (ref 3.7–4.7)
Alkaline Phosphatase: 129 IU/L — ABNORMAL HIGH (ref 44–121)
BUN/Creatinine Ratio: 22 (ref 12–28)
BUN: 17 mg/dL (ref 8–27)
Bilirubin Total: 0.5 mg/dL (ref 0.0–1.2)
CO2: 24 mmol/L (ref 20–29)
Calcium: 10 mg/dL (ref 8.7–10.3)
Chloride: 108 mmol/L — ABNORMAL HIGH (ref 96–106)
Creatinine, Ser: 0.77 mg/dL (ref 0.57–1.00)
Globulin, Total: 1.9 g/dL (ref 1.5–4.5)
Glucose: 92 mg/dL (ref 70–99)
Potassium: 4.6 mmol/L (ref 3.5–5.2)
Sodium: 145 mmol/L — ABNORMAL HIGH (ref 134–144)
Total Protein: 6.1 g/dL (ref 6.0–8.5)
eGFR: 74 mL/min/{1.73_m2} (ref >59)

## 2023-02-21 LAB — CBC
Hematocrit: 47.1 % — ABNORMAL HIGH (ref 34.0–46.6)
Hemoglobin: 14.1 g/dL (ref 11.1–15.9)
MCH: 29.6 pg (ref 26.6–33.0)
MCHC: 29.9 g/dL — ABNORMAL LOW (ref 31.5–35.7)
MCV: 99 fL — ABNORMAL HIGH (ref 79–97)
Platelets: 202 10*3/uL (ref 150–450)
RBC: 4.77 x10E6/uL (ref 3.77–5.28)
RDW: 14.5 % (ref 11.7–15.4)
WBC: 6.9 10*3/uL (ref 3.4–10.8)

## 2023-02-22 LAB — LIPID PANEL
Chol/HDL Ratio: 2.3 ratio (ref 0.0–4.4)
Cholesterol, Total: 153 mg/dL (ref 100–199)
HDL: 68 mg/dL (ref >39)
LDL Chol Calc (NIH): 69 mg/dL (ref 0–99)
Triglycerides: 86 mg/dL (ref 0–149)
VLDL Cholesterol Cal: 16 mg/dL (ref 5–40)

## 2023-02-22 NOTE — Progress Notes (Signed)
Cholesterol looks great

## 2023-02-23 ENCOUNTER — Telehealth: Payer: Self-pay | Admitting: Family Medicine

## 2023-02-23 ENCOUNTER — Other Ambulatory Visit: Payer: Self-pay | Admitting: *Deleted

## 2023-02-23 DIAGNOSIS — E870 Hyperosmolality and hypernatremia: Secondary | ICD-10-CM

## 2023-02-23 DIAGNOSIS — R899 Unspecified abnormal finding in specimens from other organs, systems and tissues: Secondary | ICD-10-CM

## 2023-02-23 DIAGNOSIS — J449 Chronic obstructive pulmonary disease, unspecified: Secondary | ICD-10-CM

## 2023-02-23 MED ORDER — ANORO ELLIPTA 62.5-25 MCG/ACT IN AEPB
1.0000 | INHALATION_SPRAY | Freq: Every day | RESPIRATORY_TRACT | 5 refills | Status: DC
Start: 2023-02-23 — End: 2023-08-28

## 2023-02-23 NOTE — Progress Notes (Signed)
Hi [name], sodium was a little borderline elevated which is a little unusual for you.  So do want a recheck that in a couple of weeks.  If increase the sodium in your diet then just drop back down around 2000 mg/day. Phosphatase was also just slightly elevated so would like to recheck that at that time as well.  No anemia.  Call the lab and see if we can add a B12 level.  Okay to use macrocytic anemia

## 2023-02-23 NOTE — Telephone Encounter (Signed)
Patient seen on Monday she is asking to stay on her old inhaler anoro Ellipta she would like to would like a call back to clarify  [phone]

## 2023-02-23 NOTE — Telephone Encounter (Signed)
Sent in the prescription for Anoro pt advised.

## 2023-02-28 NOTE — Progress Notes (Signed)
HI [name], vitamin B12 looks great.

## 2023-03-08 DIAGNOSIS — E870 Hyperosmolality and hypernatremia: Secondary | ICD-10-CM | POA: Diagnosis not present

## 2023-03-08 DIAGNOSIS — R899 Unspecified abnormal finding in specimens from other organs, systems and tissues: Secondary | ICD-10-CM | POA: Diagnosis not present

## 2023-03-09 NOTE — Progress Notes (Signed)
Your lab work is within acceptable range and there are no concerning findings.   ?

## 2023-03-14 DIAGNOSIS — H903 Sensorineural hearing loss, bilateral: Secondary | ICD-10-CM | POA: Diagnosis not present

## 2023-03-14 DIAGNOSIS — H6121 Impacted cerumen, right ear: Secondary | ICD-10-CM | POA: Diagnosis not present

## 2023-03-29 DIAGNOSIS — H353221 Exudative age-related macular degeneration, left eye, with active choroidal neovascularization: Secondary | ICD-10-CM | POA: Diagnosis not present

## 2023-03-29 DIAGNOSIS — D3132 Benign neoplasm of left choroid: Secondary | ICD-10-CM | POA: Diagnosis not present

## 2023-03-29 DIAGNOSIS — H353211 Exudative age-related macular degeneration, right eye, with active choroidal neovascularization: Secondary | ICD-10-CM | POA: Diagnosis not present

## 2023-03-29 DIAGNOSIS — H353231 Exudative age-related macular degeneration, bilateral, with active choroidal neovascularization: Secondary | ICD-10-CM | POA: Diagnosis not present

## 2023-03-29 DIAGNOSIS — H43813 Vitreous degeneration, bilateral: Secondary | ICD-10-CM | POA: Diagnosis not present

## 2023-03-31 ENCOUNTER — Other Ambulatory Visit: Payer: Self-pay | Admitting: Family Medicine

## 2023-03-31 DIAGNOSIS — I10 Essential (primary) hypertension: Secondary | ICD-10-CM

## 2023-04-12 ENCOUNTER — Other Ambulatory Visit: Payer: Self-pay | Admitting: Family Medicine

## 2023-04-12 DIAGNOSIS — Z780 Asymptomatic menopausal state: Secondary | ICD-10-CM

## 2023-04-12 NOTE — Progress Notes (Signed)
Orders Placed This Encounter  Procedures   DG Bone Density    Standing Status:   Future    Standing Expiration Date:   04/11/2024    Scheduling Instructions:     Schedule in December    Order Specific Question:   Reason for Exam (SYMPTOM  OR DIAGNOSIS REQUIRED)    Answer:   osteoporosis    Order Specific Question:   Preferred imaging location?    Answer:   [facility]

## 2023-04-28 ENCOUNTER — Other Ambulatory Visit: Payer: Self-pay | Admitting: Family Medicine

## 2023-04-28 DIAGNOSIS — I700 Atherosclerosis of aorta: Secondary | ICD-10-CM

## 2023-05-31 DIAGNOSIS — D3132 Benign neoplasm of left choroid: Secondary | ICD-10-CM | POA: Diagnosis not present

## 2023-05-31 DIAGNOSIS — H353231 Exudative age-related macular degeneration, bilateral, with active choroidal neovascularization: Secondary | ICD-10-CM | POA: Diagnosis not present

## 2023-05-31 DIAGNOSIS — H43813 Vitreous degeneration, bilateral: Secondary | ICD-10-CM | POA: Diagnosis not present

## 2023-06-02 ENCOUNTER — Other Ambulatory Visit: Payer: Self-pay | Admitting: Family Medicine

## 2023-06-02 DIAGNOSIS — J449 Chronic obstructive pulmonary disease, unspecified: Secondary | ICD-10-CM

## 2023-06-02 DIAGNOSIS — J45990 Exercise induced bronchospasm: Secondary | ICD-10-CM

## 2023-06-23 ENCOUNTER — Other Ambulatory Visit: Payer: Self-pay | Admitting: Family Medicine

## 2023-06-23 DIAGNOSIS — I10 Essential (primary) hypertension: Secondary | ICD-10-CM

## 2023-07-28 ENCOUNTER — Other Ambulatory Visit: Payer: Self-pay | Admitting: Family Medicine

## 2023-07-28 DIAGNOSIS — I10 Essential (primary) hypertension: Secondary | ICD-10-CM

## 2023-08-04 DIAGNOSIS — D3132 Benign neoplasm of left choroid: Secondary | ICD-10-CM | POA: Diagnosis not present

## 2023-08-04 DIAGNOSIS — H43813 Vitreous degeneration, bilateral: Secondary | ICD-10-CM | POA: Diagnosis not present

## 2023-08-04 DIAGNOSIS — H353231 Exudative age-related macular degeneration, bilateral, with active choroidal neovascularization: Secondary | ICD-10-CM | POA: Diagnosis not present

## 2023-08-09 ENCOUNTER — Ambulatory Visit: Payer: Medicare HMO

## 2023-08-09 DIAGNOSIS — Z780 Asymptomatic menopausal state: Secondary | ICD-10-CM

## 2023-08-09 NOTE — Progress Notes (Signed)
 [name], bone density shows a T-score of -2.6 which is consistent with thin bones or osteoporosis.   The current recommendation for osteoporosis treatment includes:   #1 calcium -total of 1200 mg of calcium  daily.  If you eat a very calcium  rich diet you may be able to obtain that without a supplement.  If not, then I recommend calcium  500 mg twice a day.  There are several products over-the-counter such as Caltrate D and Viactiv chews which are great options that contain calcium  and vitamin D . #2 vitamin D -recommend 800 international units daily. #3 exercise-recommend 30 minutes of weightbearing exercise 3 days a week.  Resistance training ,such as doing bands and light weights, can be particularly helpful. #4 medication-if you are not currently on a bone builder, also called a bisphosphonate, then this has been shown to be very helpful in maintaining bone strength, preventing further thinning of the bones, and reducing your risk for fractures.  I would highly recommend that you consider starting 1 of these medications.  If you are okay with that then please let us  know and we will send one to your pharmacy.  If you would like to discuss further we are happy to make an appointment for you so that we can go over options for treatment.

## 2023-08-11 NOTE — Progress Notes (Signed)
 No just continue to stick with 1 tab twice a day.  We want enough calcium  to improve bone strength but we do not want too much where it could increase risk for things like kidney stones.  Also sorry for the typo on the dictation software it said Diane and it was supposed to say Hi Jenkins

## 2023-08-28 ENCOUNTER — Encounter: Payer: Self-pay | Admitting: Family Medicine

## 2023-08-28 ENCOUNTER — Ambulatory Visit (INDEPENDENT_AMBULATORY_CARE_PROVIDER_SITE_OTHER): Payer: Medicare HMO | Admitting: Family Medicine

## 2023-08-28 VITALS — BP 128/72 | HR 77 | Ht 63.0 in | Wt 125.0 lb

## 2023-08-28 DIAGNOSIS — I10 Essential (primary) hypertension: Secondary | ICD-10-CM

## 2023-08-28 DIAGNOSIS — M545 Low back pain, unspecified: Secondary | ICD-10-CM | POA: Diagnosis not present

## 2023-08-28 DIAGNOSIS — J449 Chronic obstructive pulmonary disease, unspecified: Secondary | ICD-10-CM | POA: Diagnosis not present

## 2023-08-28 DIAGNOSIS — I700 Atherosclerosis of aorta: Secondary | ICD-10-CM

## 2023-08-28 DIAGNOSIS — G8929 Other chronic pain: Secondary | ICD-10-CM

## 2023-08-28 MED ORDER — ANORO ELLIPTA 62.5-25 MCG/ACT IN AEPB: 1.0000 | INHALATION_SPRAY | Freq: Every day | RESPIRATORY_TRACT | 5 refills | Status: DC

## 2023-08-28 MED ORDER — LOSARTAN POTASSIUM 100 MG PO TABS: 100.0000 mg | ORAL_TABLET | Freq: Every day | ORAL | 3 refills | Status: AC

## 2023-08-28 NOTE — Assessment & Plan Note (Signed)
Manual blood pressure looks absolutely phenomenal today.  Continue current regimen.  Will get updated labs.  Follow-up in 6 months.

## 2023-08-28 NOTE — Assessment & Plan Note (Signed)
Give her a handout with some exercises for her lumbar spine.  She says she has been doing some for her neck but has not been sure how helpful they really are.  But we discussed that it could be helpful so I would encourage her to at least try doing them if she is not making improvement with the stretches and chiropractor then I am happy to get her in for further workup.

## 2023-08-28 NOTE — Progress Notes (Signed)
Established Patient Office Visit  Subjective  Patient ID: [name], female    DOB: 02-Nov-1932  Age: 89 y.o. MRN: [id number]  Chief Complaint  Patient presents with   Hypertension    HPI  F/U BP-[facility] for home blood pressure log though she says most of the blood pressures are on there are when she did not feel great.  Most of them are in the 130s and 140s.  A few of them she did recheck later and they came down into the 120s or 130s.  Has been struggling more lately with her low back and her hips.  She says she can sometimes go a couple years without any problems or flares and then all of a sudden it starts to bother her again.  She says it started right before Christmas.  She sometimes will take some over-the-counter pain relievers sometimes she will do powder MSM.  She is getting back in with her chiropractor which she finds helpful she sees Dr. Lucretia Field in [place] lumbar spine films in 2018.      ROS    Objective:     BP 128/72   Pulse 77   Ht 5\' 3"  (1.6 m)   Wt 125 lb (56.7 kg)   SpO2 100%   BMI 22.14 kg/m    Physical Exam Vitals and nursing note reviewed.  Constitutional:      Appearance: Normal appearance.  HENT:     Head: Normocephalic and atraumatic.  Eyes:     Conjunctiva/sclera: Conjunctivae normal.  Cardiovascular:     Rate and Rhythm: Normal rate and regular rhythm.  Pulmonary:     Effort: Pulmonary effort is normal.     Breath sounds: Normal breath sounds.  Skin:    General: Skin is warm and dry.  Neurological:     Mental Status: She is alert.  Psychiatric:        Mood and Affect: Mood normal.      No results found for any visits on 08/28/23.    The ASCVD Risk score (Arnett DK, et al., 2019) failed to calculate for the following reasons:   The 2019 ASCVD risk score is only valid for ages 37 to 54    Assessment & Plan:   Problem List Items Addressed This Visit       Cardiovascular and Mediastinum   Essential hypertension  - Primary   Manual blood pressure looks absolutely phenomenal today.  Continue current regimen.  Will get updated labs.  Follow-up in 6 months.      Relevant Medications   losartan (COZAAR) 100 MG tablet   Other Relevant Orders   CMP14+EGFR   CBC with Differential/Platelet   Aortic atherosclerosis (HCC)   Continue atorvastatin daily.      Relevant Medications   losartan (COZAAR) 100 MG tablet   Other Relevant Orders   CMP14+EGFR   CBC with Differential/Platelet     Respiratory   COPD (chronic obstructive pulmonary disease) (HCC)   Stable. No recent flares.  Continue Anoro.       Relevant Medications   umeclidinium-vilanterol (ANORO ELLIPTA) 62.5-25 MCG/ACT AEPB   Other Relevant Orders   CMP14+EGFR   CBC with Differential/Platelet     Other   Chronic low back pain   Give her a handout with some exercises for her lumbar spine.  She says she has been doing some for her neck but has not been sure how helpful they really are.  But we discussed that it could be  helpful so I would encourage her to at least try doing them if she is not making improvement with the stretches and chiropractor then I am happy to get her in for further workup.       Return in about 6 months (around 02/25/2024) for htn.    Nani Gasser, MD

## 2023-08-28 NOTE — Assessment & Plan Note (Signed)
Continue atorvastatin daily.

## 2023-08-28 NOTE — Assessment & Plan Note (Signed)
Stable. No recent flares.  Continue Anoro.

## 2023-08-29 ENCOUNTER — Encounter: Payer: Self-pay | Admitting: Family Medicine

## 2023-08-29 LAB — CMP14+EGFR
ALT: 20 [IU]/L (ref 0–32)
AST: 20 [IU]/L (ref 0–40)
Albumin: 4.4 g/dL (ref 3.7–4.7)
Alkaline Phosphatase: 130 [IU]/L — ABNORMAL HIGH (ref 44–121)
BUN/Creatinine Ratio: 22 (ref 12–28)
BUN: 18 mg/dL (ref 8–27)
Bilirubin Total: 0.5 mg/dL (ref 0.0–1.2)
CO2: 24 mmol/L (ref 20–29)
Calcium: 10.4 mg/dL — ABNORMAL HIGH (ref 8.7–10.3)
Chloride: 104 mmol/L (ref 96–106)
Creatinine, Ser: 0.82 mg/dL (ref 0.57–1.00)
Globulin, Total: 1.9 g/dL (ref 1.5–4.5)
Glucose: 97 mg/dL (ref 70–99)
Potassium: 4.3 mmol/L (ref 3.5–5.2)
Sodium: 142 mmol/L (ref 134–144)
Total Protein: 6.3 g/dL (ref 6.0–8.5)
eGFR: 69 mL/min/{1.73_m2} (ref >59)

## 2023-08-29 LAB — CBC WITH DIFFERENTIAL/PLATELET
Basophils Absolute: 0.1 10*3/uL (ref 0.0–0.2)
Basos: 1 %
EOS (ABSOLUTE): 0.2 10*3/uL (ref 0.0–0.4)
Eos: 3 %
Hematocrit: 45.6 % (ref 34.0–46.6)
Hemoglobin: 14.8 g/dL (ref 11.1–15.9)
Immature Grans (Abs): 0 10*3/uL (ref 0.0–0.1)
Immature Granulocytes: 0 %
Lymphocytes Absolute: 1.4 10*3/uL (ref 0.7–3.1)
Lymphs: 17 %
MCH: 30 pg (ref 26.6–33.0)
MCHC: 32.5 g/dL (ref 31.5–35.7)
MCV: 92 fL (ref 79–97)
Monocytes Absolute: 0.7 10*3/uL (ref 0.1–0.9)
Monocytes: 9 %
Neutrophils Absolute: 5.5 10*3/uL (ref 1.4–7.0)
Neutrophils: 70 %
Platelets: 214 10*3/uL (ref 150–450)
RBC: 4.94 x10E6/uL (ref 3.77–5.28)
RDW: 12.2 % (ref 11.7–15.4)
WBC: 7.8 10*3/uL (ref 3.4–10.8)

## 2023-08-29 NOTE — Progress Notes (Signed)
Hi [name], metabolic panel overall looks okay.  Your alkaline phosphatase which is a type of liver enzyme is just slightly elevated.  It was up similarly about 6 months ago and then went back down.  It is not in a worrisome range but we will keep an eye on it.  Blood count looks great.  No sign of anemia.

## 2023-09-18 ENCOUNTER — Ambulatory Visit (INDEPENDENT_AMBULATORY_CARE_PROVIDER_SITE_OTHER): Payer: Medicare HMO

## 2023-09-18 VITALS — Ht 61.0 in | Wt 122.0 lb

## 2023-09-18 DIAGNOSIS — Z0000 Encounter for general adult medical examination without abnormal findings: Secondary | ICD-10-CM

## 2023-09-18 NOTE — Progress Notes (Addendum)
 Subjective:   [name] is a 89 y.o. female who presents for Medicare Annual (Subsequent) preventive examination.  Visit Complete: Virtual I connected with  [name] on 09/18/23 by a audio enabled telemedicine application and verified that I am speaking with the correct person using two identifiers.  Patient Location: Home  Provider Location: Office/Clinic  I discussed the limitations of evaluation and management by telemedicine. The patient expressed understanding and agreed to proceed.  Vital Signs: Because this visit was a virtual/telehealth visit, some criteria may be missing or patient reported. Any vitals not documented were not able to be obtained and vitals that have been documented are patient reported.  Patient Medicare AWV questionnaire was completed by the patient on 09/13/2023; I have confirmed that all information answered by patient is correct and no changes since this date.  Cardiac Risk Factors include: advanced age (>61men, >72 women);hypertension     Objective:    Today's Vitals   09/18/23 0948  Weight: 122 lb (55.3 kg)  Height: 5\' 1"  (1.549 m)   Body mass index is 23.05 kg/m.     09/18/2023   10:03 AM 09/12/2022    9:16 AM 09/08/2021   10:08 AM 07/22/2020    9:38 AM 07/22/2019    9:42 AM 07/18/2018    9:54 AM 07/17/2017    9:29 AM  Advanced Directives  Does Patient Have a Medical Advance Directive? Yes Yes Yes Yes Yes Yes Yes  Type of Estate agent of [place];Living will Living will;Healthcare Power of Attorney Living will;Healthcare Power of [employer] Power of [place];Living will Healthcare Power of [place];Living will Healthcare Power of [place];Living will Healthcare Power of [place];Living will  Does patient want to make changes to medical advance directive?  No - Patient declined No - Patient declined No - Patient declined No - Patient declined No - Patient declined   Copy of Healthcare Power of Attorney in  Chart? Yes - validated most recent copy scanned in chart (See row information) Yes - validated most recent copy scanned in chart (See row information) Yes - validated most recent copy scanned in chart (See row information) Yes - validated most recent copy scanned in chart (See row information) No - copy requested No - copy requested No - copy requested    Current Medications (verified) Outpatient Encounter Medications as of 09/18/2023  Medication Sig   albuterol (VENTOLIN HFA) 108 (90 Base) MCG/ACT inhaler INHALE 2 PUFFS BY MOUTH EVERY 6 HOURS IF NEEDED.   amLODipine (NORVASC) 5 MG tablet TAKE 1 TABLET BY MOUTH EVERY DAY   atorvastatin (LIPITOR) 10 MG tablet TAKE 1 TABLET BY MOUTH EVERYDAY AT BEDTIME   azelastine (OPTIVAR) 0.05 % ophthalmic solution Apply 1 drop to eye 4 (four) times daily. Uses it as needed.   B Complex Vitamins (VITAMIN B COMPLEX PO) Take 1 tablet by mouth daily.   Calcium Citrate-Vitamin D (CALCIUM CITRATE + D PO) Take 1 tablet by mouth 2 (two) times daily.   Cholecalciferol (VITAMIN D3) 1000 units CAPS Take 1 capsule by mouth 3 (three) times daily.   losartan (COZAAR) 100 MG tablet Take 1 tablet (100 mg total) by mouth daily.   Magnesium 200 MG TABS Take 3 tablets by mouth daily. 1 capsule in the morning and 2 capsules in the afternoon   Multiple Vitamins-Minerals (PRESERVISION AREDS 2) CAPS Take 1 tablet by mouth 2 (two) times daily.   umeclidinium-vilanterol (ANORO ELLIPTA) 62.5-25 MCG/ACT AEPB Inhale 1 puff into the lungs  daily.   vitamin C (ASCORBIC ACID) 500 MG tablet Take 500 mg by mouth daily.   No facility-administered encounter medications on file as of 09/18/2023.    Allergies (verified) Erythromycin and Penicillins   History: Past Medical History:  Diagnosis Date   Asthma, exercise induced    Difficulty hearing    History of parathyroid disease    HTN (hypertension)    Macular degeneration    Migraine headache without aura    OP (osteoporosis)     Problems related to lack of adequate sleep    Past Surgical History:  Procedure Laterality Date   ABDOMINAL HYSTERECTOMY     she thinks ovaries removed as well.     Anterior acromionectomy  07/31/00   left   BREAST LUMPECTOMY  1990   CATARACT EXTRACTION  1610,9604   PARATHYROIDECTOMY  09/06/04   Minimally invasive parathyroid surgery by Dr. Bonita Quin in [place], [place].   repair of torn rotator cuff  07/31/00   left, and 2007   TONSILLECTOMY     Family History  Problem Relation Age of Onset   Heart disease Father    Cancer Mother        tumors on parathyroid, uterine   Kidney disease Mother    Social History   Socioeconomic History   Marital status: Married    Spouse name: Dorinda Hill   Number of children: 2   Years of education: 16   Highest education level: Bachelor's degree (e.g., BA, AB, BS)  Occupational History   Occupation: Advertising account executive: RETIRED    Comment: retired  Tobacco Use   Smoking status: Never   Smokeless tobacco: Never  Vaping Use   Vaping status: Never Used  Substance and Sexual Activity   Alcohol use: No   Drug use: No   Sexual activity: Yes    Partners: Male  Other Topics Concern   Not on file  Social History Narrative   Lives with her husband. She is getting shots in her eyes which causes her not to be able to do many things she enjoyed doing before. She uses a magnifying light as needed. She enjoys cooking.   Social Drivers of Corporate investment banker Strain: Low Risk  (09/18/2023)   Overall Financial Resource Strain (CARDIA)    Difficulty of Paying Living Expenses: Not very hard  Food Insecurity: No Food Insecurity (09/18/2023)   Hunger Vital Sign    Worried About Running Out of Food in the Last Year: Never true    Ran Out of Food in the Last Year: Never true  Transportation Needs: No Transportation Needs (09/18/2023)   PRAPARE - Administrator, Civil Service (Medical): No    Lack of Transportation (Non-Medical): No   Physical Activity: Insufficiently Active (09/18/2023)   Exercise Vital Sign    Days of Exercise per Week: 1 day    Minutes of Exercise per Session: 40 min  Stress: No Stress Concern Present (09/18/2023)   [employer] of Occupational Health - Occupational Stress Questionnaire    Feeling of Stress : Only a little  Social Connections: Socially Integrated (09/18/2023)   Social Connection and Isolation Panel [NHANES]    Frequency of Communication with Friends and Family: More than three times a week    Frequency of Social Gatherings with Friends and Family: Twice a week    Attends Religious Services: More than 4 times per year    Active Member of [employer] or Organizations: Yes  Attends Engineer, structural: More than 4 times per year    Marital Status: Married    Tobacco Counseling Counseling given: Not Answered   Clinical Intake:  Pre-visit preparation completed: Yes  Pain : No/denies pain     BMI - recorded: 23.05 Nutritional Status: BMI of 19-24  Normal Nutritional Risks: None Diabetes: No  How often do you need to have someone help you when you read instructions, pamphlets, or other written materials from your doctor or pharmacy?: 1 - Never What is the last grade level you completed in school?: 16  Interpreter Needed?: No      Activities of Daily Living    09/18/2023    9:51 AM  In your present state of health, do you have any difficulty performing the following activities:  Hearing? 1  Comment In crowds  Vision? 1  Difficulty concentrating or making decisions? 0  Walking or climbing stairs? 0  Dressing or bathing? 0  Doing errands, shopping? 0  Preparing Food and eating ? N  Using the Toilet? N  In the past six months, have you accidently leaked urine? Y  Do you have problems with loss of bowel control? N  Managing your Medications? N  Managing your Finances? N  Housekeeping or managing your Housekeeping? N    Patient Care Team: Agapito Games, MD as PCP - General (Family Medicine) Tanya Nones, OD (Optometry) Campbell Stall, MD as Attending Physician (Dermatology) Christia Reading, MD as Attending Physician (Otolaryngology) Jearld Shines, DC (Chiropractic Medicine) Monica Becton, MD as Consulting Physician (Family Medicine)  Indicate any recent Medical Services you may have received from other than [facility] providers in the past year (date may be approximate).     Assessment:   This is a routine wellness examination for Orpha.  Hearing/Vision screen Hearing Screening - Comments:: Unable to test Vision Screening - Comments:: Unable to test   Goals Addressed             This Visit's Progress    Exercise 3x per week (30 min per time)       She would like to increase exercise to help with her bone density.       Depression Screen    09/18/2023   10:00 AM 02/20/2023   10:53 AM 09/12/2022    9:16 AM 09/08/2021   10:13 AM 07/08/2021   11:02 AM 07/22/2020    9:53 AM 07/07/2020    9:21 AM  PHQ 2/9 Scores  PHQ - 2 Score 0 0 1 0 0 1 1    Fall Risk    09/18/2023   10:06 AM 02/20/2023   10:53 AM 09/12/2022    9:20 AM 01/06/2022    8:59 AM 09/08/2021   10:12 AM  Fall Risk   Falls in the past year? 0 0 0 0 0  Number falls in past yr: 0 0 0 0 0  Injury with Fall? 0 0 0 0 0  Risk for fall due to : No Fall Risks No Fall Risks No Fall Risks No Fall Risks No Fall Risks  Follow up Falls evaluation completed Falls evaluation completed Falls evaluation completed Falls prevention discussed;Falls evaluation completed Falls evaluation completed    MEDICARE RISK AT HOME: Medicare Risk at Home Any stairs in or around the home?: Yes If so, are there any without handrails?: Yes Home free of loose throw rugs in walkways, pet beds, electrical cords, etc?: Yes Adequate lighting in your home to  reduce risk of falls?: Yes Life alert?: No Use of a cane, walker or w/c?: No Grab bars in the bathroom?: No Shower chair or bench  in shower?: Yes Elevated toilet seat or a handicapped toilet?: Yes  TIMED UP AND GO:  Was the test performed?  No    Cognitive Function:      02/20/2023   10:53 AM  Montreal Cognitive Assessment   Visuospatial/ Executive (0/5) 5  Naming (0/3) 3  Attention: Read list of digits (0/2) 2  Attention: Read list of letters (0/1) 1  Attention: Serial 7 subtraction starting at 100 (0/3) 2  Language: Repeat phrase (0/2) 2  Language : Fluency (0/1) 1  Abstraction (0/2) 2  Delayed Recall (0/5) 4  Orientation (0/6) 6  Total 28  Adjusted Score (based on education) 28      09/18/2023   10:07 AM 09/12/2022    9:28 AM 09/08/2021   10:22 AM 07/22/2020    9:44 AM 07/22/2019    9:51 AM  6CIT Screen  What Year? 0 points 0 points 0 points 0 points 0 points  What month? 0 points 0 points 0 points 0 points 0 points  What time? 0 points 0 points 0 points 0 points 0 points  Count back from 20 0 points 0 points 0 points 0 points 0 points  Months in reverse 0 points 2 points 0 points 0 points 0 points  Repeat phrase 6 points 4 points 0 points 0 points 2 points  Total Score 6 points 6 points 0 points 0 points 2 points    Immunizations Immunization History  Administered Date(s) Administered   Fluad Quad(high Dose 65+) 06/06/2020, 04/25/2022, 04/12/2023   Influenza Split 05/07/2012   Influenza, High Dose Seasonal PF 05/28/2013, 05/02/2017, 05/07/2018, 05/18/2019, 05/31/2021   Influenza,inj,Quad PF,6+ Mos 04/23/2014, 05/14/2015   Influenza-Unspecified 05/09/2016   PFIZER Comirnaty(Gray Top)Covid-19 Tri-Sucrose Vaccine 04/29/2022   PFIZER(Purple Top)SARS-COV-2 Vaccination 08/21/2019, 09/11/2019, 06/02/2020   PNEUMOCOCCAL CONJUGATE-20 01/06/2022   Pneumococcal Conjugate-13 07/01/2014   Pneumococcal Polysaccharide-23 08/01/2005   Td 08/01/2001   Tdap 08/01/2005, 10/07/2015   Zoster Recombinant(Shingrix) 09/05/2021, 03/11/2022    TDAP status: Up to date  Flu Vaccine status: Up to  date  Pneumococcal vaccine status: Up to date  Covid-19 vaccine status: Completed vaccines  Qualifies for Shingles Vaccine? Yes   Zostavax completed No   Shingrix Completed?: Yes  Screening Tests Health Maintenance  Topic Date Due   COVID-19 Vaccine (5 - 2024-25 season) 04/02/2023   Medicare Annual Wellness (AWV)  09/17/2024   DEXA SCAN  08/08/2025   DTaP/Tdap/Td (4 - Td or Tdap) 10/06/2025   Pneumonia Vaccine 39+ Years old  Completed   INFLUENZA VACCINE  Completed   Zoster Vaccines- Shingrix  Completed   HPV VACCINES  Aged Out    Health Maintenance  Health Maintenance Due  Topic Date Due   COVID-19 Vaccine (5 - 2024-25 season) 04/02/2023    Colorectal cancer screening: No longer required.   Mammogram status: No longer required due to age.  Bone Density status: Completed 08/09/2023. Results reflect: Bone density results: OSTEOPOROSIS. Repeat every 2 years.  Lung Cancer Screening: (Low Dose CT Chest recommended if Age 93-80 years, 20 pack-year currently smoking OR have quit w/in 15years.) does not qualify.   Lung Cancer Screening Referral: n/a  Additional Screening:  Hepatitis C Screening: does not qualify; Completed n/a  Vision Screening: Recommended annual ophthalmology exams for early detection of glaucoma and other disorders of the eye. Is the patient  up to date with their annual eye exam?  Yes  Who is the provider or what is the name of the office in which the patient attends annual eye exams? Dr Grayling Congress If pt is not established with a provider, would they like to be referred to a provider to establish care?  N/a .   Dental Screening: Recommended annual dental exams for proper oral hygiene   Community Resource Referral / Chronic Care Management: CRR required this visit?  No   CCM required this visit?  No     Plan:     I have personally reviewed and noted the following in the patient's chart:   Medical and social history Use of alcohol, tobacco or  illicit drugs  Current medications and supplements including opioid prescriptions. Patient is not currently taking opioid prescriptions. Functional ability and status Nutritional status Physical activity Advanced directives List of other physicians Hospitalizations, surgeries, and ER visits in previous 12 months Vitals Screenings to include cognitive, depression, and falls Referrals and appointments  In addition, I have reviewed and discussed with patient certain preventive protocols, quality metrics, and best practice recommendations. A written personalized care plan for preventive services as well as general preventive health recommendations were provided to patient.     Esmond Harps, CMA   09/18/2023   After Visit Summary: (MyChart) Due to this being a telephonic visit, the after visit summary with patients personalized plan was offered to patient via MyChart   Nurse Notes:   MARLON VONRUDEN is a 89 y.o. female patient of Metheney, Barbarann Ehlers, MD who had a Medicare Annual Wellness Visit today via telephone. Stephaine is Retired and lives with their spouse. she has 2 children. She reports that she is socially active and does interact with friends/family regularly. she is minimally physically active and enjoys cooking.

## 2023-09-18 NOTE — Patient Instructions (Signed)
  [name] , Thank you for taking time to come for your Medicare Wellness Visit. I appreciate your ongoing commitment to your health goals. Please review the following plan we discussed and let me know if I can assist you in the future.   These are the goals we discussed:  Goals       Exercise 3x per week (30 min per time)      Try to start walking at least 3 times a week for 30 minutes at a time.      Exercise 3x per week (30 min per time)      She would like to increase exercise to help with her bone density.       Medication Management      Patient Goals/Self-Care Activities Over the next 90 days, patient will:  take medications as prescribed, check blood pressure 1-2x per week at home, document, and provide at future discussions   Follow Up Plan: Telephone follow up appointment with care management team member scheduled for:  3 months      Patient Stated      To stay active and keep walking without a cane or walker.      Patient Stated (pt-stated)      09/08/2021 AWV Goal: Exercise for General Health  Patient will verbalize understanding of the benefits of increased physical activity: Exercising regularly is important. It will improve your overall fitness, flexibility, and endurance. Regular exercise also will improve your overall health. It can help you control your weight, reduce stress, and improve your bone density. Over the next year, patient will increase physical activity as tolerated with a goal of at least 150 minutes of moderate physical activity per week.  You can tell that you are exercising at a moderate intensity if your heart starts beating faster and you start breathing faster but can still hold a conversation. Moderate-intensity exercise ideas include: Walking 1 mile (1.6 km) in about 15 minutes Biking Hiking Golfing Dancing Water aerobics Patient will verbalize understanding of everyday activities that increase physical activity by providing examples like  the following: Yard work, such as: Insurance underwriter Gardening Washing windows or floors Patient will be able to explain general safety guidelines for exercising:  Before you start a new exercise program, talk with your health care provider. Do not exercise so much that you hurt yourself, feel dizzy, or get very short of breath. Wear comfortable clothes and wear shoes with good support. Drink plenty of water while you exercise to prevent dehydration or heat stroke. Work out until your breathing and your heartbeat get faster.       Patient Stated (pt-stated)      Patient stated that she would like to be able to maintain a healthy lifestyle.        This is a list of the screening recommended for you and due dates:  Health Maintenance  Topic Date Due   COVID-19 Vaccine (5 - 2024-25 season) 04/02/2023   Medicare Annual Wellness Visit  09/17/2024   DEXA scan (bone density measurement)  08/08/2025   DTaP/Tdap/Td vaccine (4 - Td or Tdap) 10/06/2025   Pneumonia Vaccine  Completed   Flu Shot  Completed   Zoster (Shingles) Vaccine  Completed   HPV Vaccine  Aged Out

## 2023-09-18 NOTE — Addendum Note (Signed)
 Addended by: Chalmers Cater on: 09/18/2023 01:37 PM   Modules accepted: Level of Service

## 2023-10-16 DIAGNOSIS — H43813 Vitreous degeneration, bilateral: Secondary | ICD-10-CM | POA: Diagnosis not present

## 2023-10-16 DIAGNOSIS — D3132 Benign neoplasm of left choroid: Secondary | ICD-10-CM | POA: Diagnosis not present

## 2023-10-16 DIAGNOSIS — H353231 Exudative age-related macular degeneration, bilateral, with active choroidal neovascularization: Secondary | ICD-10-CM | POA: Diagnosis not present

## 2023-11-26 ENCOUNTER — Other Ambulatory Visit: Payer: Self-pay | Admitting: Family Medicine

## 2023-11-26 DIAGNOSIS — J449 Chronic obstructive pulmonary disease, unspecified: Secondary | ICD-10-CM

## 2023-12-21 DIAGNOSIS — H524 Presbyopia: Secondary | ICD-10-CM | POA: Diagnosis not present

## 2023-12-27 DIAGNOSIS — D3132 Benign neoplasm of left choroid: Secondary | ICD-10-CM | POA: Diagnosis not present

## 2023-12-27 DIAGNOSIS — H43813 Vitreous degeneration, bilateral: Secondary | ICD-10-CM | POA: Diagnosis not present

## 2023-12-27 DIAGNOSIS — H353231 Exudative age-related macular degeneration, bilateral, with active choroidal neovascularization: Secondary | ICD-10-CM | POA: Diagnosis not present

## 2024-01-26 DIAGNOSIS — Z86018 Personal history of other benign neoplasm: Secondary | ICD-10-CM | POA: Diagnosis not present

## 2024-01-26 DIAGNOSIS — Z85828 Personal history of other malignant neoplasm of skin: Secondary | ICD-10-CM | POA: Diagnosis not present

## 2024-01-26 DIAGNOSIS — D2230 Melanocytic nevi of unspecified part of face: Secondary | ICD-10-CM | POA: Diagnosis not present

## 2024-01-26 DIAGNOSIS — D2272 Melanocytic nevi of left lower limb, including hip: Secondary | ICD-10-CM | POA: Diagnosis not present

## 2024-01-26 DIAGNOSIS — L821 Other seborrheic keratosis: Secondary | ICD-10-CM | POA: Diagnosis not present

## 2024-01-26 DIAGNOSIS — L578 Other skin changes due to chronic exposure to nonionizing radiation: Secondary | ICD-10-CM | POA: Diagnosis not present

## 2024-01-26 DIAGNOSIS — D225 Melanocytic nevi of trunk: Secondary | ICD-10-CM | POA: Diagnosis not present

## 2024-01-26 DIAGNOSIS — D2261 Melanocytic nevi of right upper limb, including shoulder: Secondary | ICD-10-CM | POA: Diagnosis not present

## 2024-02-01 ENCOUNTER — Other Ambulatory Visit: Payer: Self-pay | Admitting: Family Medicine

## 2024-02-01 DIAGNOSIS — I10 Essential (primary) hypertension: Secondary | ICD-10-CM

## 2024-02-19 ENCOUNTER — Encounter: Payer: Self-pay | Admitting: Family Medicine

## 2024-02-19 ENCOUNTER — Ambulatory Visit (INDEPENDENT_AMBULATORY_CARE_PROVIDER_SITE_OTHER): Payer: Medicare HMO | Admitting: Family Medicine

## 2024-02-19 VITALS — BP 136/82 | HR 78 | Ht 63.0 in | Wt 118.1 lb

## 2024-02-19 DIAGNOSIS — M79604 Pain in right leg: Secondary | ICD-10-CM

## 2024-02-19 DIAGNOSIS — M810 Age-related osteoporosis without current pathological fracture: Secondary | ICD-10-CM | POA: Diagnosis not present

## 2024-02-19 DIAGNOSIS — I10 Essential (primary) hypertension: Secondary | ICD-10-CM | POA: Diagnosis not present

## 2024-02-19 DIAGNOSIS — J449 Chronic obstructive pulmonary disease, unspecified: Secondary | ICD-10-CM | POA: Diagnosis not present

## 2024-02-19 NOTE — Assessment & Plan Note (Signed)
 DEXA scan up-to-date T-score -2.6 performed in January.  Will check vitamin D  levels today to make sure that they are adequate she has had prior vitamin D  deficiency but we have not checked it in almost 5 years.

## 2024-02-19 NOTE — Progress Notes (Signed)
 Established Patient Office Visit  Subjective  Patient ID: [name], female    DOB: Jan 27, 1933  Age: 89 y.o. MRN: [id number]  Chief Complaint  Patient presents with   Hypertension    HPI  Hypertension- Pt denies chest pain, SOB, dizziness, or heart palpitations.  Taking meds as directed w/o problems.  Denies medication side effects.   Brought in home blood pressure log as well.  Most of the blood pressures at home have been between 120s to 150s.  The lowest pressure was 114/68.  And the highest pressure that she recorded was 156/80.  She is still able to get out in her yard and work which is what brings a lot of joy for her.  And helps keep her mood in a good place she still able to prepare meals she does have's have someone come and help clean the house about every 2 weeks.  She and her husband are still able to go to church pretty regularly.  She has been battling bursitis in her right leg and hip.  She says usually about once a year she will have a flare with it she is using MSM and will usually do her stretches.  So she feels like she is doing okay.  Does have her eye appointment scheduled for next week for her next shot for her macular degeneration..  She did see her dentist and her dermatologist recently.    ROS    Objective:     BP 136/82   Pulse 78   Ht 5' 3 (1.6 m)   Wt 118 lb 1.9 oz (53.6 kg)   SpO2 100%   BMI 20.92 kg/m    Physical Exam Vitals and nursing note reviewed.  Constitutional:      Appearance: Normal appearance.  HENT:     Head: Normocephalic and atraumatic.  Eyes:     Conjunctiva/sclera: Conjunctivae normal.  Cardiovascular:     Rate and Rhythm: Normal rate and regular rhythm.  Pulmonary:     Effort: Pulmonary effort is normal.     Breath sounds: Normal breath sounds.  Skin:    General: Skin is warm and dry.  Neurological:     Mental Status: She is alert.  Psychiatric:        Mood and Affect: Mood normal.      No results found  for any visits on 02/19/24.    The ASCVD Risk score (Arnett DK, et al., 2019) failed to calculate for the following reasons:   The 2019 ASCVD risk score is only valid for ages 70 to 77    Assessment & Plan:   Problem List Items Addressed This Visit       Cardiovascular and Mediastinum   Essential hypertension - Primary   Blood pressures look great today she brought in home log as well and those look like they are at goal even though there is somewhat of a range.  Will get updated labs today.      Relevant Orders   CMP14+EGFR   Lipid panel   CBC   VITAMIN D  25 Hydroxy (Vit-D Deficiency, Fractures)     Respiratory   COPD (chronic obstructive pulmonary disease) (HCC)   Ports that she is actually doing well.  She tries to stay inside on those more hot and humid days she has not had any recent flares or exacerbations.  She is on Anoro and uses albuterol .        Musculoskeletal and Integument   Osteoporosis  DEXA scan up-to-date T-score -2.6 performed in January.  Will check vitamin D  levels today to make sure that they are adequate she has had prior vitamin D  deficiency but we have not checked it in almost 5 years.      Relevant Orders   CBC   VITAMIN D  25 Hydroxy (Vit-D Deficiency, Fractures)   Other Visit Diagnoses       Right leg pain          Right leg pain-she says right now she is managing her bursitis but just encouraged her to let us  know if it does not get better like it typically does.  Return in about 6 months (around 08/21/2024) for Hypertension.    Dorothyann Byars, MD

## 2024-02-19 NOTE — Assessment & Plan Note (Signed)
 Blood pressures look great today she brought in home log as well and those look like they are at goal even though there is somewhat of a range.  Will get updated labs today.

## 2024-02-19 NOTE — Assessment & Plan Note (Signed)
 Ports that she is actually doing well.  She tries to stay inside on those more hot and humid days she has not had any recent flares or exacerbations.  She is on Anoro and uses albuterol .

## 2024-02-20 ENCOUNTER — Ambulatory Visit: Payer: Self-pay | Admitting: Family Medicine

## 2024-02-20 LAB — LIPID PANEL
Chol/HDL Ratio: 2.2 ratio (ref 0.0–4.4)
Cholesterol, Total: 148 mg/dL (ref 100–199)
HDL: 67 mg/dL (ref >39)
LDL Chol Calc (NIH): 66 mg/dL (ref 0–99)
Triglycerides: 77 mg/dL (ref 0–149)
VLDL Cholesterol Cal: 15 mg/dL (ref 5–40)

## 2024-02-20 LAB — CMP14+EGFR
ALT: 21 IU/L (ref 0–32)
AST: 25 IU/L (ref 0–40)
Albumin: 4.3 g/dL (ref 3.7–4.7)
Alkaline Phosphatase: 122 IU/L — ABNORMAL HIGH (ref 44–121)
BUN/Creatinine Ratio: 24 (ref 12–28)
BUN: 18 mg/dL (ref 8–27)
Bilirubin Total: 0.4 mg/dL (ref 0.0–1.2)
CO2: 21 mmol/L (ref 20–29)
Calcium: 10.5 mg/dL — ABNORMAL HIGH (ref 8.7–10.3)
Chloride: 104 mmol/L (ref 96–106)
Creatinine, Ser: 0.75 mg/dL (ref 0.57–1.00)
Globulin, Total: 2.1 g/dL (ref 1.5–4.5)
Glucose: 87 mg/dL (ref 70–99)
Potassium: 4.3 mmol/L (ref 3.5–5.2)
Sodium: 142 mmol/L (ref 134–144)
Total Protein: 6.4 g/dL (ref 6.0–8.5)
eGFR: 76 mL/min/1.73 (ref >59)

## 2024-02-20 LAB — CBC
Hematocrit: 46.2 % (ref 34.0–46.6)
Hemoglobin: 14.6 g/dL (ref 11.1–15.9)
MCH: 29.7 pg (ref 26.6–33.0)
MCHC: 31.6 g/dL (ref 31.5–35.7)
MCV: 94 fL (ref 79–97)
Platelets: 217 x10E3/uL (ref 150–450)
RBC: 4.91 x10E6/uL (ref 3.77–5.28)
RDW: 12.4 % (ref 11.7–15.4)
WBC: 8.2 x10E3/uL (ref 3.4–10.8)

## 2024-02-20 LAB — VITAMIN D 25 HYDROXY (VIT D DEFICIENCY, FRACTURES): Vit D, 25-Hydroxy: 60.7 ng/mL (ref 30.0–100.0)

## 2024-02-20 NOTE — Progress Notes (Signed)
 [name],  calcium  level is stable.  Alkaline phosphatase which is one of his liver enzymes is just slightly elevated by 1 point not in a worrisome range and it is basically about what it has been before.  Cholesterol looks good.  Blood counts normal.  Vitamin D  looks fantastic!

## 2024-03-01 ENCOUNTER — Other Ambulatory Visit: Payer: Self-pay | Admitting: Family Medicine

## 2024-03-01 DIAGNOSIS — I700 Atherosclerosis of aorta: Secondary | ICD-10-CM

## 2024-03-06 DIAGNOSIS — H353231 Exudative age-related macular degeneration, bilateral, with active choroidal neovascularization: Secondary | ICD-10-CM | POA: Diagnosis not present

## 2024-03-06 DIAGNOSIS — H43813 Vitreous degeneration, bilateral: Secondary | ICD-10-CM | POA: Diagnosis not present

## 2024-03-06 DIAGNOSIS — D3132 Benign neoplasm of left choroid: Secondary | ICD-10-CM | POA: Diagnosis not present

## 2024-04-02 ENCOUNTER — Encounter: Payer: Self-pay | Admitting: Sports Medicine

## 2024-04-09 ENCOUNTER — Encounter: Payer: Self-pay | Admitting: Family Medicine

## 2024-04-09 ENCOUNTER — Ambulatory Visit: Payer: Self-pay

## 2024-04-09 ENCOUNTER — Ambulatory Visit (INDEPENDENT_AMBULATORY_CARE_PROVIDER_SITE_OTHER): Admitting: Family Medicine

## 2024-04-09 VITALS — BP 162/80 | HR 79 | Ht 63.0 in | Wt 118.0 lb

## 2024-04-09 DIAGNOSIS — M7062 Trochanteric bursitis, left hip: Secondary | ICD-10-CM | POA: Diagnosis not present

## 2024-04-09 MED ORDER — CELECOXIB 100 MG PO CAPS: ORAL_CAPSULE | ORAL | 0 refills | Status: AC

## 2024-04-09 NOTE — Telephone Encounter (Signed)
 FYI Only or Action Required?: FYI only for provider.  Patient was last seen in primary care on 02/19/2024 by Alvan Dorothyann BIRCH, MD.  Called Nurse Triage reporting Hip Pain.  Symptoms began several years ago.  Interventions attempted: Nothing.  Symptoms are: gradually worsening.  Triage Disposition: See HCP Within 4 Hours (Or PCP Triage)  Patient/caregiver understands and will follow disposition?: Yes, will follow disposition  Copied from CRM [id number]. Topic: Clinical - Red Word Triage >> Apr 09, 2024  1:59 PM Susanna ORN wrote: Red Word that prompted transfer to Nurse Triage: Patient states she's having pain in hip from bursitis. States this is the first time it has flared up on her. Wanting to make an appt with Dr. Alvan. Reason for Disposition  [1] SEVERE pain (e.g., excruciating, unable to do any normal activities) AND [2] not improved after 2 hours of pain medicine  Answer Assessment - Initial Assessment Questions 1. LOCATION and RADIATION: Where is the pain located? Does the pain spread (shoot) anywhere else?     L hip,  2. QUALITY: What does the pain feel like?  (e.g., sharp, dull, aching, burning)     Excruciating with any movement, no good position 3. SEVERITY: How bad is the pain? What does it keep you from doing?   (Scale 1-10; or mild, moderate, severe)     severe 4. ONSET: When did the pain start? Does it come and go, or is it there all the time?     Ongoing intermittently for years, states this time for a few weeks 5. WORK OR EXERCISE: Has there been any recent work or exercise that involved this part of the body?      denies 6. CAUSE: What do you think is causing the hip pain?      Hx of bursitis, was working in the yard recently 7. AGGRAVATING FACTORS: What makes the hip pain worse? (e.g., walking, climbing stairs, running)     Any movement 8. OTHER SYMPTOMS: Do you have any other symptoms? (e.g., back pain, pain shooting down leg,  fever,  rash)     Radiates from hip down  Protocols used: Hip Pain-A-AH

## 2024-04-09 NOTE — Assessment & Plan Note (Signed)
 She may continue MSM.  Discussed addition of topical Voltaren  gel.  Prescription for Celebrex  sent in which she may add if not improving with MSM and Voltaren .  Will also get her scheduled with sports med in case she needs injection if refractory to conservative treatment.

## 2024-04-09 NOTE — Patient Instructions (Addendum)
 Try application of diclofenac  gel to area.  Try celebrex  twice per day.  I have placed referral to sports medicine.

## 2024-04-09 NOTE — Progress Notes (Signed)
 [name] - 89 y.o. female MRN [id number]  Date of birth: 1932-11-01  Subjective Chief Complaint  Patient presents with   Hip Pain    HPI [name] is a 89 y.o. here today with complaint of L hip pain.  Symptoms started a few days ago.  She had been more work in her yard throughout the week.  She has had bursitis of the hip in the past.  She denies any recent falls or injury to the hip.  Her bursitis has resolved on its own in the past with MSM and stretching.  She feels like this episode is worse than previous episodes.  She did try MSM and only got a little relief from this.  ROS:  A comprehensive ROS was completed and negative except as noted per HPI  Allergies  Allergen Reactions   Erythromycin Swelling   Penicillins Swelling, Rash and Hives    Past Medical History:  Diagnosis Date   Asthma, exercise induced    Difficulty hearing    History of parathyroid disease    HTN (hypertension)    Macular degeneration    Migraine headache without aura    OP (osteoporosis)    Problems related to lack of adequate sleep     Past Surgical History:  Procedure Laterality Date   ABDOMINAL HYSTERECTOMY     she thinks ovaries removed as well.     Anterior acromionectomy  07/31/00   left   BREAST LUMPECTOMY  1990   CATARACT EXTRACTION  [id number]   PARATHYROIDECTOMY  09/06/04   Minimally invasive parathyroid surgery by Dr. Signe Dickens in [place], [place] .   repair of torn rotator cuff  07/31/00   left, and 2007   TONSILLECTOMY      Social History   Socioeconomic History   Marital status: Married    Spouse name: Nancyann   Number of children: 2   Years of education: 16   Highest education level: Bachelor's degree (e.g., BA, AB, BS)  Occupational History   Occupation: Advertising account executive: RETIRED    Comment: retired  Tobacco Use   Smoking status: Never   Smokeless tobacco: Never  Vaping Use   Vaping status: Never Used  Substance and Sexual Activity   Alcohol use:  No   Drug use: No   Sexual activity: Yes    Partners: Male  Other Topics Concern   Not on file  Social History Narrative   Lives with her husband. She is getting shots in her eyes which causes her not to be able to do many things she enjoyed doing before. She uses a magnifying light as needed. She enjoys cooking.   Social Drivers of Corporate investment banker Strain: Low Risk  (09/18/2023)   Overall Financial Resource Strain (CARDIA)    Difficulty of Paying Living Expenses: Not very hard  Food Insecurity: No Food Insecurity (09/18/2023)   Hunger Vital Sign    Worried About Running Out of Food in the Last Year: Never true    Ran Out of Food in the Last Year: Never true  Transportation Needs: No Transportation Needs (09/18/2023)   PRAPARE - Administrator, Civil Service (Medical): No    Lack of Transportation (Non-Medical): No  Physical Activity: Insufficiently Active (09/18/2023)   Exercise Vital Sign    Days of Exercise per Week: 1 day    Minutes of Exercise per Session: 40 min  Stress: No Stress Concern Present (09/18/2023)  [employer] of Occupational Health - Occupational Stress Questionnaire    Feeling of Stress : Only a little  Social Connections: Socially Integrated (09/18/2023)   Social Connection and Isolation Panel    Frequency of Communication with Friends and Family: More than three times a week    Frequency of Social Gatherings with Friends and Family: Twice a week    Attends Religious Services: More than 4 times per year    Active Member of Clubs or Organizations: Yes    Attends Engineer, structural: More than 4 times per year    Marital Status: Married    Family History  Problem Relation Age of Onset   Heart disease Father    Cancer Mother        tumors on parathyroid, uterine   Kidney disease Mother     Health Maintenance  Topic Date Due   Influenza Vaccine  03/01/2024   COVID-19 Vaccine (5 - 2025-26 season) 04/01/2024   Medicare  Annual Wellness (AWV)  09/17/2024   DEXA SCAN  08/08/2025   DTaP/Tdap/Td (4 - Td or Tdap) 10/06/2025   Pneumococcal Vaccine: 50+ Years  Completed   Zoster Vaccines- Shingrix  Completed   HPV VACCINES  Aged Out   Meningococcal B Vaccine  Aged Out     ----------------------------------------------------------------------------------------------------------------------------------------------------------------------------------------------------------------- Physical Exam BP (!) 162/80   Pulse 79   Ht 5' 3 (1.6 m)   Wt 118 lb (53.5 kg)   SpO2 96%   BMI 20.90 kg/m   Physical Exam Constitutional:      Appearance: Normal appearance.  Eyes:     General: No scleral icterus. Cardiovascular:     Rate and Rhythm: Normal rate and regular rhythm.  Pulmonary:     Effort: Pulmonary effort is normal.     Breath sounds: Normal breath sounds.  Musculoskeletal:     Cervical back: Neck supple.  Neurological:     Mental Status: She is alert.  Psychiatric:        Mood and Affect: Mood normal.        Behavior: Behavior normal.     ------------------------------------------------------------------------------------------------------------------------------------------------------------------------------------------------------------------- Assessment and Plan  Greater trochanteric bursitis of left hip She may continue MSM.  Discussed addition of topical Voltaren  gel.  Prescription for Celebrex  sent in which she may add if not improving with MSM and Voltaren .  Will also get her scheduled with sports med in case she needs injection if refractory to conservative treatment.   Meds ordered this encounter  Medications   celecoxib  (CELEBREX ) 100 MG capsule    Sig: One to 2 tablets by mouth daily as needed for pain.    Dispense:  20 capsule    Refill:  0    No follow-ups on file.

## 2024-04-09 NOTE — Telephone Encounter (Signed)
 Patient scheduled.

## 2024-04-22 ENCOUNTER — Ambulatory Visit

## 2024-04-25 ENCOUNTER — Ambulatory Visit (INDEPENDENT_AMBULATORY_CARE_PROVIDER_SITE_OTHER)

## 2024-04-25 VITALS — Temp 98.0°F

## 2024-04-25 DIAGNOSIS — Z23 Encounter for immunization: Secondary | ICD-10-CM | POA: Insufficient documentation

## 2024-04-25 NOTE — Progress Notes (Signed)
   Established Patient Office Visit  Subjective   Patient ID: [name], female    DOB: [birth year]  Age: 89 y.o. MRN: [id number]  Chief Complaint  Patient presents with   Flu Vaccine    Flu vaccine - nurse visit.     HPI  Flu vaccine- patient denies problems with any vaccines in past. Denies allergy to eggs or latex. Patient denies any history of Guillan Barre syndrome.  Patient is feeling well today.   ROS    Objective:     Temp 98 F (36.7 C)    Physical Exam   No results found for any visits on 04/25/24.    The ASCVD Risk score (Arnett DK, et al., 2019) failed to calculate for the following reasons:   The 2019 ASCVD risk score is only valid for ages 34 to 24    Assessment & Plan:  High Dose flu vaccine admin left deltoid IM . Patient tolerated injection well without complications.  Problem List Items Addressed This Visit   None Visit Diagnoses       Need for influenza vaccination    -  Primary   Relevant Orders   Flu Vaccine Trivalent High Dose (Fluad)       No follow-ups on file.    Suzen SHAUNNA Plenty, LPN

## 2024-04-25 NOTE — Progress Notes (Addendum)
   Established Patient Office Visit  Subjective   Patient ID: [name], female    DOB: 12/10/1932  Age: 89 y.o. MRN: [id number]  Chief Complaint  Patient presents with   Flu Vaccine    Flu vaccine - nurse visit.     HPI  High dose flu vaccine - nurse visit. Patient denies any reaction to previous reaction to any vaccine or  allergy to eggs or latex. Patient denies history of Guillian Barre syndrome. Patient feels well today.   ROS    Objective:     Temp 98 F (36.7 C)    Physical Exam   No results found for any visits on 04/25/24.    The ASCVD Risk score (Arnett DK, et al., 2019) failed to calculate for the following reasons:   The 2019 ASCVD risk score is only valid for ages 66 to 59    Assessment & Plan:   Admin High dose flu vaccine  IM left deltoid. Patient tolerated injection well without complications.  Problem List Items Addressed This Visit       Other   Need for influenza vaccination - Primary   Relevant Orders   Flu vaccine HIGH DOSE PF(Fluzone Trivalent) (Completed)    Return for return as needed.    Suzen SHAUNNA Plenty, LPN

## 2024-04-25 NOTE — Patient Instructions (Signed)
 Return as needed

## 2024-04-29 ENCOUNTER — Ambulatory Visit

## 2024-04-30 ENCOUNTER — Ambulatory Visit

## 2024-04-30 ENCOUNTER — Other Ambulatory Visit: Payer: Self-pay

## 2024-04-30 VITALS — BP 138/60 | Ht 63.0 in | Wt 118.0 lb

## 2024-04-30 DIAGNOSIS — M810 Age-related osteoporosis without current pathological fracture: Secondary | ICD-10-CM

## 2024-04-30 MED ORDER — ALENDRONATE SODIUM 70 MG PO TABS: 70.0000 mg | ORAL_TABLET | ORAL | 4 refills | Status: AC

## 2024-04-30 NOTE — Progress Notes (Signed)
   Subjective:    Patient ID: [name], female    DOB: 89 y.o., Jul 13, 1933   MRN: [id number]  HPI  Chief Complaint: Left hip pain  89 year old female with past medical history significant for hypertension, COPD, asthma, osteoporosis, CMC joint arthritis, greater trochanteric bursitis of the left hip presenting with left hip pain.  Review of pertinent imaging: Review of the bilateral AP standing pelvis which was obtained with a right hip plain film series on 10/10/2013 revealing degenerative changes of the left femoral acetabular joint including subchondral sclerosis and joint space narrowing.  No more recent imaging available of left hip.  Pt reporting today that her hip pain, though intermittent typically, is currently resolved.  She does request advice on treatment of her osteoprorosis recently diagnosed by a DEXA T score of -2.6. No known hx of fragility fracture.  I have independently reviewed her DEXA scan from 08/09/2023 revealing a 4.3% loss of BMD in the hips compared to prior study on 08/23/2022.    Objective:   Physical Exam Vitals:   04/30/24 1416  BP: 138/60   Const: appears well, non-toxic, well groomed Psych: affect bright, interactive, smiling EENT: EOMI intact, conjunctiva appear normal Neck: no obvious masses, appears symmetric Resp: non-labored, appears symmetric Neuro: muscle bulk appears normal Skin: no obvious rashes noted  Assessment & Plan:   [name] is a 89 y.o. female presenting with osteoporosis. After re-acquainting myself with the most up to date societal treatment guidelines for osteoporosis, I have sent her a message recommending the initiation of bisphosphonate therapy, specifically fosamax in addition to 1000mg  calcium  800IU vit D daily. She has no hx of CKD, severe dental disease, or esophageal disorders, or hypocalcemia. I recommend she follow up on an annual basis (or sooner as needed) with myself to check in on how she is tolerating this therapy. Plan  for drug holiday in 5 yrs.  Lab Results  Component Value Date   CREATININE 0.75 02/19/2024

## 2024-05-22 DIAGNOSIS — D3132 Benign neoplasm of left choroid: Secondary | ICD-10-CM | POA: Diagnosis not present

## 2024-05-22 DIAGNOSIS — H353231 Exudative age-related macular degeneration, bilateral, with active choroidal neovascularization: Secondary | ICD-10-CM | POA: Diagnosis not present

## 2024-05-22 DIAGNOSIS — H43813 Vitreous degeneration, bilateral: Secondary | ICD-10-CM | POA: Diagnosis not present

## 2024-05-26 ENCOUNTER — Other Ambulatory Visit: Payer: Self-pay | Admitting: Family Medicine

## 2024-05-26 DIAGNOSIS — J449 Chronic obstructive pulmonary disease, unspecified: Secondary | ICD-10-CM

## 2024-08-09 ENCOUNTER — Other Ambulatory Visit: Payer: Self-pay | Admitting: Family Medicine

## 2024-08-09 DIAGNOSIS — I10 Essential (primary) hypertension: Secondary | ICD-10-CM

## 2024-08-21 ENCOUNTER — Telehealth: Payer: Self-pay | Admitting: Family Medicine

## 2024-08-21 ENCOUNTER — Other Ambulatory Visit: Payer: Self-pay | Admitting: Family Medicine

## 2024-08-21 ENCOUNTER — Ambulatory Visit: Admitting: Family Medicine

## 2024-08-21 ENCOUNTER — Encounter: Payer: Self-pay | Admitting: Family Medicine

## 2024-08-21 VITALS — BP 130/72 | HR 74 | Ht 63.0 in | Wt 117.0 lb

## 2024-08-21 DIAGNOSIS — I10 Essential (primary) hypertension: Secondary | ICD-10-CM | POA: Diagnosis not present

## 2024-08-21 DIAGNOSIS — H353230 Exudative age-related macular degeneration, bilateral, stage unspecified: Secondary | ICD-10-CM | POA: Diagnosis not present

## 2024-08-21 DIAGNOSIS — M810 Age-related osteoporosis without current pathological fracture: Secondary | ICD-10-CM | POA: Diagnosis not present

## 2024-08-21 DIAGNOSIS — J449 Chronic obstructive pulmonary disease, unspecified: Secondary | ICD-10-CM

## 2024-08-21 MED ORDER — SPIRIVA RESPIMAT 2.5 MCG/ACT IN AERS: 2.0000 | INHALATION_SPRAY | Freq: Every day | RESPIRATORY_TRACT | 3 refills | Status: DC

## 2024-08-21 NOTE — Assessment & Plan Note (Signed)
" °  Age-related osteoporosis - Ordered blood work including vitamin D  level.  "

## 2024-08-21 NOTE — Assessment & Plan Note (Signed)
" °  Essential hypertension Blood pressure well-controlled at home, around 120 mmHg. - Continue current management and monitor blood pressure at home. "

## 2024-08-21 NOTE — Telephone Encounter (Signed)
 Left message advising of new inhaler and for a return call.

## 2024-08-21 NOTE — Addendum Note (Signed)
 Addended by: Tequisha Maahs D on: 08/21/2024 01:12 PM   Modules accepted: Orders

## 2024-08-21 NOTE — Telephone Encounter (Signed)
 Pls call pt and let her know I sent in a new inhaler in place of the Anoro.  If any cost challenges, please let me know

## 2024-08-21 NOTE — Telephone Encounter (Signed)
 Per [facility] - a prior authorization is required for Incruse Ellipta . Thanks in advance.

## 2024-08-21 NOTE — Assessment & Plan Note (Signed)
 Chronic obstructive pulmonary disease COPD with exercise-induced symptoms. Differential includes exercise-induced asthma versus COPD. Concerns about oral thrush from Anoro. Potential switch to anticholinergic inhaler if COPD confirmed. - Reviewed past spirometry results and considered updated pulmonary function test. - Consider switching to anticholinergic inhaler if COPD diagnosis is confirmed. - Continue Anoro and albuterol  as needed.

## 2024-08-21 NOTE — Assessment & Plan Note (Addendum)
 Getting injection regularly with Dr. Selinda Slocumb

## 2024-08-21 NOTE — Progress Notes (Addendum)
 "  Established Patient Office Visit  Patient ID: [name], female    DOB: April 27, 1933  Age: 89 y.o. MRN: [id number] PCP: Alvan Cheryl BIRCH, MD  Chief Complaint  Patient presents with   Hypertension    Subjective:     HPI  Discussed the use of AI scribe software for clinical note transcription with the patient, who gave verbal consent to proceed.  History of Present Illness RHEANNE Young is an 89 year old female with COPD who presents for a follow-up on her respiratory condition and medication management.  Dyspnea and respiratory symptoms - Experiences episodes of shortness of breath, particularly when hurrying to church or during emotional stress - Uses albuterol  as needed, often before leaving the house - On Anoro for maintenance, initially dx w/ exercise-induced symptoms back in 2008, but last spirometry done in 2015 showed parameters indicative of COPD with an  FEV1/FVC ratio of 59% with no response to albuterol  - Remains active, managing household tasks, cooking, and attending church and family gatherings - Does not sit often and sometimes feels overwhelmed by her activities  Inhaler-related oral and dental changes - Notices a 'dark place on her lip' and dental issues, attributed to inhaler use - Rinses mouth after using Anoro to prevent thrush  Sleep disturbance - Occasionally wakes at night, possibly due to nocturia, and sometimes struggles to return to sleep - Generally experiences good sleep - Occasionally uses music to help fall asleep - Does not take any sleep medications and prefers to manage sleep naturally - Takes naps during the day if needed  Blood pressure monitoring - Monitors blood pressure at home, with readings often around 120 mmHg - Does not record blood pressure readings  Ophthalmologic history - History of macular degeneration - Receives injections in both eyes every ten weeks  Osteoporosis - History of osteoporosis      ROS     Objective:     BP 130/72   Pulse 74   Ht 5' 3 (1.6 m)   Wt 117 lb (53.1 kg)   SpO2 100%   BMI 20.73 kg/m     Physical Exam Vitals and nursing note reviewed.  Constitutional:      Appearance: Normal appearance.  HENT:     Head: Normocephalic and atraumatic.  Eyes:     Conjunctiva/sclera: Conjunctivae normal.  Cardiovascular:     Rate and Rhythm: Normal rate and regular rhythm.  Pulmonary:     Effort: Pulmonary effort is normal.     Breath sounds: Normal breath sounds.  Skin:    General: Skin is warm and dry.  Neurological:     Mental Status: She is alert.  Psychiatric:        Mood and Affect: Mood normal.      No results found for any visits on 08/21/24.     The ASCVD Risk score (Arnett DK, et al., 2019) failed to calculate for the following reasons:   The 2019 ASCVD risk score is only valid for ages 33 to 28   * - Cholesterol units were assumed    Assessment & Plan:   Problem List Items Addressed This Visit       Cardiovascular and Mediastinum   Essential hypertension - Primary    Essential hypertension Blood pressure well-controlled at home, around 120 mmHg. - Continue current management and monitor blood pressure at home.      Relevant Orders   CMP14+EGFR     Respiratory   COPD (chronic obstructive pulmonary disease) (  HCC)   Chronic obstructive pulmonary disease COPD with exercise-induced symptoms. Differential includes exercise-induced asthma versus COPD. Concerns about oral thrush from Anoro. Potential switch to anticholinergic inhaler if COPD confirmed. - Reviewed past spirometry results and considered updated pulmonary function test. - Consider switching to anticholinergic inhaler if COPD diagnosis is confirmed. - Continue Anoro and albuterol  as needed.      Relevant Medications   Tiotropium Bromide  (SPIRIVA  RESPIMAT) 2.5 MCG/ACT AERS   Other Relevant Orders   CMP14+EGFR     Musculoskeletal and Integument   Osteoporosis     Age-related osteoporosis - Ordered blood work including vitamin D  level.       Relevant Orders   CMP14+EGFR     Other   Exudative age-related macular degeneration, bilateral, stage unspecified (HCC)   Getting injection regularly with Dr. Selinda Slocumb      Relevant Orders   CMP14+EGFR   Other Visit Diagnoses       Hypercalcemia            Hypercalcemia - due to recheck labs.   Assessment and Plan Assessment & Plan       Return in about 6 months (around 02/18/2025) for Hypertension.    Cheryl Byars, MD [facility] at [facility]   "

## 2024-08-22 ENCOUNTER — Telehealth: Payer: Self-pay

## 2024-08-22 ENCOUNTER — Ambulatory Visit: Payer: Self-pay | Admitting: Family Medicine

## 2024-08-22 ENCOUNTER — Other Ambulatory Visit (HOSPITAL_COMMUNITY): Payer: Self-pay

## 2024-08-22 LAB — CMP14+EGFR
ALT: 24 IU/L (ref 0–32)
AST: 25 IU/L (ref 0–40)
Albumin: 4.5 g/dL (ref 3.7–4.7)
Alkaline Phosphatase: 122 IU/L (ref 48–129)
BUN/Creatinine Ratio: 22 (ref 12–28)
BUN: 17 mg/dL (ref 8–27)
Bilirubin Total: 0.7 mg/dL (ref 0.0–1.2)
CO2: 20 mmol/L (ref 20–29)
Calcium: 10.5 mg/dL — ABNORMAL HIGH (ref 8.7–10.3)
Chloride: 106 mmol/L (ref 96–106)
Creatinine, Ser: 0.77 mg/dL (ref 0.57–1.00)
Globulin, Total: 2.3 g/dL (ref 1.5–4.5)
Glucose: 100 mg/dL — ABNORMAL HIGH (ref 70–99)
Potassium: 4.3 mmol/L (ref 3.5–5.2)
Sodium: 144 mmol/L (ref 134–144)
Total Protein: 6.8 g/dL (ref 6.0–8.5)
eGFR: 74 mL/min/1.73

## 2024-08-22 NOTE — Telephone Encounter (Signed)
 Documented this in patient calls message.

## 2024-08-22 NOTE — Telephone Encounter (Signed)
 Pharmacy Patient Advocate Encounter   Received notification from Physician's Office that prior authorization for INCRUSE ELLIPTA  is required/requested.   Insurance verification completed.   The patient is insured through [employer].   Per test claim: The current 30 day co-pay is, $356.64.  No PA needed at this time. This test claim was processed through [facility]- copay amounts may vary at other pharmacies due to pharmacy/plan contracts, or as the patient moves through the different stages of their insurance plan.    Patient may be eligible for a Medicare prescription Payment plan. The patient will need to reach out to their insurance company to enrol in the payment plan to spread out their payments throughout the year, If available. This test claim was processed through [facility]- copay amounts may vary at other pharmacies due to pharmacy/plan contracts, or as the patient moves through the different stages of their insurance plan.

## 2024-08-22 NOTE — Telephone Encounter (Signed)
 Copied from CRM #[id number]. Topic: General - Other >> Aug 22, 2024  1:27 PM Sophia H wrote: Reason for CRM: Returning call to [facility] - Not available per CAL.   Advised per chart note from PCP Pls call pt and let her know I sent in a new inhaler in place of the Anoro.  If any cost challenges, please let me know Patient verbalized understanding, states she will contact pharmacy and reach out if any issues.

## 2024-08-22 NOTE — Telephone Encounter (Signed)
 Attempted call to patient. Left a voice mail message requesting a return call.

## 2024-08-22 NOTE — Telephone Encounter (Signed)
 Sending as an FINANCIAL PLANNER -   Per [employer] - no prior authorization required for Incruse Ellipta . The 30 day copay through [facility] is $356.64 (pricing may vary [facility]). A MyChart message has been sent to the patient regarding the information received from her insurance.

## 2024-08-22 NOTE — Progress Notes (Signed)
 [name], blood test looks good your calcium  is just slightly elevated by 2/10 of a point so just minor.  And if you are taking extra calcium  for your bones that is the most likely explanation.

## 2024-08-22 NOTE — Telephone Encounter (Signed)
 Pharmacy Patient Advocate Encounter   Received notification from Physician's Office that prior authorization for INCRUSE ELLIPTA  is required/requested.   Insurance verification completed.   The patient is insured through [employer].   Per test claim: The current 30 day co-pay is, $356.64.  No PA needed at this time. This test claim was processed through [facility]- copay amounts may vary at other pharmacies due to pharmacy/plan contracts, or as the patient moves through the different stages of their insurance plan.    Patient may be eligible for a Medicare prescription Payment plan. The patient will need to reach out to their insurance company to enrol in the payment plan to spread out their payments throughout the year, If available. This test claim was processed through [facility]- copay amounts may vary at other pharmacies due to pharmacy/plan contracts, or as the patient moves through the different stages of their insurance plan.

## 2024-08-22 NOTE — Telephone Encounter (Signed)
 Message from [facility] as below: Advised per chart note from PCP Pls call pt and let her know I sent in a new inhaler in place of the Anoro.  If any cost challenges, please let me know Patient verbalized understanding, states she will contact pharmacy and reach out if any issues.

## 2024-08-23 ENCOUNTER — Other Ambulatory Visit: Payer: Self-pay | Admitting: Family Medicine

## 2024-08-23 DIAGNOSIS — J449 Chronic obstructive pulmonary disease, unspecified: Secondary | ICD-10-CM

## 2024-08-23 NOTE — Telephone Encounter (Signed)
 Call pt: does she have a nebulizer. The med that go in tht might be cheaper instead of the inhalers

## 2024-08-23 NOTE — Telephone Encounter (Signed)
 Insurance will not cover  Tiotropium Bromide  (SPIRIVA  RESPIMAT) 2.5 MCG/ACT AERS but will cover alternate Incruse Ellipta  62.5. Please send new script to   [id number] [place], [place] - [address] Phone: [phone]  Fax: [phone]

## 2024-08-23 NOTE — Telephone Encounter (Signed)
 Could try to order her a nebulizer and then the medication to go with it if she is open to that.  It gets billed in a different way because otherwise I really do not think there is gena be a resolution to this for what ever reason I think all the inhalers on her Medicare plan this year are in that range.  Plus there is probably a deductible causing it to be this particular price at least on this fill.  Price will likely come down after that but it is probably not going to be cheaper to send in something else.  Other option is she could call her insurance to see if they would make a recommendation for what is cheaper.  They did not give us  any plan alternatives.

## 2024-08-27 MED ORDER — BEVESPI AEROSPHERE 9-4.8 MCG/ACT IN AERO: 2.0000 | INHALATION_SPRAY | Freq: Every day | RESPIRATORY_TRACT | 11 refills | Status: DC

## 2024-08-27 NOTE — Telephone Encounter (Signed)
 I did go ahead and send over a new prescription for Bevespi .  Since they are not covering the Stiolto or the Anoro or the Incruse.  I really think she still can have to call her insurance and find out what they will cover.  We cannot just keep guessing and sending in inhalers.  As far as retesting her, we can absolutely schedule her for spirometry here in the office or we can even schedule her for full pulmonary function if she would like.  She would need to not use her inhaler for the 2 days prior to the test so we can get a true baseline.  The albuterol  cannot be used every day.  It really is a rescue inhaler.

## 2024-08-27 NOTE — Telephone Encounter (Signed)
 Patient wanted me to forward these dates  to Dr. Alvan as well.  01/03/2007- SOB  did metracholine challenge done at [place].  04/10/2007 PFT - started on Symbicort  05/2014 started on Tudorza and proir  Unsure when but was started on Spirivia  And now on Annoro

## 2024-08-28 NOTE — Telephone Encounter (Signed)
 Patient informed and referral sent to cheryl [facility] for medication assistance.

## 2024-08-29 ENCOUNTER — Telehealth: Payer: Self-pay

## 2024-08-29 NOTE — Telephone Encounter (Signed)
 Patient is calling about a new inhaler her and Dr. Alvan discussed. Patient states she is trouble with the inhaler that doesn't cause pr Incruse Elliptal $ copay for 30 days ($364). Advised patient that another inhaler glycopyrrolate formoterol 

## 2024-08-29 NOTE — Progress Notes (Signed)
 Declined Care Guide Pharmacy Note  08/29/2024 Name: [name] MRN: [id number] DOB: August 13, 1932  Referred By: Alvan Dorothyann BIRCH, MD Reason for referral: Call Attempt #1 and Complex Care Management (Outreach to sch ref w/ pharm)   [name] is a 89 y.o. year old female who is a primary care patient of Alvan, Dorothyann BIRCH, MD.  [name] was referred to the pharmacist for assistance related to: COPD  Successful contact was made with the patient to discuss pharmacy services.  Patient declines engagement at this time. Contact information was provided to the patient should they wish to reach out for assistance at a later time.  [name] [facility] Guide Direct Dial: [phone]  Fax: [phone]

## 2024-08-29 NOTE — Telephone Encounter (Signed)
 Copied from CRM [id number]. Topic: Clinical - Medication Question >> Aug 29, 2024  9:18 AM Logan F wrote: Reason for CRM: Pt is asking for a call from nurse to explain her medications. She says she is confused about some of them and need help explaining what she is taking.   [phone]

## 2024-08-30 NOTE — Telephone Encounter (Signed)
 Please review the latest MyChart from the patient regarding her inhaler medication. Thanks in advance.

## 2024-09-02 ENCOUNTER — Other Ambulatory Visit: Payer: Self-pay

## 2024-09-02 DIAGNOSIS — J449 Chronic obstructive pulmonary disease, unspecified: Secondary | ICD-10-CM

## 2024-09-02 DIAGNOSIS — J45990 Exercise induced bronchospasm: Secondary | ICD-10-CM

## 2024-09-02 MED ORDER — ALBUTEROL SULFATE HFA 108 (90 BASE) MCG/ACT IN AERS: INHALATION_SPRAY | RESPIRATORY_TRACT | 99 refills | Status: AC

## 2024-09-03 MED ORDER — INCRUSE ELLIPTA 62.5 MCG/ACT IN AEPB: 1.0000 | INHALATION_SPRAY | Freq: Every day | RESPIRATORY_TRACT | 11 refills | Status: AC

## 2024-09-03 NOTE — Addendum Note (Signed)
 Addended by: Sharica Roedel D on: 09/03/2024 09:06 AM   Modules accepted: Orders

## 2024-09-03 NOTE — Telephone Encounter (Signed)
 Spoke with pharmacy - was told that Incruse Ellipta  was available for pickup - cost is $103.01  Patient and husband informed. Also informed that albuterol  was sent to pharmacy yesterday .   Regarding question about BP medication this was for the patient' s husband ( hydrochlorothiazide ) and informed that this was sent to pharmacy on 08/30/2024  Due to inclement weather - they plan to pick up these prescriptions tomorrow 09/04/2024

## 2025-02-19 ENCOUNTER — Ambulatory Visit: Admitting: Family Medicine
